# Patient Record
Sex: Female | Born: 1990 | Race: White | Hispanic: No | Marital: Single | Smoking: Current every day smoker
Health system: Southern US, Community
[De-identification: ages and names within clinical notes are randomized; demographics above are authoritative.]

## PROBLEM LIST (undated history)

## (undated) DIAGNOSIS — R000 Tachycardia, unspecified: Secondary | ICD-10-CM

## (undated) DIAGNOSIS — Z91410 Personal history of adult physical and sexual abuse: Secondary | ICD-10-CM

## (undated) DIAGNOSIS — F319 Bipolar disorder, unspecified: Secondary | ICD-10-CM

## (undated) DIAGNOSIS — F1911 Other psychoactive substance abuse, in remission: Secondary | ICD-10-CM

## (undated) DIAGNOSIS — Z8659 Personal history of other mental and behavioral disorders: Secondary | ICD-10-CM

## (undated) DIAGNOSIS — F502 Bulimia nervosa, unspecified: Secondary | ICD-10-CM

## (undated) DIAGNOSIS — R011 Cardiac murmur, unspecified: Secondary | ICD-10-CM

## (undated) DIAGNOSIS — D649 Anemia, unspecified: Secondary | ICD-10-CM

## (undated) DIAGNOSIS — R45851 Suicidal ideations: Secondary | ICD-10-CM

## (undated) DIAGNOSIS — F32A Depression, unspecified: Secondary | ICD-10-CM

## (undated) DIAGNOSIS — B977 Papillomavirus as the cause of diseases classified elsewhere: Secondary | ICD-10-CM

## (undated) DIAGNOSIS — Z973 Presence of spectacles and contact lenses: Secondary | ICD-10-CM

## (undated) DIAGNOSIS — F329 Major depressive disorder, single episode, unspecified: Secondary | ICD-10-CM

## (undated) DIAGNOSIS — F1010 Alcohol abuse, uncomplicated: Secondary | ICD-10-CM

## (undated) DIAGNOSIS — K219 Gastro-esophageal reflux disease without esophagitis: Secondary | ICD-10-CM

## (undated) DIAGNOSIS — F4310 Post-traumatic stress disorder, unspecified: Secondary | ICD-10-CM

## (undated) HISTORY — DX: Papillomavirus as the cause of diseases classified elsewhere: B97.7

## (undated) HISTORY — DX: Cardiac murmur, unspecified: R01.1

## (undated) HISTORY — DX: Personal history of other mental and behavioral disorders: Z86.59

## (undated) HISTORY — DX: Post-traumatic stress disorder, unspecified: F43.10

## (undated) HISTORY — DX: Other psychoactive substance abuse, in remission: F19.11

## (undated) HISTORY — PX: COLPOSCOPY: SHX161

## (undated) HISTORY — DX: Personal history of adult physical and sexual abuse: Z91.410

## (undated) HISTORY — DX: Alcohol abuse, uncomplicated: F10.10

## (undated) HISTORY — DX: Bulimia nervosa: F50.2

## (undated) HISTORY — DX: Bulimia nervosa, unspecified: F50.20

## (undated) HISTORY — PX: WISDOM TOOTH EXTRACTION: SHX21

---
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
## 2008-02-17 ENCOUNTER — Other Ambulatory Visit: Admission: RE | Admit: 2008-02-17 | Discharge: 2008-02-17 | Payer: Self-pay | Admitting: Family Medicine

## 2012-04-05 ENCOUNTER — Emergency Department (HOSPITAL_COMMUNITY)
Admission: EM | Admit: 2012-04-05 | Discharge: 2012-04-05 | Disposition: A | Discharge status: Home / Self Care | Payer: BC Managed Care – PPO | Source: Home / Self Care | Attending: Emergency Medicine | Admitting: Emergency Medicine

## 2012-04-05 ENCOUNTER — Encounter (HOSPITAL_COMMUNITY): Payer: Self-pay | Admitting: Emergency Medicine

## 2012-04-05 DIAGNOSIS — IMO0002 Reserved for concepts with insufficient information to code with codable children: Secondary | ICD-10-CM

## 2012-04-05 DIAGNOSIS — S86899A Other injury of other muscle(s) and tendon(s) at lower leg level, unspecified leg, initial encounter: Secondary | ICD-10-CM

## 2012-04-05 HISTORY — DX: Major depressive disorder, single episode, unspecified: F32.9

## 2012-04-05 HISTORY — DX: Depression, unspecified: F32.A

## 2012-04-05 MED ORDER — MELOXICAM 7.5 MG PO TABS: 7.5000 mg | ORAL_TABLET | Freq: Every day | ORAL | Status: DC

## 2012-04-05 NOTE — ED Provider Notes (Signed)
History     CSN: [id number]  Arrival date & time 04/05/12  1230   First MD Initiated Contact with Patient 04/05/12 1253      Chief Complaint  Patient presents with  . Leg Pain    (Consider location/radiation/quality/duration/timing/severity/associated sxs/prior treatment) HPI Comments: Patient presents to urgent care this afternoon complaining of right leg pain (patient points towards the anterior aspect of her right Tibial  Area) patient denies any injury, falls or recent gestures that could have explained a sudden pain in this area. She denies any swelling, calf pain, changes in color. 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  Patient is a 21 y.o. female presenting with leg pain. The history is provided by the patient.  Leg Pain  The incident occurred more than 2 days ago. The incident occurred at work. There was no injury mechanism. The pain is present in the right leg. The pain is at a severity of 5/10. The pain is moderate. The pain has been constant since onset. Associated symptoms include tingling. Pertinent negatives include no numbness, no loss of motion and no loss of sensation. Exacerbated by: Walking and activity. She has tried nothing for the symptoms. The treatment provided no relief.    Past Medical History  Diagnosis Date  . Depression     Past Surgical History  Procedure Date  . Wisdom tooth extraction     Family History  Problem Relation Age of Onset  . Cancer Mother   . Diabetes Father   . Hypertension Father   . Asthma Sister     History  Substance Use Topics  . Smoking status: Current Everyday Smoker    Types: Cigarettes  . Smokeless tobacco: Not on file  . Alcohol Use: 0.0 oz/week     daily    OB History    Grav Para Term Preterm Abortions TAB SAB Ect Mult Living                  Review of Systems  Constitutional: Negative for fever, diaphoresis, activity  change and appetite change.  Skin: Negative for color change, pallor, rash and wound.  Neurological: Positive for tingling. Negative for weakness and numbness.    Allergies  Review of patient's allergies indicates no known allergies.  Home Medications   Current Outpatient Rx  Name Route Sig Dispense Refill  . ESCITALOPRAM OXALATE 5 MG PO TABS Oral Take 5 mg by mouth daily.    Marland Kitchen ZOLPIDEM TARTRATE 5 MG PO TABS Oral Take 5 mg by mouth at bedtime as needed.    . MELOXICAM 7.5 MG PO TABS Oral Take 1 tablet (7.5 mg total) by mouth daily. 14 tablet 0    BP 120/80  Pulse 90  Temp 98.3 F (36.8 C) (Oral)  Resp 18  SpO2 98%  LMP 03/14/2012  Physical Exam  Nursing note and vitals reviewed. Constitutional: Vital signs are normal. She appears well-developed and well-nourished.    Musculoskeletal: She exhibits tenderness. She exhibits no edema.  Skin: No rash noted. No erythema.    ED Course  Procedures (including critical care time)  Labs Reviewed - No data to display No results found.   1. Shin splints       MDM  Patient right anterior T. feel focal discomfort. Consistent with a shin splint. No abnormalities were noted on her exam such as circumferential diameter increase, erythema or localized soft tissue swelling. Have  encouraged patient to take a meloxicam course for 2 weeks and to use an Ace wrap when she's working to provide minimal pressure. Was advised to followup with sports medicine orthopedic service if pain to increase or worsen despite this recommended measures.        Beth Molly, MD 04/05/12 2043

## 2012-04-05 NOTE — ED Notes (Signed)
Pt c/o right leg pain below the knee that started 8/19; denies injury; no swelling or redness noted to leg; pulses present in right foot.

## 2012-06-14 HISTORY — PX: OTHER SURGICAL HISTORY: SHX169

## 2012-09-14 HISTORY — PX: OTHER SURGICAL HISTORY: SHX169

## 2012-09-19 ENCOUNTER — Other Ambulatory Visit: Payer: Self-pay | Admitting: Obstetrics and Gynecology

## 2012-09-19 ENCOUNTER — Ambulatory Visit (HOSPITAL_COMMUNITY)
Admission: RE | Admit: 2012-09-19 | Discharge: 2012-09-19 | Disposition: A | Discharge status: Home / Self Care | Payer: BC Managed Care – PPO | Source: Ambulatory Visit | Attending: Obstetrics and Gynecology | Admitting: Obstetrics and Gynecology

## 2012-09-19 DIAGNOSIS — R58 Hemorrhage, not elsewhere classified: Secondary | ICD-10-CM

## 2012-09-19 DIAGNOSIS — IMO0002 Reserved for concepts with insufficient information to code with codable children: Secondary | ICD-10-CM | POA: Insufficient documentation

## 2012-11-21 ENCOUNTER — Ambulatory Visit: Payer: Self-pay | Admitting: Obstetrics and Gynecology

## 2012-11-22 ENCOUNTER — Ambulatory Visit: Payer: Self-pay | Admitting: Obstetrics and Gynecology

## 2012-11-25 ENCOUNTER — Ambulatory Visit (INDEPENDENT_AMBULATORY_CARE_PROVIDER_SITE_OTHER): Payer: BC Managed Care – PPO | Admitting: Obstetrics and Gynecology

## 2012-11-25 ENCOUNTER — Encounter: Payer: Self-pay | Admitting: Obstetrics and Gynecology

## 2012-11-25 VITALS — BP 100/64 | Ht 62.0 in | Wt 113.0 lb

## 2012-11-25 DIAGNOSIS — Z01419 Encounter for gynecological examination (general) (routine) without abnormal findings: Secondary | ICD-10-CM
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
## 2012-11-25 NOTE — Patient Instructions (Signed)

## 2012-11-26 NOTE — Addendum Note (Signed)
Addended by: Clide Dales R on: 11/26/2012 02:22 PM   Modules accepted: Orders

## 2012-11-28 LAB — HEMOGLOBIN, FINGERSTICK: Hemoglobin, fingerstick: 13.9 g/dL (ref 12.0–16.0)

## 2012-12-23 ENCOUNTER — Encounter: Payer: Self-pay | Admitting: Obstetrics and Gynecology

## 2012-12-23 ENCOUNTER — Telehealth: Payer: Self-pay | Admitting: Obstetrics and Gynecology

## 2012-12-23 ENCOUNTER — Ambulatory Visit (INDEPENDENT_AMBULATORY_CARE_PROVIDER_SITE_OTHER): Payer: BC Managed Care – PPO | Admitting: Obstetrics and Gynecology

## 2012-12-23 VITALS — BP 100/68 | Wt 113.0 lb

## 2012-12-23 DIAGNOSIS — N6310 Unspecified lump in the right breast, unspecified quadrant: Secondary | ICD-10-CM

## 2012-12-23 DIAGNOSIS — N926 Irregular menstruation, unspecified: Secondary | ICD-10-CM

## 2012-12-23 DIAGNOSIS — N63 Unspecified lump in unspecified breast: Secondary | ICD-10-CM

## 2012-12-23 LAB — CBC
Hemoglobin: 13.2 g/dL (ref 12.0–15.0)
MCH: 29.3 pg (ref 26.0–34.0)
RBC: 4.51 MIL/uL (ref 3.87–5.11)
WBC: 6.7 10*3/uL (ref 4.0–10.5)

## 2012-12-23 MED ORDER — ESTRADIOL 1 MG PO TABS: 2.0000 mg | ORAL_TABLET | Freq: Every day | ORAL | Status: DC

## 2012-12-23 NOTE — Progress Notes (Signed)
Patient ID: Beth [name], female   DOB: 12-07-1990, 22 y.o.   MRN: [id number]  Subjective  22 year old G7P0010 Caucasian female with Nexplanon inserted in November who presents with bleeding since last office visit 11/25/12.  Heavy bleeding and clotting started 5 days ago.  Some cramping.  No pain medication used.  Had a normal pelvic ultrasound on 09/19/12, which was ordered in follow up to a dilation and curettage performed in [place] for retained products of conception following a pregnancy termination.  Last sexual activity 2 weeks ago.  Female partner.  Not using condoms.  Steady partner for three years.  Latex allergy.  Has used OCPS in the past.  Has not used the NuvaRing.    Noted a right breast lump last week.  No history of fibrocystic breasts.    Objective  Breast exam - No dominant masses, retractions, nipple discharge, or axillary adenopathy bilaterally. Pelvic exam - erythema of vulva (outlines contact are for sanitary pad).  Cervix and vagina without lesions.  Small amount of red blood in the vagina.  No CMT.  Small and nontender uterus.  No adnexal masses or tenderness.  UPT - negative.  Assessment  Menometrorrhagia. Nexplanon patient. Normal breast exam.  Plan  Estrace 2 mg po daily for 10 days.   CBC now. Patient will call if bleeding does not improve by the end of the course of estrogen.   If bleeding persists, patient will likely ask for removal of the Nexplanon. We have already discussed the possibility of Ortho Evra patch if she decides to have the Nexplanon removed.

## 2012-12-23 NOTE — Telephone Encounter (Signed)
Nexplanon--patient states she has been bleeding for over a month and wants to come in for an appointment. Please advise.

## 2012-12-23 NOTE — Patient Instructions (Signed)

## 2012-12-23 NOTE — Telephone Encounter (Signed)
Spoke with pt who has had Nexplanon since November. Pt states she has had some bleeding for about a month. At the beginning it was spotting, then heavier bleeding for a few days, now it is getting lighter like a normal period. Pt requesting OV. Sched OV today at 3:30 per pt request.

## 2012-12-25 ENCOUNTER — Telehealth: Payer: Self-pay

## 2012-12-25 NOTE — Telephone Encounter (Signed)
LMOVM to call to discuss test results.

## 2012-12-25 NOTE — Telephone Encounter (Signed)
Patient is returning phone call to [place].

## 2012-12-25 NOTE — Telephone Encounter (Signed)
Message copied by Alphonsa Overall on Wed Dec 25, 2012 10:21 AM ------      Message from: Conley Simmonds      Created: Tue Dec 24, 2012  6:40 PM       Please report normal hemoglobin to patient.  No anemia. ------

## 2012-12-25 NOTE — Telephone Encounter (Signed)
Pt. Notified hemoglobin normal.

## 2013-01-08 NOTE — Telephone Encounter (Signed)
LEFT MESSAGE ON CB# OF NEED TO RETURN CALL CONCERNING OCT.

## 2013-01-08 NOTE — Telephone Encounter (Signed)
Pt wants to switch B/C

## 2013-01-09 NOTE — Telephone Encounter (Signed)
PATIENT STATES ESTROGEN PILLS HELPED FOR ABOUT 4 DAYS AND THEN THE BLEEDING VAGINALLY STARTED AGAIN AND IS WORSE WITH CRAMPING . APPOINTMENT MADE WITH D. LEONARD ON Tuesday June 3RD. SUE

## 2013-01-14 ENCOUNTER — Ambulatory Visit (INDEPENDENT_AMBULATORY_CARE_PROVIDER_SITE_OTHER): Payer: BC Managed Care – PPO | Admitting: Certified Nurse Midwife

## 2013-01-14 VITALS — BP 90/60 | HR 72 | Resp 16

## 2013-01-14 DIAGNOSIS — N938 Other specified abnormal uterine and vaginal bleeding: Secondary | ICD-10-CM

## 2013-01-14 DIAGNOSIS — Z3009 Encounter for other general counseling and advice on contraception: Secondary | ICD-10-CM

## 2013-01-14 DIAGNOSIS — N949 Unspecified condition associated with female genital organs and menstrual cycle: Secondary | ICD-10-CM

## 2013-01-14 NOTE — Progress Notes (Signed)
22 yo. g1 p0010 single white female here for discussion of removing Nexplanon and discussing other option for contraception.Continues bleeding with Nexplanon which was inserted 11-13, not happy with bleeding profile.  Had retained POC after pregnancy termination with D&C done  In [place]. Patient was anemic at that time, but has resolved.  Was treated 2 weeks ago for excessive bleeding again with Estrace 2 mg daily for 10 days. Bleeding did change for four days with decrease near the end of usage. Patient now at normal  period time with lighter flow right now. Interested in having Nexplanon removed and possibly using Ortho Evra patch, not interested in Nuvaring and had non compliance with pills which resulted in pregnancy.  O: Healthy female WD WN Affect: normal, orientation x 3 Declines pelvic exam ( normal exam 12-23-12 here)  A: Contraception Nexplanon with continued DUB with some anemia, corrected with iron supplementation. 2-Trial of Estrace 2mg  with some change in DUB 3-Desires other options for contraception  P: Discussed trial of Doxycycline which has been used for treatment of DUB with Nexplanon and effect on uterine lining. Patient declines at this point.  Wants to see if period will change at the end of this cycle. 3-Discussed Ortho Evra risks and benefits with hand out and need to be compliant to avoid unwanted pregnancy. Given handout.  Patient plans to continue with current method and assess bleeding, if continues, will schedule removal of nexplanon and start Ortho Evra.   Rv prn  36 minutes spent with patient with >50% of time spent in face to face counseling.  Reviewed, TL

## 2013-04-21 ENCOUNTER — Telehealth: Payer: Self-pay | Admitting: Certified Nurse Midwife

## 2013-04-21 NOTE — Telephone Encounter (Signed)
Spoke with pt who is ready to have Nexplanon removed and start Ortho Evra patch. Advised DL does not remove Nexplanon, and pt agreeable to see BS. Advised Carolynn from insurance would be calling to let her know OOP cost. Scheduled appt 04-24-13 at 8 am. Pt wondering if she can work that day after having it done. Advised pt it is fairly simple to remove, with a local injection to numb the site and a stitch perhaps to close it with a band aid over it. Advised pt she should be able to work that day. Pt agreeable.

## 2013-04-21 NOTE — Telephone Encounter (Signed)
LVM advising $35 copay for nexplanon removal.

## 2013-04-21 NOTE — Telephone Encounter (Signed)
Patient has been on ger period for 2 weeks and wants an appointment to remove Nexplanon. Patient wants to Beth [name].

## 2013-04-24 ENCOUNTER — Encounter: Payer: Self-pay | Admitting: Obstetrics and Gynecology

## 2013-04-24 ENCOUNTER — Ambulatory Visit (INDEPENDENT_AMBULATORY_CARE_PROVIDER_SITE_OTHER): Payer: BC Managed Care – PPO | Admitting: Obstetrics and Gynecology

## 2013-04-24 VITALS — BP 100/66 | HR 88 | Ht 62.0 in | Wt 113.0 lb

## 2013-04-24 DIAGNOSIS — N921 Excessive and frequent menstruation with irregular cycle: Secondary | ICD-10-CM

## 2013-04-24 LAB — POCT URINE PREGNANCY: Preg Test, Ur: NEGATIVE

## 2013-04-24 MED ORDER — ETONOGESTREL-ETHINYL ESTRADIOL 0.12-0.015 MG/24HR VA RING: VAGINAL_RING | VAGINAL | Status: DC

## 2013-04-24 NOTE — Addendum Note (Signed)
Addended by: Alphonsa Overall on: 04/24/2013 09:32 AM   Modules accepted: Orders
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
## 2013-04-24 NOTE — Patient Instructions (Signed)

## 2013-07-24 ENCOUNTER — Ambulatory Visit (INDEPENDENT_AMBULATORY_CARE_PROVIDER_SITE_OTHER): Payer: BC Managed Care – PPO | Admitting: Obstetrics and Gynecology

## 2013-07-24 ENCOUNTER — Encounter: Payer: Self-pay | Admitting: Obstetrics and Gynecology

## 2013-07-24 VITALS — BP 100/60 | HR 100 | Ht 62.0 in | Wt 118.0 lb

## 2013-07-24 DIAGNOSIS — Z3040 Encounter for surveillance of contraceptives, unspecified: Secondary | ICD-10-CM

## 2013-07-24 MED ORDER — ETONOGESTREL-ETHINYL ESTRADIOL 0.12-0.015 MG/24HR VA RING: VAGINAL_RING | VAGINAL | Status: DC

## 2013-07-24 NOTE — Progress Notes (Signed)
Patient ID: CINDEL DAUGHERTY, female   DOB: [birth year], 22 y.o.   MRN: [id number]  Subjective  Patient is here for a recheck. Nexplanon removed 3 months ago due to metrorrhagia. Patient started NuvaRing and is very satisfied with her bleeding profile now.  LMP 07/23/13. Patient states she has her menses for four days per month.  Menses last for four days.   Patient likes the predictability of menses. Had heavy bleeding last night and stained the sheets.  Heavy for only one or two days. Pad change two or three times a day. Some cramping. Does not need medication.  Sexually active. Ring does not come out.   BP 100/60  P 100  No exam.  Assessment  Metrorrhagia, resolved. Doing well on NuvaRing.  Plan  Continue to use NuvaRing as directed.  Refills for 6 months in Epic. Can try NSAID, Aleve or Advil during menses to reduce menstrual flow. Follow up in April for annual exam.  10 minutes face to face time of which over 50% was spent in counseling.

## 2013-11-26 ENCOUNTER — Encounter: Payer: Self-pay | Admitting: Obstetrics and Gynecology

## 2013-11-26 ENCOUNTER — Ambulatory Visit (INDEPENDENT_AMBULATORY_CARE_PROVIDER_SITE_OTHER): Payer: BC Managed Care – PPO | Admitting: Obstetrics and Gynecology

## 2013-11-26 VITALS — BP 118/72 | HR 100 | Resp 18 | Ht 62.0 in | Wt 119.0 lb

## 2013-11-26 DIAGNOSIS — Z01419 Encounter for gynecological examination (general) (routine) without abnormal findings: Secondary | ICD-10-CM

## 2013-11-26 DIAGNOSIS — Z309 Encounter for contraceptive management, unspecified: Secondary | ICD-10-CM

## 2013-11-26 DIAGNOSIS — Z0000 Encounter for general adult medical examination without abnormal findings: Secondary | ICD-10-CM

## 2013-11-26 DIAGNOSIS — Z113 Encounter for screening for infections with a predominantly sexual mode of transmission: Secondary | ICD-10-CM

## 2013-11-26 DIAGNOSIS — R319 Hematuria, unspecified: Secondary | ICD-10-CM

## 2013-11-26 LAB — POCT URINALYSIS DIPSTICK
BILIRUBIN UA: NEGATIVE
Glucose, UA: NEGATIVE
KETONES UA: NEGATIVE
Leukocytes, UA: NEGATIVE
Nitrite, UA: NEGATIVE
PH UA: 5
Protein, UA: NEGATIVE
Urobilinogen, UA: NEGATIVE

## 2013-11-26 LAB — POCT URINE PREGNANCY: Preg Test, Ur: NEGATIVE

## 2013-11-26 LAB — HEMOGLOBIN, FINGERSTICK: Hemoglobin, fingerstick: 13.8 g/dL (ref 12.0–16.0)

## 2013-11-26 MED ORDER — ETONOGESTREL-ETHINYL ESTRADIOL 0.12-0.015 MG/24HR VA RING: VAGINAL_RING | VAGINAL | Status: DC
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
## 2013-11-26 NOTE — Patient Instructions (Signed)

## 2013-11-27 ENCOUNTER — Ambulatory Visit: Payer: BC Managed Care – PPO | Admitting: Obstetrics and Gynecology

## 2013-11-27 LAB — URINE CULTURE
Colony Count: NO GROWTH
ORGANISM ID, BACTERIA: NO GROWTH

## 2013-11-27 LAB — GC/CHLAMYDIA PROBE AMP, URINE
CHLAMYDIA, SWAB/URINE, PCR: NEGATIVE
GC PROBE AMP, URINE: NEGATIVE

## 2013-11-27 LAB — URINALYSIS, MICROSCOPIC ONLY
Bacteria, UA: NONE SEEN
CASTS: NONE SEEN
SQUAMOUS EPITHELIAL / LPF: NONE SEEN

## 2013-11-27 LAB — STD PANEL
HEP B S AG: NEGATIVE
HIV: NONREACTIVE

## 2013-11-27 LAB — HEPATITIS C ANTIBODY: HCV Ab: NEGATIVE

## 2013-11-28 LAB — IPS PAP TEST WITH REFLEX TO HPV

## 2014-05-11 IMAGING — US US TRANSVAGINAL NON-OB
1 series · 13 of 25 positions shown · non-contrast
Comparison: None

CLINICAL DATA: Status post abortion on 06/20/2012 with persistent
bleeding and emergency D&C on 09/14/2012 for presumed retained
products of conception.



[Series 1: us pelvis complete · 13 of 45 slices shown]
[im 1/45]
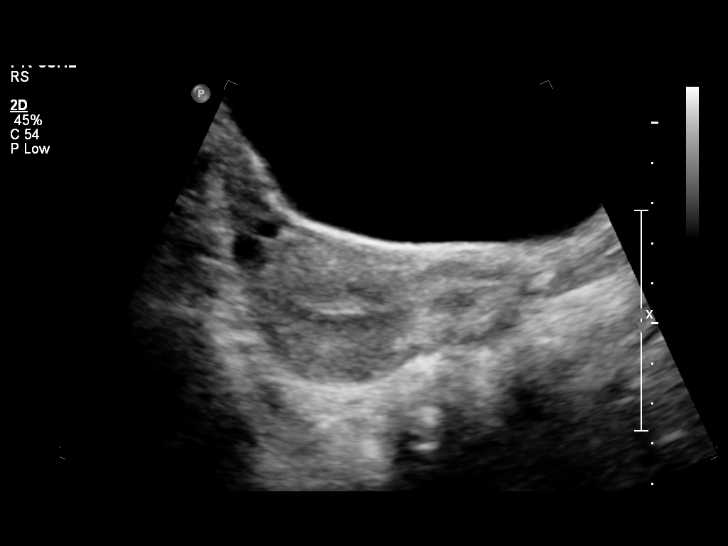
[im 4/45]
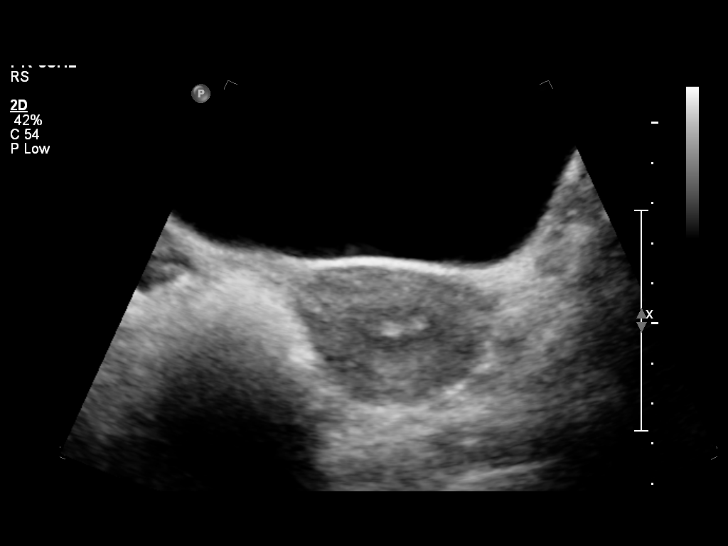
[im 8/45]
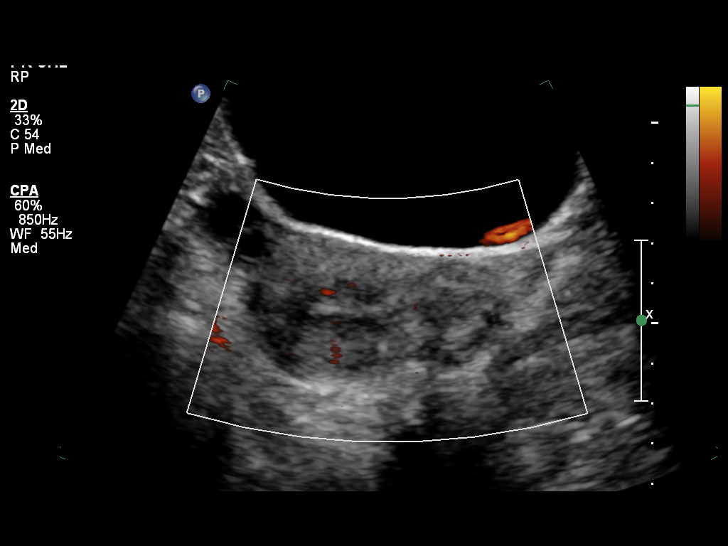
[im 12/45]
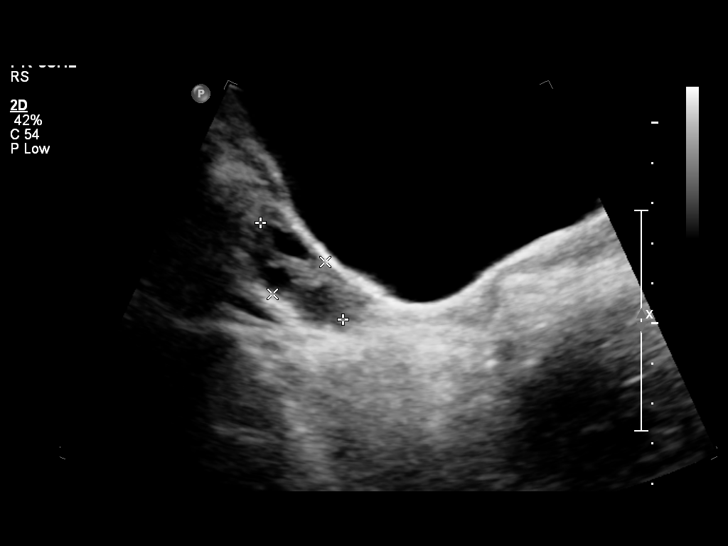
[im 15/45]
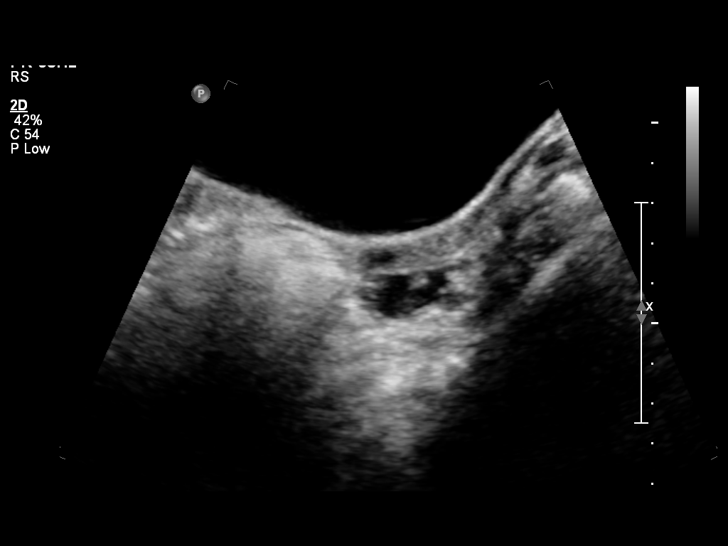
[im 19/45]
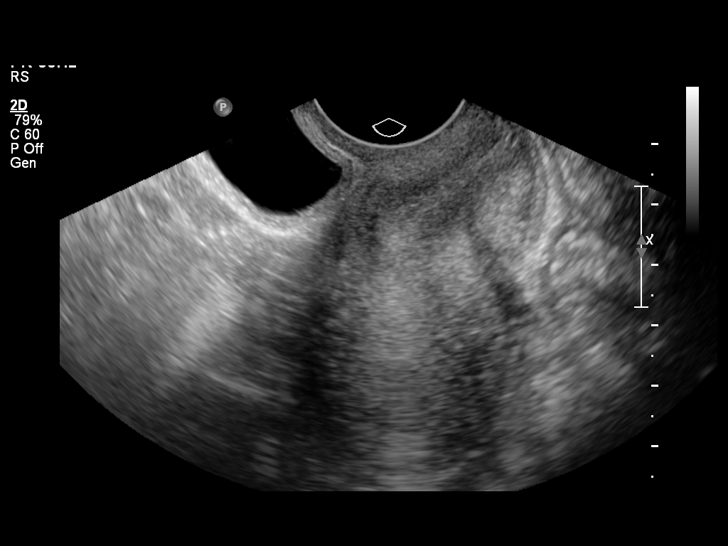
[im 23/45]
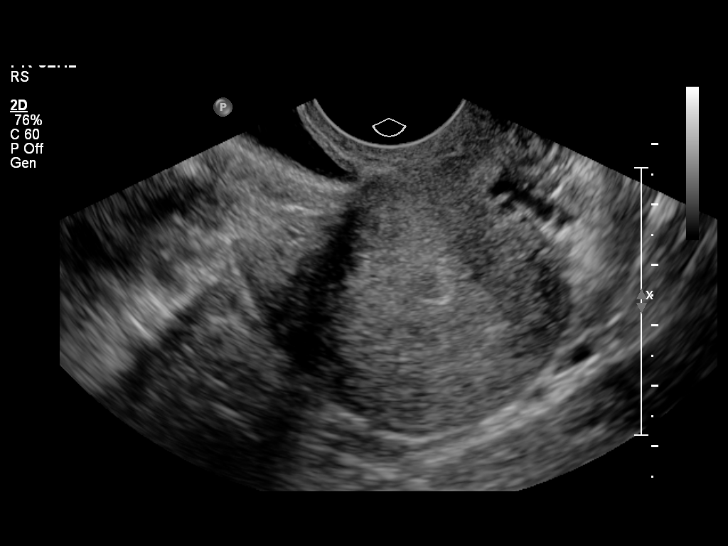
[im 26/45]
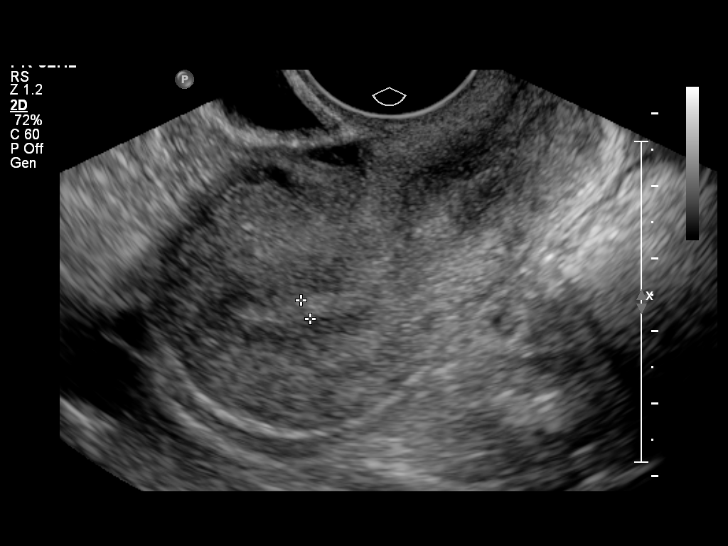
[im 30/45]
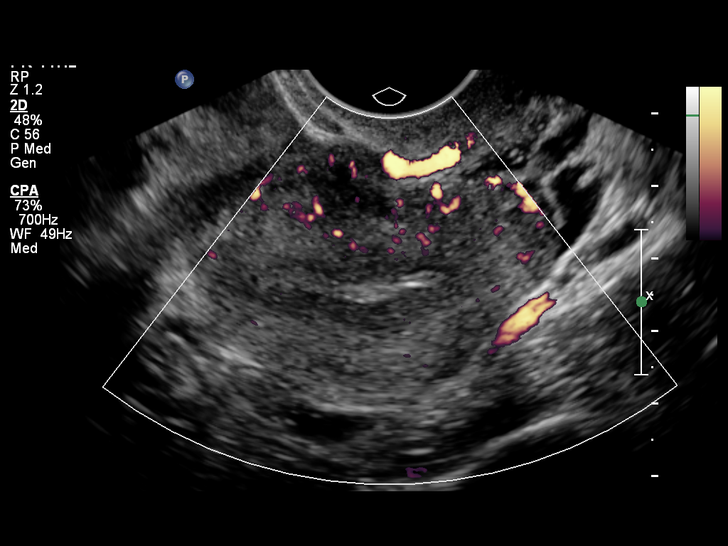
[im 34/45]
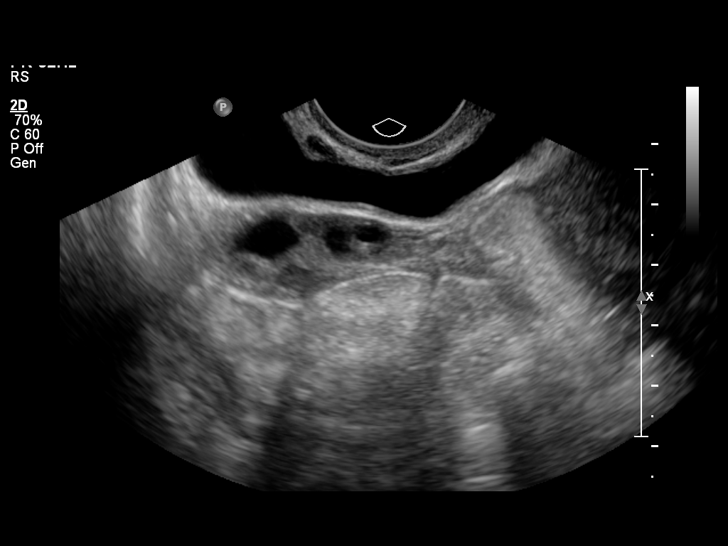
[im 37/45]
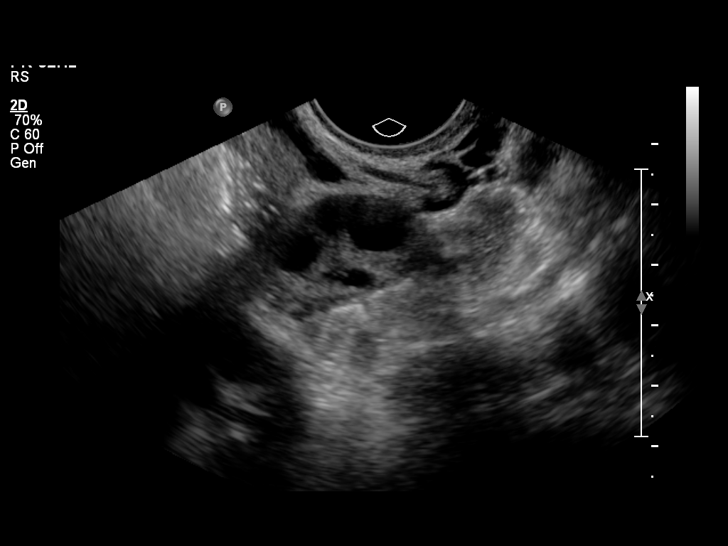
[im 41/45]
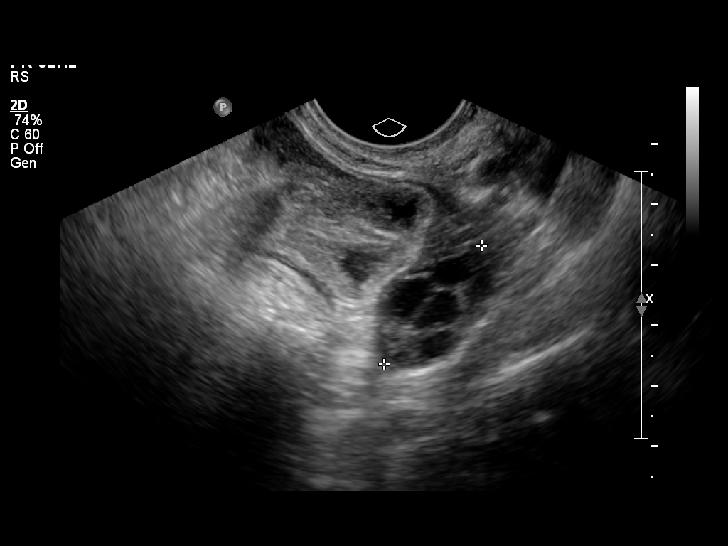
[im 45/45]
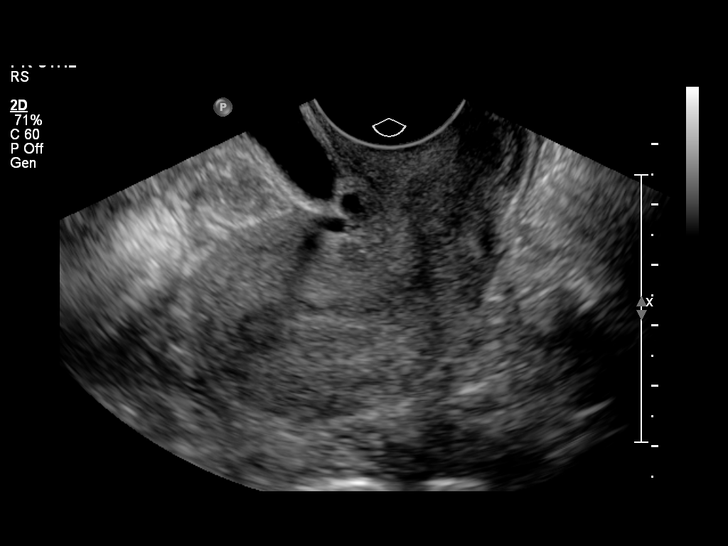

[13 of 25 positions shown; findings below may reference images not displayed]

FINDINGS: Uterus: Is anteverted and anteflexed and demonstrates a sagittal
length of 6.4 cm depth of 3.5 cm and width of 4.6 cm.  A
homogeneous myometrium is seen

Endometrium: Appears thin and echogenic with a width of 2.8 mm.  No
areas of focal thickening or heterogeneity are seen.  The
transitional zone appears maintained

Right ovary:  Measures 2.8 x 1.8 x 2.6 cm and has a normal
appearance

Left ovary: Measures 2.7 x 1.6 x 2.5 cm and has a normal appearance

Other findings: No pelvic fluid or separate adnexal masses are
seen.
IMPRESSION: Normal pelvic ultrasound with no focal endometrial abnormality
noted..

## 2014-06-10 ENCOUNTER — Telehealth: Payer: Self-pay | Admitting: Obstetrics and Gynecology

## 2014-06-10 NOTE — Telephone Encounter (Signed)
Left msg to call regarding cancelled appt.

## 2014-06-15 ENCOUNTER — Encounter: Payer: Self-pay | Admitting: Obstetrics and Gynecology

## 2014-12-02 ENCOUNTER — Ambulatory Visit: Payer: BC Managed Care – PPO | Admitting: Obstetrics and Gynecology

## 2014-12-03 ENCOUNTER — Ambulatory Visit: Payer: BC Managed Care – PPO | Admitting: Obstetrics and Gynecology

## 2014-12-03 ENCOUNTER — Telehealth: Payer: Self-pay | Admitting: Obstetrics and Gynecology

## 2014-12-03 NOTE — Telephone Encounter (Signed)
Patient rescheduled her aex appointment today with Dr.Silva, staff msg sent to Dr.Silva.

## 2014-12-07 ENCOUNTER — Other Ambulatory Visit: Payer: Self-pay | Admitting: Obstetrics and Gynecology

## 2014-12-07 NOTE — Telephone Encounter (Signed)
Medication refill request: Nuvaring Last AEX:  11/26/13 Dr. Edward JollySilva Next AEX: 12/16/14 Dr. Edward JollySilva Last MMG (if hormonal medication request): none Refill authorized: 11/26/13 #3each/ 3 Refills. Today  #1each/ 0R?

## 2014-12-16 ENCOUNTER — Ambulatory Visit: Payer: BC Managed Care – PPO | Admitting: Obstetrics and Gynecology

## 2014-12-16 ENCOUNTER — Other Ambulatory Visit: Payer: Self-pay | Admitting: Obstetrics and Gynecology

## 2014-12-16 NOTE — Telephone Encounter (Signed)
Patient cancelled her AEX at her appointment today. She rescheduled to 02/24/15 with Dr. Edward JollySilva. Separate staff message sent to Dr. Edward JollySilva. Okay to close encounter?

## 2014-12-16 NOTE — Telephone Encounter (Signed)
12/07/14 #1 ring/0 rfs was sent to pharmacy-Denied.

## 2015-01-12 ENCOUNTER — Other Ambulatory Visit: Payer: Self-pay | Admitting: Obstetrics and Gynecology

## 2015-01-12 NOTE — Telephone Encounter (Signed)
Patient needs to keep her appointment or she will not receive any further refills.  She has cancelled several appointments.

## 2015-01-12 NOTE — Telephone Encounter (Signed)
Medication refill request: Nuvaring  Last AEX:  11/26/13 Dr. Edward JollySilva Next AEX: 02/24/15 Dr. Edward JollySilva Last MMG (if hormonal medication request): None Refill authorized: 12/07/14 #1 each w/0R. Today #1 /1R?

## 2015-01-12 NOTE — Telephone Encounter (Signed)
Routed to Dr. Miller

## 2015-01-12 NOTE — Telephone Encounter (Signed)
Patient calling to check status of refill. Patient requesting ASAP.

## 2015-01-13 NOTE — Telephone Encounter (Signed)
Left Voicemail for pt with message bellow - [facility]

## 2015-02-24 ENCOUNTER — Ambulatory Visit (INDEPENDENT_AMBULATORY_CARE_PROVIDER_SITE_OTHER): Payer: BC Managed Care – PPO | Admitting: Obstetrics and Gynecology

## 2015-02-24 ENCOUNTER — Telehealth: Payer: Self-pay | Admitting: Obstetrics and Gynecology

## 2015-02-24 NOTE — Telephone Encounter (Signed)
[facility]   Cc- Starla Curl

## 2015-02-24 NOTE — Telephone Encounter (Signed)
Patient cancelled appointment. Staff message sent to Dr Edward JollySilva.

## 2015-02-25 ENCOUNTER — Ambulatory Visit: Payer: BC Managed Care – PPO | Admitting: Obstetrics and Gynecology

## 2015-02-25 ENCOUNTER — Other Ambulatory Visit: Payer: Self-pay | Admitting: Obstetrics and Gynecology

## 2015-02-25 MED ORDER — ETONOGESTREL-ETHINYL ESTRADIOL 0.12-0.015 MG/24HR VA RING: 1.0000 | VAGINAL_RING | VAGINAL | Status: DC

## 2015-02-25 NOTE — Telephone Encounter (Signed)
Patient calling requesting a refill on her birth control. Separate staff message to Dr. Edward JollySilva.

## 2015-02-25 NOTE — Telephone Encounter (Signed)
Medication refill request: Nuvaring Last AEX:  11/26/13 with BS Next AEX: No AEX scheduled  Last MMG (if hormonal medication request): n/a Refill authorized: Please advise.

## 2015-02-25 NOTE — Telephone Encounter (Signed)
LM on patient's vm that rx has been [facility].  Encounter closed.

## 2015-02-25 NOTE — Telephone Encounter (Signed)
I already put in a refill for one month of NuvaRing.

## 2015-03-04 ENCOUNTER — Telehealth: Payer: Self-pay | Admitting: Nurse Practitioner

## 2015-03-04 ENCOUNTER — Encounter: Payer: Self-pay | Admitting: Obstetrics and Gynecology

## 2015-03-04 ENCOUNTER — Other Ambulatory Visit: Payer: Self-pay | Admitting: Obstetrics and Gynecology

## 2015-03-04 ENCOUNTER — Ambulatory Visit (INDEPENDENT_AMBULATORY_CARE_PROVIDER_SITE_OTHER): Payer: BLUE CROSS/BLUE SHIELD | Admitting: Obstetrics and Gynecology

## 2015-03-04 VITALS — BP 110/78 | HR 104 | Resp 14 | Ht 61.75 in | Wt 130.0 lb

## 2015-03-04 DIAGNOSIS — Z01419 Encounter for gynecological examination (general) (routine) without abnormal findings: Secondary | ICD-10-CM

## 2015-03-04 DIAGNOSIS — N762 Acute vulvitis: Secondary | ICD-10-CM | POA: Diagnosis not present

## 2015-03-04 DIAGNOSIS — Z3049 Encounter for surveillance of other contraceptives: Secondary | ICD-10-CM | POA: Diagnosis not present

## 2015-03-04 DIAGNOSIS — Z0000 Encounter for general adult medical examination without abnormal findings: Secondary | ICD-10-CM

## 2015-03-04 DIAGNOSIS — Z113 Encounter for screening for infections with a predominantly sexual mode of transmission: Secondary | ICD-10-CM

## 2015-03-04 LAB — POCT URINALYSIS DIPSTICK
Bilirubin, UA: NEGATIVE
GLUCOSE UA: NEGATIVE
KETONES UA: NEGATIVE
LEUKOCYTES UA: NEGATIVE
Nitrite, UA: NEGATIVE
PH UA: 7
PROTEIN UA: NEGATIVE
SPEC GRAV UA: 1.025
UROBILINOGEN UA: NEGATIVE

## 2015-03-04 LAB — LIPID PANEL
Cholesterol: 179 mg/dL (ref 0–200)
HDL: 73 mg/dL (ref >=46)
LDL Cholesterol: 65 mg/dL (ref 0–99)
TRIGLYCERIDES: 204 mg/dL — AB (ref <150)
Total CHOL/HDL Ratio: 2.5 Ratio
VLDL: 41 mg/dL — ABNORMAL HIGH (ref 0–40)

## 2015-03-04 LAB — HEPATITIS C ANTIBODY: HCV Ab: NEGATIVE

## 2015-03-04 MED ORDER — BETAMETHASONE VALERATE 0.1 % EX OINT: TOPICAL_OINTMENT | CUTANEOUS | Status: DC

## 2015-03-04 MED ORDER — ETONOGESTREL-ETHINYL ESTRADIOL 0.12-0.015 MG/24HR VA RING: 1.0000 | VAGINAL_RING | VAGINAL | Status: DC

## 2015-03-04 NOTE — Telephone Encounter (Signed)
02/25/15 #1/0 rfs was sent to [facility] [address]- rx denied.

## 2015-03-04 NOTE — Telephone Encounter (Signed)
Made in error please disregard.
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
## 2015-03-04 NOTE — Patient Instructions (Signed)

## 2015-03-05 ENCOUNTER — Telehealth: Payer: Self-pay

## 2015-03-05 LAB — STD PANEL
HIV 1&2 Ab, 4th Generation: NONREACTIVE
Hepatitis B Surface Ag: NEGATIVE

## 2015-03-05 LAB — GC/CHLAMYDIA PROBE AMP
CT Probe RNA: NEGATIVE
GC Probe RNA: NEGATIVE

## 2015-03-05 LAB — WET PREP BY MOLECULAR PROBE
CANDIDA SPECIES: NEGATIVE
GARDNERELLA VAGINALIS: NEGATIVE
TRICHOMONAS VAG: NEGATIVE

## 2015-03-05 MED ORDER — NYSTATIN-TRIAMCINOLONE 100000-0.1 UNIT/GM-% EX CREA: 1.0000 "application " | TOPICAL_CREAM | Freq: Two times a day (BID) | CUTANEOUS | Status: DC

## 2015-03-05 NOTE — Telephone Encounter (Signed)
Spoke with patient. Advised of results as seen below from Dr.Silva. Patient is agreeable and verbalizes understanding. Rx for Mycolog II apply bid for 1 [facility] [address] [facility] request. Patient is agreeable.  Routing to provider for final review. Patient agreeable to disposition. Will close encounter.   Patient aware provider will review message and nurse will return call if any additional advice or change of disposition.

## 2015-03-05 NOTE — Telephone Encounter (Signed)
Affirm is negative.  GC/CT are pending.   OK for Mycolog II which has antifungal and steroid cream in it.  Apply bid for one week.  Dispense one tube, RF none.  Please send to pharmacy of choice.

## 2015-03-05 NOTE — Telephone Encounter (Signed)
Routing to Dr.Silva for review and advise of results from 03/04/2015.

## 2015-03-05 NOTE — Telephone Encounter (Signed)
Left message to call Mohamed Portlock at [phone]. 

## 2015-03-05 NOTE — Telephone Encounter (Signed)
-----   Message from Romualdo Bolk, MD sent at 03/04/2015 12:39 PM EDT ----- Can you please check the results of her wet prep probe tomorrow, she is uncomfortable. You can call me if needed.

## 2015-08-31 ENCOUNTER — Encounter: Payer: Self-pay | Admitting: Obstetrics and Gynecology

## 2015-09-09 ENCOUNTER — Encounter: Payer: Self-pay | Admitting: Obstetrics and Gynecology

## 2015-09-09 ENCOUNTER — Ambulatory Visit (INDEPENDENT_AMBULATORY_CARE_PROVIDER_SITE_OTHER): Payer: BLUE CROSS/BLUE SHIELD | Admitting: Obstetrics and Gynecology

## 2015-09-09 VITALS — BP 98/60 | HR 80 | Resp 16 | Wt 134.0 lb

## 2015-09-09 DIAGNOSIS — Z3009 Encounter for other general counseling and advice on contraception: Secondary | ICD-10-CM | POA: Diagnosis not present

## 2015-09-09 DIAGNOSIS — N898 Other specified noninflammatory disorders of vagina: Secondary | ICD-10-CM | POA: Diagnosis not present

## 2015-09-09 DIAGNOSIS — Z113 Encounter for screening for infections with a predominantly sexual mode of transmission: Secondary | ICD-10-CM

## 2015-09-09 DIAGNOSIS — F1010 Alcohol abuse, uncomplicated: Secondary | ICD-10-CM

## 2015-09-09 DIAGNOSIS — R350 Frequency of micturition: Secondary | ICD-10-CM | POA: Diagnosis not present

## 2015-09-09 DIAGNOSIS — L293 Anogenital pruritus, unspecified: Secondary | ICD-10-CM

## 2015-09-09 LAB — POCT URINALYSIS DIPSTICK
BILIRUBIN UA: NEGATIVE
Glucose, UA: NEGATIVE
KETONES UA: NEGATIVE
Leukocytes, UA: NEGATIVE
Nitrite, UA: NEGATIVE
PH UA: 6.5
Protein, UA: NEGATIVE
RBC UA: NEGATIVE
Urobilinogen, UA: NEGATIVE

## 2015-09-09 MED ORDER — BETAMETHASONE VALERATE 0.1 % EX OINT: 1.0000 "application " | TOPICAL_OINTMENT | Freq: Two times a day (BID) | CUTANEOUS | Status: DC

## 2015-09-09 MED ORDER — MISOPROSTOL 200 MCG PO TABS
ORAL_TABLET | ORAL | Status: DC
Start: 2015-09-09 — End: 2015-10-28

## 2015-09-09 NOTE — Patient Instructions (Signed)
Intrauterine Device Insertion Most often, an intrauterine device (IUD) is inserted into the uterus to prevent pregnancy. There are 2 types of IUDs available:  Copper IUD--This type of IUD creates an environment that is not favorable to sperm survival. The mechanism of action of the copper IUD is not known for certain. It can stay in place for 10 years.  Hormone IUD--This type of IUD contains the hormone progestin (synthetic progesterone). The progestin thickens the cervical mucus and prevents sperm from entering the uterus, and it also thins the uterine lining. There is no evidence that the hormone IUD prevents implantation. One hormone IUD can stay in place for up to 5 years, and a different hormone IUD can stay in place for up to 3 years. An IUD is the most cost-effective birth control if left in place for the full duration. It may be removed at any time. LET YOUR HEALTH CARE PROVIDER KNOW ABOUT:  Any allergies you have.  All medicines you are taking, including vitamins, herbs, eye drops, creams, and over-the-counter medicines.  Previous problems you or members of your family have had with the use of anesthetics.  Any blood disorders you have.  Previous surgeries you have had.  Possibility of pregnancy.  Medical conditions you have. RISKS AND COMPLICATIONS  Generally, intrauterine device insertion is a safe procedure. However, as with any procedure, complications can occur. Possible complications include:  Accidental puncture (perforation) of the uterus.  Accidental placement of the IUD either in the muscle layer of the uterus (myometrium) or outside the uterus. If this happens, the IUD can be found essentially floating around the bowels and must be taken out surgically.  The IUD may fall out of the uterus (expulsion). This is more common in women who have recently had a child.   Pregnancy in the fallopian tube (ectopic).  Pelvic inflammatory disease (PID), which is infection of  the uterus and fallopian tubes. The risk of PID is slightly increased in the first 20 days after the IUD is placed, but the overall risk is still very low. BEFORE THE PROCEDURE  Schedule the IUD insertion for when you will have your menstrual period or right after, to make sure you are not pregnant. Placement of the IUD is better tolerated shortly after a menstrual cycle.  You may need to take tests or be examined to make sure you are not pregnant.  You may be required to take a pregnancy test.  You may be required to get checked for sexually transmitted infections (STIs) prior to placement. Placing an IUD in someone who has an infection can make the infection worse.  You may be given a pain reliever to take 1 or 2 hours before the procedure.  An exam will be performed to determine the size and position of your uterus.  Ask your health care provider about changing or stopping your regular medicines. PROCEDURE   A tool (speculum) is placed in the vagina. This allows your health care provider to see the lower part of the uterus (cervix).  The cervix is prepped with a medicine that lowers the risk of infection.  You may be given a medicine to numb each side of the cervix (intracervical or paracervical block). This is used to block and control any discomfort with insertion.  A tool (uterine sound) is inserted into the uterus to determine the length of the uterine cavity and the direction the uterus may be tilted.  A slim instrument (IUD inserter) is inserted through the cervical   canal and into your uterus.  The IUD is placed in the uterine cavity and the insertion device is removed.  The nylon string that is attached to the IUD and used for eventual IUD removal is trimmed. It is trimmed so that it lays high in the vagina, just outside the cervix. AFTER THE PROCEDURE  You may have bleeding after the procedure. This is normal. It varies from light spotting for a few days to menstrual-like  bleeding.  You may have mild cramping.   This information is not intended to replace advice given to you by your health care provider. Make sure you discuss any questions you have with your health care provider.   Document Released: 03/29/2011 Document Revised: 05/21/2013 Document Reviewed: 01/19/2013 Elsevier Interactive Patient Education 2016 [employer]  
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
## 2015-09-09 NOTE — Addendum Note (Signed)
Addended by: Shelda Jakes E on: 09/09/2015 02:20 PM   Modules accepted: Orders

## 2015-09-10 ENCOUNTER — Telehealth: Payer: Self-pay

## 2015-09-10 LAB — WET PREP BY MOLECULAR PROBE
CANDIDA SPECIES: NEGATIVE
GARDNERELLA VAGINALIS: NEGATIVE
Trichomonas vaginosis: NEGATIVE

## 2015-09-10 LAB — URINALYSIS, MICROSCOPIC ONLY
Bacteria, UA: NONE SEEN [HPF]
CASTS: NONE SEEN [LPF]
Crystals: NONE SEEN [HPF]
WBC, UA: NONE SEEN WBC/HPF (ref <=5)
YEAST: NONE SEEN [HPF]

## 2015-09-10 LAB — STD PANEL
HIV: NONREACTIVE
Hepatitis B Surface Ag: NEGATIVE

## 2015-09-10 LAB — GC/CHLAMYDIA PROBE AMP
CT PROBE, AMP APTIMA: NOT DETECTED
GC PROBE AMP APTIMA: NOT DETECTED

## 2015-09-10 LAB — HEPATITIS C ANTIBODY: HCV Ab: NEGATIVE

## 2015-09-10 NOTE — Telephone Encounter (Signed)
Left message to call Kaitlyn at [phone]. 

## 2015-09-10 NOTE — Telephone Encounter (Signed)
-----   Message from Romualdo Bolk, MD sent at 09/10/2015  8:56 AM EST ----- Please inform of negative results. The steroid ointment should help her vulvar symptoms. The urine culture and GC/chlamydia probe are still pending.

## 2015-09-11 LAB — URINE CULTURE
COLONY COUNT: NO GROWTH
Organism ID, Bacteria: NO GROWTH

## 2015-09-13 NOTE — Telephone Encounter (Signed)
Spoke with patient. Advised of message as seen below from Dr.Jertson. Patient is agreeable and verbalizes understanding.  Notes Recorded by Lorri Frederick, CMA on 09/13/2015 at 10:00 AM Unable to leave a message- VM is full-eh Notes Recorded by Romualdo Bolk, MD on 09/13/2015 at 8:21 AM Please advise the patient of normal results  Routing to provider for final review. Patient agreeable to disposition. Will close encounter.

## 2015-09-15 ENCOUNTER — Telehealth: Payer: Self-pay | Admitting: Obstetrics and Gynecology

## 2015-09-15 DIAGNOSIS — Z30430 Encounter for insertion of intrauterine contraceptive device: Secondary | ICD-10-CM

## 2015-09-15 HISTORY — PX: INTRAUTERINE DEVICE INSERTION: SHX323

## 2015-09-15 NOTE — Telephone Encounter (Signed)
Patient called requesting to schedule an IUD this Friday, 09/17/15. She said, "That is the day I am supposed to start my period and that is the day I'd like to schedule for."

## 2015-09-15 NOTE — Telephone Encounter (Signed)
Spoke with patient. She reports she took her Nuvaring out today and expects her cycle to start Friday 09/17/2015 and would like to schedule her IUD insertion. Appointment scheduled for 09/20/2015 at 1:30 pm with Dr.Jertson. She is agreeable to date and time. Pre procedure instructions given.  Motrin instructions given. Motrin=Advil=Ibuprofen, 800 mg one hour before appointment. Eat a meal and hydrate well before appointment. Cytotec instructions given. Cytotec was previously sent in by Dr.Jertson. Order placed for IUD insertion precert.  Cc: Harland Dingwall  Routing to provider for final review. Patient agreeable to disposition. Will close encounter.

## 2015-09-20 ENCOUNTER — Ambulatory Visit (INDEPENDENT_AMBULATORY_CARE_PROVIDER_SITE_OTHER): Payer: BLUE CROSS/BLUE SHIELD | Admitting: Obstetrics and Gynecology

## 2015-09-20 ENCOUNTER — Encounter: Payer: Self-pay | Admitting: Obstetrics and Gynecology

## 2015-09-20 VITALS — BP 110/70 | HR 104 | Resp 14 | Wt 132.0 lb

## 2015-09-20 DIAGNOSIS — Z01812 Encounter for preprocedural laboratory examination: Secondary | ICD-10-CM

## 2015-09-20 LAB — POCT URINE PREGNANCY: Preg Test, Ur: NEGATIVE

## 2015-09-20 NOTE — Patient Instructions (Signed)
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
## 2015-09-27 ENCOUNTER — Telehealth: Payer: Self-pay | Admitting: Obstetrics and Gynecology

## 2015-09-27 NOTE — Telephone Encounter (Signed)
Pharmacy is calling to check on status of prior authorization for Valisone Cream.  Faxed request on 09/09/15.

## 2015-09-27 NOTE — Telephone Encounter (Signed)
Spoke with [facility] advised we received approval for Valisone Cream on 09/14/2015. See approval note below from Cover my meds. [facility] reprocessed the prescription and it went through.  BJYNWG:[id number];MVHQION Name:ST Non-Preferred High Potency Topical Corticosteroids 75 - Anthem National;Status:Approved;Coverage Start Date:09/14/2015;Coverage End Date:09/13/2016;  Routing to provider for final review. Patient agreeable to disposition. Will close encounter.

## 2015-10-21 ENCOUNTER — Ambulatory Visit: Payer: BLUE CROSS/BLUE SHIELD | Admitting: Obstetrics and Gynecology

## 2015-10-28 ENCOUNTER — Ambulatory Visit (INDEPENDENT_AMBULATORY_CARE_PROVIDER_SITE_OTHER): Payer: BLUE CROSS/BLUE SHIELD | Admitting: Obstetrics and Gynecology

## 2015-10-28 ENCOUNTER — Encounter: Payer: Self-pay | Admitting: Obstetrics and Gynecology

## 2015-10-28 VITALS — BP 112/70 | HR 92 | Resp 16 | Wt 132.0 lb

## 2015-10-28 DIAGNOSIS — N898 Other specified noninflammatory disorders of vagina: Secondary | ICD-10-CM

## 2015-10-28 DIAGNOSIS — F1010 Alcohol abuse, uncomplicated: Secondary | ICD-10-CM

## 2015-10-28 DIAGNOSIS — Z30431 Encounter for routine checking of intrauterine contraceptive device: Secondary | ICD-10-CM | POA: Diagnosis not present

## 2015-10-28 DIAGNOSIS — F1011 Alcohol abuse, in remission: Secondary | ICD-10-CM

## 2015-10-28 DIAGNOSIS — L293 Anogenital pruritus, unspecified: Secondary | ICD-10-CM | POA: Diagnosis not present

## 2015-10-28 LAB — CBC
HEMATOCRIT: 39 % (ref 36.0–46.0)
HEMOGLOBIN: 13 g/dL (ref 12.0–15.0)
MCH: 30 pg (ref 26.0–34.0)
MCHC: 33.3 g/dL (ref 30.0–36.0)
MCV: 90.1 fL (ref 78.0–100.0)
MPV: 10.3 fL (ref 8.6–12.4)
PLATELETS: 247 10*3/uL (ref 150–400)
RBC: 4.33 MIL/uL (ref 3.87–5.11)
RDW: 14.6 % (ref 11.5–15.5)
WBC: 6.9 10*3/uL (ref 4.0–10.5)

## 2015-10-28 LAB — LIPID PANEL
CHOL/HDL RATIO: 3.3 ratio (ref <=5.0)
CHOLESTEROL: 166 mg/dL (ref 125–200)
HDL: 51 mg/dL (ref >=46)
LDL Cholesterol: 84 mg/dL (ref <130)
TRIGLYCERIDES: 157 mg/dL — AB (ref <150)
VLDL: 31 mg/dL — AB (ref <30)

## 2015-10-28 LAB — COMPREHENSIVE METABOLIC PANEL
ALBUMIN: 4.4 g/dL (ref 3.6–5.1)
ALT: 51 U/L — ABNORMAL HIGH (ref 6–29)
AST: 35 U/L — ABNORMAL HIGH (ref 10–30)
Alkaline Phosphatase: 82 U/L (ref 33–115)
BUN: 6 mg/dL — ABNORMAL LOW (ref 7–25)
CALCIUM: 9.5 mg/dL (ref 8.6–10.2)
CO2: 27 mmol/L (ref 20–31)
Chloride: 103 mmol/L (ref 98–110)
Creat: 0.6 mg/dL (ref 0.50–1.10)
Glucose, Bld: 99 mg/dL (ref 65–99)
POTASSIUM: 4.7 mmol/L (ref 3.5–5.3)
Sodium: 139 mmol/L (ref 135–146)
TOTAL PROTEIN: 6.9 g/dL (ref 6.1–8.1)
Total Bilirubin: 0.8 mg/dL (ref 0.2–1.2)

## 2015-10-28 MED ORDER — BETAMETHASONE VALERATE 0.1 % EX OINT: TOPICAL_OINTMENT | CUTANEOUS | Status: DC

## 2015-10-28 NOTE — Progress Notes (Signed)
Patient is scheduled for a new patient appointment to [facility]:  [facility] [address]  [place], [place]   New patient appointment papers will come to your house. Arrive 15 minutes early.   Please call [phone] if you need to cancel or reschedule within 24 hours of the appointment.

## 2015-10-28 NOTE — Patient Instructions (Addendum)
Beth [name]: Primary Care NP Jeanine LuzGregory [name] [name]  [place], [place]   New patient appointment papers will come to your house. Arrive 15 minutes early.   Please call [phone] if you need to cancel or reschedule within 24 hours of the appointment.
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
## 2015-10-29 LAB — WET PREP BY MOLECULAR PROBE
Candida species: NEGATIVE
Gardnerella vaginalis: NEGATIVE
TRICHOMONAS VAG: NEGATIVE

## 2015-10-29 LAB — VITAMIN D 25 HYDROXY (VIT D DEFICIENCY, FRACTURES): Vit D, 25-Hydroxy: 8 ng/mL — ABNORMAL LOW (ref 30–100)

## 2015-11-04 ENCOUNTER — Telehealth: Payer: Self-pay | Admitting: Emergency Medicine

## 2015-11-04 NOTE — Telephone Encounter (Signed)
Message left to return call to Draven Natter at [phone].    

## 2015-11-04 NOTE — Telephone Encounter (Signed)
-----   Message from Romualdo BolkJill Evelyn Jertson, MD sent at 10/29/2015  8:49 AM EDT ----- Please let the patient know that her vit D level is very low and call in 50,000 IU a week of vit D, #12, no refills. She needs to come back in 3 months for another vit D level (please arrange). Her LFT's are also slightly elevated. She should f/u with her new primary MD. I would encourage her to try and avoid ETOH altogether. She has a h/o ETOH abuse and has cut back dramatically. See if she is interested in any of the addiction programs that are available.  Her Triglycerides were also slightly elevated (I don't recall if she was fasting, it could be elevated if she wasn't fasting). She should f/u with her primary as well. Please make sure her primary has a copy of all of this blood work.

## 2015-11-05 MED ORDER — VITAMIN D (ERGOCALCIFEROL) 1.25 MG (50000 UNIT) PO CAPS: 50000.0000 [IU] | ORAL_CAPSULE | ORAL | Status: DC

## 2015-11-05 NOTE — Telephone Encounter (Signed)
Spoke with patient and she is given results from Dr. Oscar LaJertson.  Verbalized understanding of results and necessary follow up. [facility] and results are available in Epic.   She will take Vitamin D as directed and call back for lab appointment when she has completed 12 weeks of treatment.     Routing to provider for final review. Patient agreeable to disposition. Will close encounter.

## 2015-12-02 ENCOUNTER — Encounter: Payer: Self-pay | Admitting: Family

## 2015-12-02 ENCOUNTER — Other Ambulatory Visit (INDEPENDENT_AMBULATORY_CARE_PROVIDER_SITE_OTHER): Payer: BLUE CROSS/BLUE SHIELD

## 2015-12-02 ENCOUNTER — Ambulatory Visit (INDEPENDENT_AMBULATORY_CARE_PROVIDER_SITE_OTHER): Payer: BLUE CROSS/BLUE SHIELD | Admitting: Family

## 2015-12-02 ENCOUNTER — Telehealth: Payer: Self-pay | Admitting: Family

## 2015-12-02 VITALS — BP 110/78 | HR 103 | Temp 98.2°F | Resp 16 | Ht 61.75 in | Wt 137.0 lb

## 2015-12-02 DIAGNOSIS — Z23 Encounter for immunization: Secondary | ICD-10-CM | POA: Diagnosis not present

## 2015-12-02 DIAGNOSIS — Z0000 Encounter for general adult medical examination without abnormal findings: Secondary | ICD-10-CM

## 2015-12-02 DIAGNOSIS — R945 Abnormal results of liver function studies: Principal | ICD-10-CM

## 2015-12-02 DIAGNOSIS — R7989 Other specified abnormal findings of blood chemistry: Secondary | ICD-10-CM

## 2015-12-02 LAB — HEPATIC FUNCTION PANEL
ALBUMIN: 4.3 g/dL (ref 3.5–5.2)
ALT: 45 U/L — AB (ref 0–35)
AST: 25 U/L (ref 0–37)
Alkaline Phosphatase: 90 U/L (ref 39–117)
Bilirubin, Direct: 0.2 mg/dL (ref 0.0–0.3)
Total Bilirubin: 0.8 mg/dL (ref 0.2–1.2)
Total Protein: 6.8 g/dL (ref 6.0–8.3)

## 2015-12-02 NOTE — Progress Notes (Signed)
Pre visit review using our clinic review tool, if applicable. No additional management support is needed unless otherwise documented below in the visit note. 
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
## 2015-12-02 NOTE — Assessment & Plan Note (Addendum)
Noted to have elevated liver function tests during most recent office visit to gynecology. Obtain hepatic panel. If LFTs remain high consider ultrasound to rule out fatty liver. Elevated LFTs are most likely related to previous alcohol abuse. Advised to limit alcohol and Tylenol usage as much as possible.

## 2015-12-02 NOTE — Patient Instructions (Addendum)
Thank you for choosing [facility].  Summary/Instructions:   Please stop by the lab on the basement level of the building for your blood work. Your results will be released to MyChart (or called to you) after review, usually within 72 hours after test completion. If any changes need to be made, you will be notified at that same time.  Health Maintenance, Female Adopting a healthy lifestyle and getting preventive care can go a long way to promote health and wellness. Talk with your health care provider about what schedule of regular examinations is right for you. This is a good chance for you to check in with your provider about disease prevention and staying healthy. In between checkups, there are plenty of things you can do on your own. Experts have done a lot of research about which lifestyle changes and preventive measures are most likely to keep you healthy. Ask your health care provider for more information. WEIGHT AND DIET  Eat a healthy diet  Be sure to include plenty of vegetables, fruits, low-fat dairy products, and lean protein.  Do not eat a lot of foods high in solid fats, added sugars, or salt.  Get regular exercise. This is one of the most important things you can do for your health.  Most adults should exercise for at least 150 minutes each week. The exercise should increase your heart rate and make you sweat (moderate-intensity exercise).  Most adults should also do strengthening exercises at least twice a week. This is in addition to the moderate-intensity exercise.  Maintain a healthy weight  Body mass index (BMI) is a measurement that can be used to identify possible weight problems. It estimates body fat based on height and weight. Your health care provider can help determine your BMI and help you achieve or maintain a healthy weight.  For females 20 years of age and older:   A BMI below 18.5 is considered underweight.  A BMI of 18.5 to 24.9 is normal.  A  BMI of 25 to 29.9 is considered overweight.  A BMI of 30 and above is considered obese.  Watch levels of cholesterol and blood lipids  You should start having your blood tested for lipids and cholesterol at 27 years of age, then have this test every 5 years.  You may need to have your cholesterol levels checked more often if:  Your lipid or cholesterol levels are high.  You are older than 27 years of age.  You are at high risk for heart disease.  CANCER SCREENING   Lung Cancer  Lung cancer screening is recommended for adults 55-80 years old who are at high risk for lung cancer because of a history of smoking.  A yearly low-dose CT scan of the lungs is recommended for people who:  Currently smoke.  Have quit within the past 15 years.  Have at least a 30-pack-year history of smoking. A pack year is smoking an average of one pack of cigarettes a day for 1 year.  Yearly screening should continue until it has been 15 years since you quit.  Yearly screening should stop if you develop a health problem that would prevent you from having lung cancer treatment.  Breast Cancer  Practice breast self-awareness. This means understanding how your breasts normally appear and feel.  It also means doing regular breast self-exams. Let your health care provider know about any changes, no matter how small.  If you are in your 20s or 30s, you should have a   clinical breast exam (CBE) by a health care provider every 1-3 years as part of a regular health exam.  If you are 40 or older, have a CBE every year. Also consider having a breast X-ray (mammogram) every year.  If you have a family history of breast cancer, talk to your health care provider about genetic screening.  If you are at high risk for breast cancer, talk to your health care provider about having an MRI and a mammogram every year.  Breast cancer gene (BRCA) assessment is recommended for women who have family members with  BRCA-related cancers. BRCA-related cancers include:  Breast.  Ovarian.  Tubal.  Peritoneal cancers.  Results of the assessment will determine the need for genetic counseling and BRCA1 and BRCA2 testing. Cervical Cancer Your health care provider may recommend that you be screened regularly for cancer of the pelvic organs (ovaries, uterus, and vagina). This screening involves a pelvic examination, including checking for microscopic changes to the surface of your cervix (Pap test). You may be encouraged to have this screening done every 3 years, beginning at age 21.  For women ages 30-65, health care providers may recommend pelvic exams and Pap testing every 3 years, or they may recommend the Pap and pelvic exam, combined with testing for human papilloma virus (HPV), every 5 years. Some types of HPV increase your risk of cervical cancer. Testing for HPV may also be done on women of any age with unclear Pap test results.  Other health care providers may not recommend any screening for nonpregnant women who are considered low risk for pelvic cancer and who do not have symptoms. Ask your health care provider if a screening pelvic exam is right for you.  If you have had past treatment for cervical cancer or a condition that could lead to cancer, you need Pap tests and screening for cancer for at least 20 years after your treatment. If Pap tests have been discontinued, your risk factors (such as having a new sexual partner) need to be reassessed to determine if screening should resume. Some women have medical problems that increase the chance of getting cervical cancer. In these cases, your health care provider may recommend more frequent screening and Pap tests. Colorectal Cancer  This type of cancer can be detected and often prevented.  Routine colorectal cancer screening usually begins at 27 years of age and continues through 27 years of age.  Your health care provider may recommend screening at  an earlier age if you have risk factors for colon cancer.  Your health care provider may also recommend using home test kits to check for hidden blood in the stool.  A small camera at the end of a tube can be used to examine your colon directly (sigmoidoscopy or colonoscopy). This is done to check for the earliest forms of colorectal cancer.  Routine screening usually begins at age 50.  Direct examination of the colon should be repeated every 5-10 years through 27 years of age. However, you may need to be screened more often if early forms of precancerous polyps or small growths are found. Skin Cancer  Check your skin from head to toe regularly.  Tell your health care provider about any new moles or changes in moles, especially if there is a change in a mole's shape or color.  Also tell your health care provider if you have a mole that is larger than the size of a pencil eraser.  Always use sunscreen. Apply sunscreen liberally   and repeatedly throughout the day.  Protect yourself by wearing long sleeves, pants, a wide-brimmed hat, and sunglasses whenever you are outside. HEART DISEASE, DIABETES, AND HIGH BLOOD PRESSURE   High blood pressure causes heart disease and increases the risk of stroke. High blood pressure is more likely to develop in:  People who have blood pressure in the high end of the normal range (130-139/85-89 mm Hg).  People who are overweight or obese.  People who are African American.  If you are 18-39 years of age, have your blood pressure checked every 3-5 years. If you are 40 years of age or older, have your blood pressure checked every year. You should have your blood pressure measured twice--once when you are at a hospital or clinic, and once when you are not at a hospital or clinic. Record the average of the two measurements. To check your blood pressure when you are not at a hospital or clinic, you can use:  An automated blood pressure machine at a  pharmacy.  A home blood pressure monitor.  If you are between 55 years and 79 years old, ask your health care provider if you should take aspirin to prevent strokes.  Have regular diabetes screenings. This involves taking a blood sample to check your fasting blood sugar level.  If you are at a normal weight and have a low risk for diabetes, have this test once every three years after 27 years of age.  If you are overweight and have a high risk for diabetes, consider being tested at a younger age or more often. PREVENTING INFECTION  Hepatitis B  If you have a higher risk for hepatitis B, you should be screened for this virus. You are considered at high risk for hepatitis B if:  You were born in a country where hepatitis B is common. Ask your health care provider which countries are considered high risk.  Your parents were born in a high-risk country, and you have not been immunized against hepatitis B (hepatitis B vaccine).  You have HIV or AIDS.  You use needles to inject street drugs.  You live with someone who has hepatitis B.  You have had sex with someone who has hepatitis B.  You get hemodialysis treatment.  You take certain medicines for conditions, including cancer, organ transplantation, and autoimmune conditions. Hepatitis C  Blood testing is recommended for:  Everyone born from 1945 through 1965.  Anyone with known risk factors for hepatitis C. Sexually transmitted infections (STIs)  You should be screened for sexually transmitted infections (STIs) including gonorrhea and chlamydia if:  You are sexually active and are younger than 27 years of age.  You are older than 27 years of age and your health care provider tells you that you are at risk for this type of infection.  Your sexual activity has changed since you were last screened and you are at an increased risk for chlamydia or gonorrhea. Ask your health care provider if you are at risk.  If you do not  have HIV, but are at risk, it may be recommended that you take a prescription medicine daily to prevent HIV infection. This is called pre-exposure prophylaxis (PrEP). You are considered at risk if:  You are sexually active and do not regularly use condoms or know the HIV status of your partner(s).  You take drugs by injection.  You are sexually active with a partner who has HIV. Talk with your health care provider about whether you are   at high risk of being infected with HIV. If you choose to begin PrEP, you should first be tested for HIV. You should then be tested every 3 months for as long as you are taking PrEP.  PREGNANCY   If you are premenopausal and you may become pregnant, ask your health care provider about preconception counseling.  If you may become pregnant, take 400 to 800 micrograms (mcg) of folic acid every day.  If you want to prevent pregnancy, talk to your health care provider about birth control (contraception). OSTEOPOROSIS AND MENOPAUSE   Osteoporosis is a disease in which the bones lose minerals and strength with aging. This can result in serious bone fractures. Your risk for osteoporosis can be identified using a bone density scan.  If you are 65 years of age or older, or if you are at risk for osteoporosis and fractures, ask your health care provider if you should be screened.  Ask your health care provider whether you should take a calcium or vitamin D supplement to lower your risk for osteoporosis.  Menopause may have certain physical symptoms and risks.  Hormone replacement therapy may reduce some of these symptoms and risks. Talk to your health care provider about whether hormone replacement therapy is right for you.  HOME CARE INSTRUCTIONS   Schedule regular health, dental, and eye exams.  Stay current with your immunizations.   Do not use any tobacco products including cigarettes, chewing tobacco, or electronic cigarettes.  If you are pregnant, do not  drink alcohol.  If you are breastfeeding, limit how much and how often you drink alcohol.  Limit alcohol intake to no more than 1 drink per day for nonpregnant women. One drink equals 12 ounces of beer, 5 ounces of wine, or 1 ounces of hard liquor.  Do not use street drugs.  Do not share needles.  Ask your health care provider for help if you need support or information about quitting drugs.  Tell your health care provider if you often feel depressed.  Tell your health care provider if you have ever been abused or do not feel safe at home.   This information is not intended to replace advice given to you by your health care provider. Make sure you discuss any questions you have with your health care provider.   Document Released: 02/13/2011 Document Revised: 08/21/2014 Document Reviewed: 07/02/2013 Elsevier Interactive Patient Education 2016 [employer]    

## 2015-12-02 NOTE — Telephone Encounter (Signed)
Please inform patient that her liver function is slightly improved since the previous blood work that was performed. I do not see any reason that an ultrasound would be medically necessary at this time. We will continue to monitor for future elevation of her liver function tests. I did advise continued abstinence from alcohol and minimal use of Tylenol. Please let me know if she has any questions.

## 2015-12-02 NOTE — Assessment & Plan Note (Signed)
1) Anticipatory Guidance: Discussed importance of wearing a seatbelt while driving and not texting while driving; changing batteries in smoke detector at least once annually; wearing suntan lotion when outside; eating a balanced and moderate diet; getting physical activity at least 30 minutes per day.  2) Immunizations / Screenings / Labs: Tetanus updated today. All other immunizations are up-to-date per recommendations. Due for a vision and dental screening. Referral for ophthalmology place for a vision screen per patient request. Dental screening to be completed independently. Noted to have elevated liver enzymes during most recent gynecological exam. Obtain hepatic panel to recheck. All other screenings are up-to-date per recommendations.  Overall well exam with risk factors for cardiovascular disease including previous alcohol abuse and tobacco use. Currently alcohol use is less than 1-2 drinks per week. She is working on tobacco cessation independently. Continue other healthy lifestyle behaviors and choices. Follow-up prevention exam in 1 year. Follow-up office visit pending blood work and for tobacco cessation as needed.

## 2015-12-06 NOTE — Telephone Encounter (Signed)
LVM letting pt know results. 

## 2016-01-12 ENCOUNTER — Encounter: Payer: Self-pay | Admitting: Obstetrics and Gynecology

## 2016-04-29 ENCOUNTER — Emergency Department (HOSPITAL_COMMUNITY)
Admission: EM | Admit: 2016-04-29 | Discharge: 2016-04-29 | Disposition: A | Discharge status: Home / Self Care | Payer: BLUE CROSS/BLUE SHIELD | Attending: Emergency Medicine | Admitting: Emergency Medicine

## 2016-04-29 ENCOUNTER — Encounter (HOSPITAL_COMMUNITY): Payer: Self-pay | Admitting: Emergency Medicine

## 2016-04-29 ENCOUNTER — Emergency Department (HOSPITAL_COMMUNITY): Payer: BLUE CROSS/BLUE SHIELD

## 2016-04-29 DIAGNOSIS — Y999 Unspecified external cause status: Secondary | ICD-10-CM | POA: Insufficient documentation

## 2016-04-29 DIAGNOSIS — Z79899 Other long term (current) drug therapy: Secondary | ICD-10-CM | POA: Diagnosis not present

## 2016-04-29 DIAGNOSIS — Y92410 Unspecified street and highway as the place of occurrence of the external cause: Secondary | ICD-10-CM | POA: Diagnosis not present

## 2016-04-29 DIAGNOSIS — Y9389 Activity, other specified: Secondary | ICD-10-CM | POA: Insufficient documentation

## 2016-04-29 DIAGNOSIS — F17210 Nicotine dependence, cigarettes, uncomplicated: Secondary | ICD-10-CM | POA: Diagnosis not present

## 2016-04-29 DIAGNOSIS — R079 Chest pain, unspecified: Secondary | ICD-10-CM | POA: Diagnosis present

## 2016-04-29 LAB — BASIC METABOLIC PANEL
ANION GAP: 7 (ref 5–15)
BUN: 12 mg/dL (ref 6–20)
CALCIUM: 9.7 mg/dL (ref 8.9–10.3)
CO2: 23 mmol/L (ref 22–32)
Chloride: 107 mmol/L (ref 101–111)
Creatinine, Ser: 0.62 mg/dL (ref 0.44–1.00)
Glucose, Bld: 97 mg/dL (ref 65–99)
POTASSIUM: 3.9 mmol/L (ref 3.5–5.1)
Sodium: 137 mmol/L (ref 135–145)

## 2016-04-29 LAB — CBC
HEMATOCRIT: 40.9 % (ref 36.0–46.0)
HEMOGLOBIN: 13.6 g/dL (ref 12.0–15.0)
MCH: 30.8 pg (ref 26.0–34.0)
MCHC: 33.3 g/dL (ref 30.0–36.0)
MCV: 92.5 fL (ref 78.0–100.0)
Platelets: 296 10*3/uL (ref 150–400)
RBC: 4.42 MIL/uL (ref 3.87–5.11)
RDW: 13.4 % (ref 11.5–15.5)
WBC: 9.2 10*3/uL (ref 4.0–10.5)

## 2016-04-29 LAB — I-STAT TROPONIN, ED: TROPONIN I, POC: 0 ng/mL (ref 0.00–0.08)

## 2016-04-29 MED ORDER — OXYCODONE HCL 5 MG PO TABS
5.0000 mg | ORAL_TABLET | Freq: Once | ORAL | Status: AC
  Administered 2016-04-29: 5 mg via ORAL
  Filled 2016-04-29: qty 1

## 2016-04-29 MED ORDER — ACETAMINOPHEN 500 MG PO TABS
1000.0000 mg | ORAL_TABLET | Freq: Once | ORAL | Status: AC
Start: 2016-04-29 — End: 2016-04-29
  Administered 2016-04-29: 1000 mg via ORAL
  Filled 2016-04-29: qty 2

## 2016-04-29 MED ORDER — IBUPROFEN 800 MG PO TABS
800.0000 mg | ORAL_TABLET | Freq: Once | ORAL | Status: AC
  Administered 2016-04-29: 800 mg via ORAL
  Filled 2016-04-29: qty 1

## 2016-04-29 NOTE — Discharge Instructions (Signed)
Take 4 over the counter ibuprofen tablets 3 times a day or 2 over-the-counter naproxen tablets twice a day for pain. Also take tylenol 1000mg(2 extra strength) four times a day.    
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
## 2016-05-18 ENCOUNTER — Encounter: Payer: Self-pay | Admitting: Obstetrics and Gynecology

## 2016-06-05 ENCOUNTER — Inpatient Hospital Stay (HOSPITAL_COMMUNITY)
Admission: RE | Admit: 2016-06-05 | Discharge: 2016-06-08 | DRG: 885 | Disposition: A | Discharge status: Home / Self Care | Payer: BLUE CROSS/BLUE SHIELD | Attending: Psychiatry | Admitting: Psychiatry

## 2016-06-05 ENCOUNTER — Encounter (HOSPITAL_COMMUNITY): Payer: Self-pay | Admitting: *Deleted

## 2016-06-05 DIAGNOSIS — F333 Major depressive disorder, recurrent, severe with psychotic symptoms: Principal | ICD-10-CM | POA: Diagnosis present

## 2016-06-05 DIAGNOSIS — Z8249 Family history of ischemic heart disease and other diseases of the circulatory system: Secondary | ICD-10-CM | POA: Diagnosis not present

## 2016-06-05 DIAGNOSIS — Z825 Family history of asthma and other chronic lower respiratory diseases: Secondary | ICD-10-CM | POA: Diagnosis not present

## 2016-06-05 DIAGNOSIS — R45851 Suicidal ideations: Secondary | ICD-10-CM | POA: Diagnosis present

## 2016-06-05 DIAGNOSIS — Z808 Family history of malignant neoplasm of other organs or systems: Secondary | ICD-10-CM | POA: Diagnosis not present

## 2016-06-05 DIAGNOSIS — G4700 Insomnia, unspecified: Secondary | ICD-10-CM | POA: Diagnosis present

## 2016-06-05 DIAGNOSIS — R4584 Anhedonia: Secondary | ICD-10-CM | POA: Diagnosis present

## 2016-06-05 DIAGNOSIS — Z23 Encounter for immunization: Secondary | ICD-10-CM | POA: Diagnosis not present

## 2016-06-05 DIAGNOSIS — Z833 Family history of diabetes mellitus: Secondary | ICD-10-CM | POA: Diagnosis not present

## 2016-06-05 LAB — COMPREHENSIVE METABOLIC PANEL
ALT: 19 U/L (ref 14–54)
ANION GAP: 7 (ref 5–15)
AST: 26 U/L (ref 15–41)
Albumin: 4.4 g/dL (ref 3.5–5.0)
Alkaline Phosphatase: 71 U/L (ref 38–126)
BUN: 9 mg/dL (ref 6–20)
CHLORIDE: 104 mmol/L (ref 101–111)
CO2: 25 mmol/L (ref 22–32)
Calcium: 9.4 mg/dL (ref 8.9–10.3)
Creatinine, Ser: 0.55 mg/dL (ref 0.44–1.00)
GFR calc non Af Amer: >60 mL/min (ref >60)
Glucose, Bld: 118 mg/dL — ABNORMAL HIGH (ref 65–99)
Potassium: 3.9 mmol/L (ref 3.5–5.1)
SODIUM: 136 mmol/L (ref 135–145)
Total Bilirubin: 0.8 mg/dL (ref 0.3–1.2)
Total Protein: 7 g/dL (ref 6.5–8.1)

## 2016-06-05 LAB — CBC
HCT: 37.9 % (ref 36.0–46.0)
Hemoglobin: 12.7 g/dL (ref 12.0–15.0)
MCH: 31 pg (ref 26.0–34.0)
MCHC: 33.5 g/dL (ref 30.0–36.0)
MCV: 92.4 fL (ref 78.0–100.0)
PLATELETS: 296 10*3/uL (ref 150–400)
RBC: 4.1 MIL/uL (ref 3.87–5.11)
RDW: 13.3 % (ref 11.5–15.5)
WBC: 8.7 10*3/uL (ref 4.0–10.5)

## 2016-06-05 LAB — ETHANOL

## 2016-06-05 LAB — RAPID URINE DRUG SCREEN, HOSP PERFORMED
Amphetamines: NOT DETECTED
BENZODIAZEPINES: NOT DETECTED
Barbiturates: NOT DETECTED
COCAINE: NOT DETECTED
Opiates: NOT DETECTED
Tetrahydrocannabinol: POSITIVE — AB

## 2016-06-05 LAB — TSH: TSH: 0.472 u[IU]/mL (ref 0.350–4.500)

## 2016-06-05 LAB — URINALYSIS, ROUTINE W REFLEX MICROSCOPIC
Bilirubin Urine: NEGATIVE
Glucose, UA: NEGATIVE mg/dL
KETONES UR: NEGATIVE mg/dL
LEUKOCYTES UA: NEGATIVE
Nitrite: NEGATIVE
PROTEIN: NEGATIVE mg/dL
Specific Gravity, Urine: 1.02 (ref 1.005–1.030)
pH: 5.5 (ref 5.0–8.0)

## 2016-06-05 LAB — URINE MICROSCOPIC-ADD ON: SQUAMOUS EPITHELIAL / LPF: NONE SEEN

## 2016-06-05 LAB — PREGNANCY, URINE: PREG TEST UR: NEGATIVE

## 2016-06-05 MED ORDER — MAGNESIUM HYDROXIDE 400 MG/5ML PO SUSP
30.0000 mL | Freq: Every day | ORAL | Status: DC | PRN
  Filled 2016-06-05: qty 30

## 2016-06-05 MED ORDER — PNEUMOCOCCAL VAC POLYVALENT 25 MCG/0.5ML IJ INJ
0.5000 mL | INJECTION | INTRAMUSCULAR | Status: AC
  Administered 2016-06-06: 0.5 mL via INTRAMUSCULAR

## 2016-06-05 MED ORDER — ALUM & MAG HYDROXIDE-SIMETH 200-200-20 MG/5ML PO SUSP
30.0000 mL | ORAL | Status: DC | PRN
  Filled 2016-06-05: qty 30

## 2016-06-05 MED ORDER — MIRTAZAPINE 15 MG PO TABS
15.0000 mg | ORAL_TABLET | Freq: Every evening | ORAL | Status: DC | PRN
  Administered 2016-06-05: 15 mg via ORAL
  Filled 2016-06-05 (×4): qty 1

## 2016-06-05 MED ORDER — HYDROXYZINE HCL 25 MG PO TABS
25.0000 mg | ORAL_TABLET | Freq: Four times a day (QID) | ORAL | Status: DC | PRN
  Administered 2016-06-05 – 2016-06-08 (×5): 25 mg via ORAL
  Filled 2016-06-05 (×6): qty 1

## 2016-06-05 MED ORDER — TRAZODONE HCL 50 MG PO TABS
50.0000 mg | ORAL_TABLET | Freq: Every evening | ORAL | Status: DC | PRN
  Administered 2016-06-05 – 2016-06-06 (×2): 50 mg via ORAL
  Filled 2016-06-05 (×3): qty 1

## 2016-06-05 MED ORDER — ACETAMINOPHEN 325 MG PO TABS
650.0000 mg | ORAL_TABLET | Freq: Four times a day (QID) | ORAL | Status: DC | PRN
  Filled 2016-06-05: qty 2

## 2016-06-05 MED ORDER — NICOTINE 21 MG/24HR TD PT24
21.0000 mg | MEDICATED_PATCH | Freq: Every day | TRANSDERMAL | Status: DC
  Administered 2016-06-06 – 2016-06-08 (×3): 21 mg via TRANSDERMAL
  Filled 2016-06-05 (×7): qty 1

## 2016-06-05 MED ORDER — INFLUENZA VAC SPLIT QUAD 0.5 ML IM SUSY
0.5000 mL | PREFILLED_SYRINGE | INTRAMUSCULAR | Status: AC
  Administered 2016-06-06: 0.5 mL via INTRAMUSCULAR
  Filled 2016-06-05: qty 0.5
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
## 2016-06-05 NOTE — Progress Notes (Signed)
Adult Psychoeducational Group Note  Date:  06/05/2016 Time:  8:59 PM  Group Topic/Focus:  Wrap-Up Group:   The focus of this group is to help patients review their daily goal of treatment and discuss progress on daily workbooks.   Participation Level:  Active  Participation Quality:  Appropriate  Affect:  Appropriate  Cognitive:  Alert  Insight: Appropriate  Engagement in Group:  Engaged  Modes of Intervention:  Discussion  Additional Comments:  Patient states that she wants to work on not killing herself while she's here.  Beth [name] [name] 06/05/2016, 8:59 PM
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
## 2016-06-05 NOTE — H&P (Signed)
Behavioral Health Medical Screening Exam  Beth RuaRachel E Schriever is an 25 y.o. female who presented as a walk in with mother due to worsening depression with suicidal thoughts. Patient has plan to shoot herself and is unable to contract for her safety. Will be accepted for inpatient admission.   Total Time spent with patient: 20 minutes  Psychiatric Specialty Exam: Physical Exam  Constitutional: She is oriented to person, place, and time. She appears well-developed and well-nourished.  HENT:  Head: Normocephalic and atraumatic.  Right Ear: External ear normal.  Left Ear: External ear normal.  Neck: Normal range of motion.  Cardiovascular: Normal rate, regular rhythm, normal heart sounds and intact distal pulses.   Respiratory: Effort normal and breath sounds normal.  GI: Soft. Bowel sounds are normal.  Musculoskeletal: Normal range of motion.  Neurological: She is alert and oriented to person, place, and time.  Skin: Skin is warm and dry.    Review of Systems  Psychiatric/Behavioral: Positive for depression, substance abuse and suicidal ideas. Negative for hallucinations and memory loss. The patient is nervous/anxious. The patient does not have insomnia.     Blood pressure 108/68, pulse 82, temperature 97.8 F (36.6 C), resp. rate 16.There is no height or weight on file to calculate BMI.  General Appearance: Casual  Eye Contact:  Good  Speech:  Clear and Coherent  Volume:  Normal  Mood:  Depressed  Affect:  Congruent  Thought Process:  Coherent and Goal Directed  Orientation:  Full (Time, Place, and Person)  Thought Content:  Symptoms, worries, concerns  Suicidal Thoughts:  Yes.  with intent/plan  Homicidal Thoughts:  No  Memory:  Immediate;   Good Recent;   Good Remote;   Good  Judgement:  Fair  Insight:  Present  Psychomotor Activity:  Normal  Concentration: Concentration: Good and Attention Span: Good  Recall:  Good  Fund of Knowledge:Good  Language: Good  Akathisia:  No   Handed:  Right  AIMS (if indicated):     Assets:  Communication Skills Desire for Improvement Financial Resources/Insurance Housing Intimacy Leisure Time Physical Health Resilience Social Support  Sleep:       Musculoskeletal: Strength & Muscle Tone: within normal limits Gait & Station: normal Patient leans: N/A  Blood pressure 108/68, pulse 82, temperature 97.8 F (36.6 C), resp. rate 16.  Recommendations:  Based on my evaluation the patient does not appear to have an emergency medical condition.  Fransisca KaufmannAVIS, Legrand Lasser, NP 06/05/2016, 4:34 PM

## 2016-06-05 NOTE — Tx Team (Signed)
Initial Treatment Plan 06/05/2016 5:37 PM Beth [name] [id number]    PATIENT STRESSORS: Legal issue Marital or family conflict Medication change or noncompliance Substance abuse   PATIENT STRENGTHS: Average or above average intelligence Capable of independent living Communication skills Physical Health Supportive family/friends   PATIENT IDENTIFIED PROBLEMS: Depression  Alcohol abuse  Prior stimulant abuse  "I've been suicidal all my life."               DISCHARGE CRITERIA:  Improved stabilization in mood, thinking, and/or behavior Need for constant or close observation no longer present Withdrawal symptoms are absent or subacute and managed without 24-hour nursing intervention  PRELIMINARY DISCHARGE PLAN: Attend 12-step recovery group Outpatient therapy Return to previous living arrangement Return to previous work or school arrangements  PATIENT/FAMILY INVOLVEMENT: This treatment plan has been presented to and reviewed with the patient, Beth [name].  The patient and family have been given the opportunity to ask questions and make suggestions.  Cranford MonBeaudry, Ankita Newcomer Evans, RN 06/05/2016, 5:37 PM

## 2016-06-06 MED ORDER — DULOXETINE HCL 20 MG PO CPEP
20.0000 mg | ORAL_CAPSULE | Freq: Every day | ORAL | Status: DC
  Administered 2016-06-06 – 2016-06-08 (×3): 20 mg via ORAL
  Filled 2016-06-06: qty 1
  Filled 2016-06-06: qty 3
  Filled 2016-06-06 (×5): qty 1
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
## 2016-06-06 NOTE — Progress Notes (Signed)
DAR NOTE: Patient presents with anxious affect and depressed mood.  Denies pain, auditory and visual hallucinations.  Endorsing suicidal thoughts but contracts for safety.  Rates depression at 6, hopelessness at 6, and anxiety at 6.  Maintained on routine safety checks.  Medications given as prescribed.  Support and encouragement offered as needed.  Attended group and participated.  States goal for today is "try to get everything I can out of this treatment."  Patient observed socializing with peers in the dayroom.  Vistaril 25 mg given for complain of anxiety with good effect.

## 2016-06-06 NOTE — [facility] Suicide Risk Assessment (Signed)
[facility]   Nursing information obtained from:    Demographic factors:    Current Mental Status:    Loss Factors:    Historical Factors:    Risk Reduction Factors:     Total Time spent with patient: 1 hour Principal Problem: <principal problem not specified> Diagnosis:   Patient Active Problem List   Diagnosis Date Noted  . MDD (major depressive disorder), recurrent, severe, with psychosis ([facility]) [F33.3] 06/05/2016  . Routine general medical examination at a health care facility [Z00.00] 12/02/2015  . Elevated LFTs [R79.89] 12/02/2015   Subjective Data: see admission assessment  Continued Clinical Symptoms:  Alcohol Use Disorder Identification Test Final Score (AUDIT): 25 The "Alcohol Use Disorders Identification Test", Guidelines for Use in Primary Care, Second Edition.  World Science writerHealth Organization [facility](WHO). Score between 0-7:  no or low risk or alcohol related problems. Score between 8-15:  moderate risk of alcohol related problems. Score between 16-19:  high risk of alcohol related problems. Score 20 or above:  warrants further diagnostic evaluation for alcohol dependence and treatment.   CLINICAL FACTORS:   Depression:   Hopelessness Impulsivity Insomnia   Musculoskeletal:see admission assessment  Psychiatric Specialty Exam:see admission assessment Physical Exam  ROS  Blood pressure 97/75, pulse 100, temperature 98.7 F (37.1 C), temperature source Oral, resp. rate 18, height 5' (1.524 m), weight 63.5 kg (140 lb), SpO2 100 %.Body mass index is 27.34 kg/m.      COGNITIVE FEATURES THAT CONTRIBUTE TO RISK:  None    SUICIDE RISK:   Moderate:  Frequent suicidal ideation with limited intensity, and duration, some specificity in terms of plans, no associated intent, good self-control, limited dysphoria/symptomatology, some risk factors present, and identifiable protective factors, including available and accessible social support.   PLAN OF  CARE: see admission assessment  I certify that inpatient services furnished can reasonably be expected to improve the patient's condition.  Wynelle BourgeoisUreh N Lekauwa, MD 06/06/2016, 9:49 AM

## 2016-06-06 NOTE — [facility] Group Notes (Signed)
[facility] LCSW Group Therapy  06/06/2016   1:15 PM   Type of Therapy:  Group Therapy  Participation Level:  Active  Participation Quality:  Attentive, Sharing and Supportive  Affect:  Appropriate  Cognitive:  Alert and Oriented  Insight:  Developing/Improving and Engaged  Engagement in Therapy:  Developing/Improving and Engaged  Modes of Intervention:  Clarification, Confrontation, Discussion, Education, Exploration, Limit-setting, Orientation, Problem-solving, Rapport Building, Reality Testing, Socialization and Support  Summary of Progress/Problems: The topic for group therapy was feelings about diagnosis.  Pt actively participated in group discussion on their past and current diagnosis and how they feel towards this.  Pt also identified how society and family members judge them, based on their diagnosis as well as stereotypes and stigmas.    Beth [name], MSW, LCSW Clinical Social Worker  [facility] [phone]   

## 2016-06-06 NOTE — Progress Notes (Signed)
Adult Psychoeducational Group Note  Date:  06/06/2016 Time:  6:13 PM  Group Topic/Focus:  Recovery Goals:   The focus of this group is to identify appropriate goals for recovery and establish a plan to achieve them.   Participation Level:  Active  Participation Quality:  Appropriate and Attentive  Affect:  Appropriate  Cognitive:  Alert and Appropriate  Insight: Appropriate and Good  Engagement in Group:  Engaged  Modes of Intervention:  Discussion and Education   Mickie Baillizabeth O Iwenekha 06/06/2016, 6:13 PM

## 2016-06-06 NOTE — Progress Notes (Signed)
Adult Psychoeducational Group Note  Date:  06/06/2016 Time:  9:23 PM  Group Topic/Focus:  Wrap-Up Group:   The focus of this group is to help patients review their daily goal of treatment and discuss progress on daily workbooks.   Participation Level:  Active  Participation Quality:  Appropriate  Affect:  Appropriate  Cognitive:  Alert  Insight: Appropriate  Engagement in Group:  Engaged  Modes of Intervention:  Discussion  Additional Comments:  Patient states, "I had an okay day". Patient's goal for today was to get through the day.  Sereen Schaff L Carrolyn Hilmes 06/06/2016, 9:23 PM

## 2016-06-06 NOTE — Progress Notes (Signed)
Recreation Therapy Notes   Animal-Assisted Activity (AAA) Program Checklist/Progress Notes Patient Eligibility Criteria Checklist & Daily Group note for Rec TxIntervention  Date: 10.24.2017 Time: 2:45pm Location: 4 00 Hall Dayroom   AAA/T Program Assumption of Risk Form signed by Patient/ or Parent Legal Guardian Yes  Patient is free of allergies or sever asthma Yes  Patient reports no fear of animals Yes  Patient reports no history of cruelty to animals Yes  Patient understands his/her participation is voluntary Yes  Patient washes hands before animal contact Yes  Patient washes hands after animal contact Yes  Behavioral Response: Engaged, Attentive  Education:Hand Washing, Appropriate Animal Interaction   Education Outcome: Acknowledges education.   Clinical Observations/Feedback: Patient attended session and interacted appropriately with therapy dog and peers. Patient asked appropriate questions about therapy dog and his training. Patient shared stories about their pets at home with group.   Beth [name] L Beth [name], LRT/CTRS        Lisbeth Puller L 06/06/2016 3:02 PM 
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
## 2016-06-06 NOTE — Progress Notes (Signed)
Beth [name]. Thana was up and visible in milieu this evening, did attend and participate in evening group activity. [name] did endorse on-going depression and spoke about coming in for help with her depression. [name] also spoke about on-going sleep issues and spoke about how she has been prescribed trazodone but does not feel that it is helpful. She did receive bedtime medications without incident this evening. A. Support and encouragement provided. R. Safety maintained, will continue to monitor.

## 2016-06-07 DIAGNOSIS — Z825 Family history of asthma and other chronic lower respiratory diseases: Secondary | ICD-10-CM

## 2016-06-07 DIAGNOSIS — R45851 Suicidal ideations: Secondary | ICD-10-CM

## 2016-06-07 DIAGNOSIS — Z833 Family history of diabetes mellitus: Secondary | ICD-10-CM

## 2016-06-07 DIAGNOSIS — Z8249 Family history of ischemic heart disease and other diseases of the circulatory system: Secondary | ICD-10-CM

## 2016-06-07 DIAGNOSIS — F17210 Nicotine dependence, cigarettes, uncomplicated: Secondary | ICD-10-CM

## 2016-06-07 DIAGNOSIS — Z808 Family history of malignant neoplasm of other organs or systems: Secondary | ICD-10-CM

## 2016-06-07 DIAGNOSIS — Z79899 Other long term (current) drug therapy: Secondary | ICD-10-CM

## 2016-06-07 MED ORDER — TRAZODONE HCL 100 MG PO TABS: 100.0000 mg | ORAL_TABLET | Freq: Every evening | ORAL | Status: DC | PRN

## 2016-06-07 MED ORDER — TRAZODONE HCL 100 MG PO TABS
100.0000 mg | ORAL_TABLET | Freq: Every day | ORAL | Status: DC
  Administered 2016-06-07: 100 mg via ORAL
  Filled 2016-06-07: qty 3
  Filled 2016-06-07 (×2): qty 1

## 2016-06-07 MED ORDER — LITHIUM CARBONATE 150 MG PO CAPS
450.0000 mg | ORAL_CAPSULE | Freq: Every day | ORAL | Status: DC
  Administered 2016-06-07: 450 mg via ORAL
  Filled 2016-06-07: qty 9
  Filled 2016-06-07 (×3): qty 3

## 2016-06-07 MED ORDER — QUETIAPINE FUMARATE 100 MG PO TABS
100.0000 mg | ORAL_TABLET | Freq: Every day | ORAL | Status: DC
  Administered 2016-06-07: 100 mg via ORAL
  Filled 2016-06-07: qty 1
  Filled 2016-06-07: qty 3
  Filled 2016-06-07: qty 1

## 2016-06-07 NOTE — Progress Notes (Addendum)
D: Pt. is up and visible in the milieu, watching TV and appears to isolate herself from others. Denies having any HI/AVH/Pain at this time. Pt. endorses SI but verbal contracts for safety. Pt. presents with a depressed/anxious affect and mood. Pt. appears to be somatically focused on her anxiety & irritable on "not being on the right medications". Pt. did state one good thing about her day, "I will have my job".   A: Encouragement and support given. PRN's Vistaril and Trazodone requested and given. Will re-eval as necessary.   R: Safety maintained with Q 15 checks. Continues to follow treatment plan and will monitor closely. No questions/concerns at this time.

## 2016-06-07 NOTE — Progress Notes (Signed)
Recreation Therapy Notes  Date: 06/07/16 Time: 0930 Location: [address] Dayroom  Group Topic: Stress Management  Goal Area(s) Addresses:  Patient will verbalize importance of using healthy stress management.  Patient will identify positive emotions associated with healthy stress management.   Behavioral Response: Engaged  Intervention: Stress Management  Activity :  Diplomatic Services operational officereaceful Place.  LRT introduced the stress management technique of guided imagery.  LRT read script to engage patients in the technique.  Patients were to follow along as LRT read script.     Education:  Stress Management, Discharge Planning.   Education Outcome: Acknowledges edcuation/In group clarification offered/Needs additional education  Clinical Observations/Feedback:  Pt attended group.   Caroll RancherMarjette Nekeisha Aure, LRT/CTRS         Caroll RancherLindsay, Harriet Bollen A 06/07/2016 12:21 PM

## 2016-06-07 NOTE — [facility] Group Notes (Signed)
[facility] LCSW Group Therapy 06/07/2016  1:15 PM Type of Therapy: Group Therapy Participation Level: Active  Participation Quality: Attentive, Sharing and Supportive  Affect: Appropriate  Cognitive: Alert and Oriented  Insight: Developing/Improving and Engaged  Engagement in Therapy: Developing/Improving and Engaged  Modes of Intervention: Clarification, Confrontation, Discussion, Education, Exploration, Limit-setting, Orientation, Problem-solving, Rapport Building, Dance movement psychotherapisteality Testing, Socialization and Support  Summary of Progress/Problems: The topic for group today was emotional regulation. This group focused on both positive and negative emotion identification and allowed group members to process ways to identify feelings, regulate negative emotions, and find healthy ways to manage internal/external emotions. Group members were asked to reflect on a time when their reaction to an emotion led to a negative outcome and explored how alternative responses using emotion regulation would have benefited them. Group members were also asked to discuss a time when emotion regulation was utilized when a negative emotion was experienced. Patient engaged in therapeutic letter writing activity. She left group to speak with MD.   Beth BruinKristin Donnette Macmullen, MSW, LCSW Clinical Social Worker [facility] [phone]
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
## 2016-06-07 NOTE — Plan of Care (Addendum)
Problem: Safety: Goal: Periods of time without injury will increase Outcome: Progressing Pt. remains a low fall risk, denies HI, endorses SI but verbal contracts for safety,Q 15 checks in place for safety.

## 2016-06-07 NOTE — Progress Notes (Signed)
Patient was noted to be appropriately engaged on the milieu.  Patient attended groups and was compliant with all medications.  Patient denies SI, HI and AVH but reports increased anxiety.   Assess patient for safety, offer medications as prescribed, engage patient in 1:1 staff talks.   Continue to monitor as planned.  Patient able to contract for safety.

## 2016-06-07 NOTE — Progress Notes (Signed)
D: Pt. is up and visible in the milieu, seen playing cards and interacting with peers. Denies having any SI/HI/AVH/Pain at this time. Pt. presents with an anxious & depressed affect and mood. Pt. states "phone call with my mom earlier made me upset". Pt. proceeded to share feelings with Clinical research associatewriter. Pt. has calm down since and is doing much better. Pt. states " family session would be a good idea after D/C".   A: 1:1 interaction. Encouragement and support given. Meds. ordered and given. PRN requested and given. Will re-eval as necessary.   R: Safety maintained with Q 15 checks. Continues to follow treatment plan and will monitor closely. No questions/concerns at this time.

## 2016-06-07 NOTE — Tx Team (Signed)
Interdisciplinary Treatment and Diagnostic Plan Update  06/07/2016 Time of Session: 12:34 PM  Beth [name] MRN: [id number]   Primary Diagnosis:  Active Problems:   MDD (major depressive disorder), recurrent, severe, with psychosis ([place])   Current Medications:  Current Facility-Administered Medications  Medication Dose Route Frequency Provider Last Rate Last Dose  . acetaminophen (TYLENOL) tablet 650 mg  650 mg Oral Q6H PRN Niel Hummer, NP      . alum & mag hydroxide-simeth (MAALOX/MYLANTA) 200-200-20 MG/5ML suspension 30 mL  30 mL Oral Q4H PRN Niel Hummer, NP      . DULoxetine (CYMBALTA) DR capsule 20 mg  20 mg Oral Daily Sueanne Margarita, MD   20 mg at 06/07/16 0824  . hydrOXYzine (ATARAX/VISTARIL) tablet 25 mg  25 mg Oral Q6H PRN Niel Hummer, NP   25 mg at 06/06/16 2128  . magnesium hydroxide (MILK OF MAGNESIA) suspension 30 mL  30 mL Oral Daily PRN Niel Hummer, NP      . nicotine (NICODERM CQ - dosed in mg/24 hours) patch 21 mg  21 mg Transdermal Daily Jenne Campus, MD   21 mg at 06/07/16 0824  . traZODone (DESYREL) tablet 50 mg  50 mg Oral QHS PRN Niel Hummer, NP   50 mg at 06/06/16 2307    PTA Medications: Prescriptions Prior to Admission  Medication Sig Dispense Refill Last Dose  . escitalopram (LEXAPRO) 20 MG tablet Take 20 mg by mouth daily.  2 Past Week at Unknown time  . levonorgestrel (MIRENA) 20 MCG/24HR IUD 1 each by Intrauterine route once.   unknown  . traZODone (DESYREL) 100 MG tablet Take 200 mg by mouth at bedtime.  3 Past Week at Unknown time    Treatment Modalities: Medication Management, Group therapy, Case management,  1 to 1 session with clinician, Psychoeducation, Recreational therapy.   Physician Treatment Plan for Primary Diagnosis: MDD and Substance Abuse Long Term Goal(s): Improvement in symptoms so as ready for discharge  Short Term Goals: Ability to identify triggers associated with substance abuse/mental health issues will  improve  Medication Management: Evaluate patient's response, side effects, and tolerance of medication regimen.  Therapeutic Interventions: 1 to 1 sessions, Unit Group sessions and Medication administration.  Evaluation of Outcomes: Progressing  Physician Treatment Plan for Secondary Diagnosis: Active Problems:   MDD (major depressive disorder), recurrent, severe, with psychosis ([place])   Long Term Goal(s): Improvement in symptoms so as ready for discharge  Short Term Goals: Ability to identify changes in lifestyle to reduce recurrence of condition will improve  Medication Management: Evaluate patient's response, side effects, and tolerance of medication regimen.  Therapeutic Interventions: 1 to 1 sessions, Unit Group sessions and Medication administration.  Evaluation of Outcomes: Progressing   RN Treatment Plan for Primary Diagnosis:  MDD and Substance Abuse,  Long Term Goal(s): Knowledge of disease and therapeutic regimen to maintain health will improve  Short Term Goals: Ability to verbalize feelings will improve and Ability to identify and develop effective coping behaviors will improve  Medication Management: RN will administer medications as ordered by provider, will assess and evaluate patient's response and provide education to patient for prescribed medication. RN will report any adverse and/or side effects to prescribing provider.  Therapeutic Interventions: 1 on 1 counseling sessions, Psychoeducation, Medication administration, Evaluate responses to treatment, Monitor vital signs and CBGs as ordered, Perform/monitor CIWA, COWS, AIMS and Fall Risk screenings as ordered, Perform wound care treatments as ordered.  Evaluation of Outcomes:  Progressing   LCSW Treatment Plan for Primary Diagnosis: MDD and Substance Abuse Long Term Goal(s): Safe transition to appropriate next level of care at discharge, Engage patient in therapeutic group addressing interpersonal  concerns.  Short Term Goals: Engage patient in aftercare planning with referrals and resources  Therapeutic Interventions: Assess for all discharge needs, 1 to 1 time with Social worker, Explore available resources and support systems, Assess for adequacy in community support network, Educate family and significant other(s) on suicide prevention, Complete Psychosocial Assessment, Interpersonal group therapy.  Evaluation of Outcomes: Met  See below   Progress in Treatment: Attending groups: Yes Participating in groups: Yes Taking medication as prescribed: Yes Toleration medication: Yes, no side effects reported at this time Family/Significant other contact made: No Patient understands diagnosis: Yes AEB asking for help with anger issues, alcohol use Discussing patient identified problems/goals with staff: Yes Medical problems stabilized or resolved: Yes Denies suicidal/homicidal ideation: Yes Issues/concerns per patient self-inventory: None Other: N/A  New problem(s) identified: None identified at this time.   New Short Term/Long Term Goal(s): None identified at this time.   Discharge Plan or Barriers:  Pt will return home, follow up CD IOP at [facility] or [facility]  Reason for Continuation of Hospitalization: Mood instability Medication stabilization   Estimated Length of Stay: 3-4 days  Attendees: Patient: 06/07/2016  12:34 PM  Physician: Morrell Riddle, MD 06/07/2016  12:34 PM  Nursing: Gaylan Gerold, RN 06/07/2016  12:34 PM  RN Care Manager: Lars Pinks, RN 06/07/2016  12:34 PM  Social Worker: Delle Reining 06/07/2016  12:34 PM  Recreational Therapist: Laretta Bolster  06/07/2016  12:34 PM  Other: Norberto Sorenson 06/07/2016  12:34 PM  Other:  06/07/2016  12:34 PM    Scribe for Treatment Team:  Roque Lias 06/07/2016 12:34 PM

## 2016-06-07 NOTE — Plan of Care (Signed)
Problem: Activity: Goal: Sleeping patterns will improve Outcome: Progressing Pt. slept 5.75 hrs last night.   

## 2016-06-07 NOTE — [facility] Suicide Risk Assessment (Signed)
[facility] INPATIENT:  Family/Significant Other Suicide Prevention Education  Suicide Prevention Education:  Education Completed; Markus DaftBeth Wilson, mother, [phone]  has been identified by the patient as the family member/significant other with whom the patient will be residing, and identified as the person(s) who will aid the patient in the event of a mental health crisis (suicidal ideations/suicide attempt).  With written consent from the patient, the family member/significant other has been provided the following suicide prevention education, prior to the and/or following the discharge of the patient.  The suicide prevention education provided includes the following:  Suicide risk factors  Suicide prevention and interventions  National Suicide Hotline telephone number  [facility] assessment telephone number  [facility] Emergency Assistance 911  [facility] and/or Residential Mobile Crisis Unit telephone number  Request made of family/significant other to:  Remove weapons (e.g., guns, rifles, knives), all items previously/currently identified as safety concern.    Remove drugs/medications (over-the-counter, prescriptions, illicit drugs), all items previously/currently identified as a safety concern.  The family member/significant other verbalizes understanding of the suicide prevention education information provided.  The family member/significant other agrees to remove the items of safety concern listed above.  Ida RogueRodney B Chistian Kasler 06/07/2016, 4:37 PM

## 2016-06-08 MED ORDER — HYDROXYZINE HCL 25 MG PO TABS: 25.0000 mg | ORAL_TABLET | Freq: Four times a day (QID) | ORAL | 0 refills | Status: DC | PRN

## 2016-06-08 MED ORDER — NICOTINE 21 MG/24HR TD PT24: 21.0000 mg | MEDICATED_PATCH | Freq: Every day | TRANSDERMAL | 0 refills | Status: DC

## 2016-06-08 MED ORDER — TRAZODONE HCL 100 MG PO TABS: 100.0000 mg | ORAL_TABLET | Freq: Every day | ORAL | 0 refills | Status: DC

## 2016-06-08 MED ORDER — LITHIUM CARBONATE 150 MG PO CAPS: 450.0000 mg | ORAL_CAPSULE | Freq: Every day | ORAL | 0 refills | Status: DC

## 2016-06-08 MED ORDER — QUETIAPINE FUMARATE 100 MG PO TABS: 100.0000 mg | ORAL_TABLET | Freq: Every day | ORAL | 0 refills | Status: DC

## 2016-06-08 MED ORDER — DULOXETINE HCL 20 MG PO CPEP: 20.0000 mg | ORAL_CAPSULE | Freq: Every day | ORAL | 0 refills | Status: DC
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
## 2016-06-08 NOTE — Tx Team (Signed)
Interdisciplinary Treatment and Diagnostic Plan Update  06/08/2016 Time of Session: 10:57 AM  Beth [name] MRN: [id number]   Primary Diagnosis:  Active Problems:   MDD (major depressive disorder), recurrent, severe, with psychosis ([place])   Current Medications:  Current Facility-Administered Medications  Medication Dose Route Frequency Provider Last Rate Last Dose  . acetaminophen (TYLENOL) tablet 650 mg  650 mg Oral Q6H PRN Niel Hummer, NP      . alum & mag hydroxide-simeth (MAALOX/MYLANTA) 200-200-20 MG/5ML suspension 30 mL  30 mL Oral Q4H PRN Niel Hummer, NP      . DULoxetine (CYMBALTA) DR capsule 20 mg  20 mg Oral Daily Sueanne Margarita, MD   20 mg at 06/08/16 0745  . hydrOXYzine (ATARAX/VISTARIL) tablet 25 mg  25 mg Oral Q6H PRN Niel Hummer, NP   25 mg at 06/08/16 0041  . lithium carbonate capsule 450 mg  450 mg Oral QHS Sueanne Margarita, MD   450 mg at 06/07/16 2239  . magnesium hydroxide (MILK OF MAGNESIA) suspension 30 mL  30 mL Oral Daily PRN Niel Hummer, NP      . nicotine (NICODERM CQ - dosed in mg/24 hours) patch 21 mg  21 mg Transdermal Daily Jenne Campus, MD   21 mg at 06/08/16 0745  . QUEtiapine (SEROQUEL) tablet 100 mg  100 mg Oral QHS Sueanne Margarita, MD   100 mg at 06/07/16 2239  . traZODone (DESYREL) tablet 100 mg  100 mg Oral QHS Sueanne Margarita, MD   100 mg at 06/07/16 2239    PTA Medications: Prescriptions Prior to Admission  Medication Sig Dispense Refill Last Dose  . escitalopram (LEXAPRO) 20 MG tablet Take 20 mg by mouth daily.  2 Past Week at Unknown time  . levonorgestrel (MIRENA) 20 MCG/24HR IUD 1 each by Intrauterine route once.   unknown  . traZODone (DESYREL) 100 MG tablet Take 200 mg by mouth at bedtime.  3 Past Week at Unknown time    Treatment Modalities: Medication Management, Group therapy, Case management,  1 to 1 session with clinician, Psychoeducation, Recreational therapy.   Physician Treatment Plan for Primary Diagnosis: MDD and  Substance Abuse Long Term Goal(s): Improvement in symptoms so as ready for discharge  Short Term Goals: Ability to identify triggers associated with substance abuse/mental health issues will improve  Medication Management: Evaluate patient's response, side effects, and tolerance of medication regimen.  Therapeutic Interventions: 1 to 1 sessions, Unit Group sessions and Medication administration.  Evaluation of Outcomes: Adequate for Discharge  Physician Treatment Plan for Secondary Diagnosis: Active Problems:   MDD (major depressive disorder), recurrent, severe, with psychosis ([place])   Long Term Goal(s): Improvement in symptoms so as ready for discharge  Short Term Goals: Ability to identify changes in lifestyle to reduce recurrence of condition will improve  Medication Management: Evaluate patient's response, side effects, and tolerance of medication regimen.  Therapeutic Interventions: 1 to 1 sessions, Unit Group sessions and Medication administration.  Evaluation of Outcomes: Adequate for Discharge   RN Treatment Plan for Primary Diagnosis:  MDD and Substance Abuse,  Long Term Goal(s): Knowledge of disease and therapeutic regimen to maintain health will improve  Short Term Goals: Ability to verbalize feelings will improve and Ability to identify and develop effective coping behaviors will improve  Medication Management: RN will administer medications as ordered by provider, will assess and evaluate patient's response and provide education to patient for prescribed medication. RN will report any  adverse and/or side effects to prescribing provider.  Therapeutic Interventions: 1 on 1 counseling sessions, Psychoeducation, Medication administration, Evaluate responses to treatment, Monitor vital signs and CBGs as ordered, Perform/monitor CIWA, COWS, AIMS and Fall Risk screenings as ordered, Perform wound care treatments as ordered.  Evaluation of Outcomes: Adequate for Discharge      LCSW Treatment Plan for Primary Diagnosis: MDD and Substance Abuse Long Term Goal(s): Safe transition to appropriate next level of care at discharge, Engage patient in therapeutic group addressing interpersonal concerns.  Short Term Goals: Engage patient in aftercare planning with referrals and resources  Therapeutic Interventions: Assess for all discharge needs, 1 to 1 time with Social worker, Explore available resources and support systems, Assess for adequacy in community support network, Educate family and significant other(s) on suicide prevention, Complete Psychosocial Assessment, Interpersonal group therapy.  Evaluation of Outcomes: Met  See below   Progress in Treatment: Attending groups: Yes Participating in groups: Yes Taking medication as prescribed: Yes Toleration medication: Yes, no side effects reported at this time Family/Significant other contact made: Mother Patient understands diagnosis: Yes AEB asking for help with anger issues, alcohol use Discussing patient identified problems/goals with staff: Yes Medical problems stabilized or resolved: Yes Denies suicidal/homicidal ideation: Yes Issues/concerns per patient self-inventory: None Other: N/A  New problem(s) identified: None identified at this time.   New Short Term/Long Term Goal(s): None identified at this time.   Discharge Plan or Barriers:  Pt will return home, follow up CD IOP at [facility] or [facility]  Reason for Continuation of Hospitalization: Mood instability Medication stabilization   Estimated Length of Stay: DC within 48 hours  Attendees: Patient: 06/08/2016  10:57 AM  Physician: Morrell Riddle, MD 06/08/2016  10:57 AM  Nursing: Comer Locket, RN 06/08/2016  10:57 AM  RN Care Manager: Lars Pinks, RN 06/08/2016  10:57 AM  Social Worker: Edwyna Shell LCSW 06/08/2016  10:57 AM  Recreational Therapist: 06/08/2016  10:57 AM  Other:  06/08/2016  10:57 AM  Other:  06/08/2016  10:57 AM     Scribe for Treatment Team:  Edwyna Shell 06/08/2016 10:57 AM

## 2016-06-08 NOTE — Progress Notes (Deleted)
  [facility] :  Will you be returning to the same living situation after discharge:  Yes,  return home At discharge, do you have transportation home?: Yes,  mother Do you have the ability to pay for your medications: Yes,  no concerns expressed  Release of information consent forms completed and in the chart;  Patient's signature needed at discharge.  Patient to Follow up at: Follow-up Information    [facility] Follow up on 06/13/2016.   Specialty:  Behavioral Health Why:  Tuesday at 1:00 with Charmian MuffAnn Evans to start the CD IOP program. Contact information: [address] [place] [id number] [phone]          Next level of care provider has access to [facility] Link:yes  Safety Planning and Suicide Prevention discussed: Yes,  w mother and patient  Have you used any form of tobacco in the last 30 days? (Cigarettes, Smokeless Tobacco, Cigars, and/or Pipes): Yes  Has patient been referred to the Quitline?: Patient refused referral  Patient has been referred for addiction treatment: Yes  Sallee Langenne C Ambera Fedele 06/08/2016, 3:29 PM

## 2016-06-08 NOTE — BHH Group Notes (Signed)
Type of Therapy: Mental Health Association Presentation  Participation Level: Active  Participation Quality: Attentive  Affect: Appropriate  Cognitive: Oriented  Insight: Developing/Improving  Engagement in Therapy: Engaged  Modes of Intervention: Discussion, Education and Socialization  Summary of Progress/Problems: Mental Health Association (MHA) Speaker came to talk about his personal journey with substance abuse and addiction. The pt processed ways by which to relate to the speaker. MHA speaker provided handouts and educational information pertaining to groups and services offered by the [facility]. Pt was engaged in speaker's presentation and was receptive to resources provided.

## 2016-06-08 NOTE — BHH Suicide Risk Assessment (Addendum)
[facility] Discharge Suicide Risk Assessment   Principal Problem: <principal problem not specified> Discharge Diagnoses:  Patient Active Problem List   Diagnosis Date Noted  . MDD (major depressive disorder), recurrent, severe, with psychosis ([place]) [F33.3] 06/05/2016  . Routine general medical examination at a health care facility [Z00.00] 12/02/2015  . Elevated LFTs [R79.89] 12/02/2015  I and the patient's RN met with patient's Parent and patient when they came to pick up patient and asked to speak with me. Provided substance/psych and medication education, update and transition plan to them along with answering their questions and adressing their concerns. Thay understand that patient is no longer suicidal and the mood is stable enough for to participate in outpatient substance and psychiatric care without further delay  Total Time spent with patient: 50 minutes  Musculoskeletal: Strength & Muscle Tone: within normal limits Gait & Station: normal Patient leans: N/A  Psychiatric Specialty Exam: ROS  Blood pressure 111/82, pulse (!) 150, temperature 97.8 F (36.6 C), temperature source Oral, resp. rate 18, height 5' (1.524 m), weight 63.5 kg (140 lb), SpO2 100 %.Body mass index is 27.34 kg/m.  General Appearance: Casual  Eye Contact::  Good  Speech:  Clear and Coherent and Normal Rate409  Volume:  Normal  Mood:  Euthymic  Affect:  Congruent  Thought Process:  Coherent and Goal Directed  Orientation:  Full (Time, Place, and Person)  Thought Content:  Logical  Suicidal Thoughts:  No  Homicidal Thoughts:  No  Memory:  Immediate;   Good Recent;   Good Remote;   Good  Judgement:  Good  Insight:  Fair  Psychomotor Activity:  Normal  Concentration:  Good  Recall:  Good  Fund of Knowledge:Good  Language: Good  Akathisia:  No  Handed:  Right  AIMS (if indicated):     Assets:  Communication Skills Desire for Improvement Housing Physical Health Social Support Vocational/Educational   Sleep:  Number of Hours: 6.5  Cognition: WNL  ADL's:  Intact   Mental Status Per Nursing Assessment::   On Admission:     Demographic Factors:  Caucasian  Loss Factors: NA  Historical Factors: Family history of mental illness or substance abuse  Risk Reduction Factors:   Employed, Living with another person, especially a relative, Positive social support and Positive therapeutic relationship  Continued Clinical Symptoms:  None  Cognitive Features That Contribute To Risk:  None    Suicide Risk:  Minimal: No identifiable suicidal ideation.  Patients presenting with no risk factors but with morbid ruminations; may be classified as minimal risk based on the severity of the depressive symptoms  Follow-up [facility] .   Specialty:  Behavioral Health Contact information: [place] [phone]          Plan Of Care/Follow-up recommendations:  Activity:  Patient will keep outpatieny appointments and continue current medications  Ruffin Frederick, MD 06/08/2016, 10:55 AM

## 2016-06-08 NOTE — Progress Notes (Signed)
Patient ID: Beth RuaRachel E Calvario, female   DOB: September 29, 1990, 25 y.o.   MRN: [id number]   Patient was discharged to home/self care in the company of her parents. Patient's parents were very concerned about her discharge plans and spoke with the physician prior to discharge.  Patient acknowledged understanding of all discharge and follow up instructions. Patient stated that she was excited to discharge and was looking forward to continuing her treatment on an outpatient basis.

## 2016-06-08 NOTE — Progress Notes (Signed)
  [facility] :  Will you be returning to the same living situation after discharge:  No. Going to stay with father At discharge, do you have transportation home?: Yes,  family Do you have the ability to pay for your medications: Yes,  insurance  Release of information consent forms completed and in the chart;  Patient's signature needed at discharge.  Patient to Follow up at: Follow-up Information    [facility] Follow up on 06/13/2016.   Specialty:  Behavioral Health Why:  Tuesday at 1:00 with Charmian MuffAnn [name] to start the CD IOP program. Contact information: [address] [name]          Next level of care provider has access to [facility] Link:no  Safety Planning and Suicide Prevention discussed: Yes,  yes  Have you used any form of tobacco in the last 30 days? (Cigarettes, Smokeless Tobacco, Cigars, and/or Pipes): Yes  Has patient been referred to the Quitline?: Patient refused referral  Patient has been referred for addiction treatment: Yes  Beth [name] 06/08/2016, 2:41 PM

## 2016-06-13 ENCOUNTER — Encounter (HOSPITAL_COMMUNITY): Payer: Self-pay | Admitting: Psychology

## 2016-06-13 ENCOUNTER — Ambulatory Visit (HOSPITAL_COMMUNITY): Payer: BLUE CROSS/BLUE SHIELD | Admitting: Psychology

## 2016-06-13 DIAGNOSIS — F1020 Alcohol dependence, uncomplicated: Secondary | ICD-10-CM

## 2016-06-13 DIAGNOSIS — F1220 Cannabis dependence, uncomplicated: Secondary | ICD-10-CM

## 2016-06-13 DIAGNOSIS — F333 Major depressive disorder, recurrent, severe with psychotic symptoms: Secondary | ICD-10-CM
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
## 2016-06-14 ENCOUNTER — Other Ambulatory Visit (HOSPITAL_COMMUNITY): Payer: BLUE CROSS/BLUE SHIELD | Attending: Psychiatry | Admitting: Psychology

## 2016-06-14 ENCOUNTER — Encounter (HOSPITAL_COMMUNITY): Payer: Self-pay | Admitting: Medical

## 2016-06-14 VITALS — BP 108/68 | HR 83 | Ht 60.0 in | Wt 140.0 lb

## 2016-06-14 DIAGNOSIS — Z8659 Personal history of other mental and behavioral disorders: Secondary | ICD-10-CM | POA: Diagnosis not present

## 2016-06-14 DIAGNOSIS — F17210 Nicotine dependence, cigarettes, uncomplicated: Secondary | ICD-10-CM

## 2016-06-14 DIAGNOSIS — Z811 Family history of alcohol abuse and dependence: Secondary | ICD-10-CM

## 2016-06-14 DIAGNOSIS — T7432XS Child psychological abuse, confirmed, sequela: Secondary | ICD-10-CM

## 2016-06-14 DIAGNOSIS — Z806 Family history of leukemia: Secondary | ICD-10-CM

## 2016-06-14 DIAGNOSIS — R45851 Suicidal ideations: Secondary | ICD-10-CM

## 2016-06-14 DIAGNOSIS — F1511 Other stimulant abuse, in remission: Secondary | ICD-10-CM

## 2016-06-14 DIAGNOSIS — Z5189 Encounter for other specified aftercare: Secondary | ICD-10-CM | POA: Diagnosis not present

## 2016-06-14 DIAGNOSIS — Z808 Family history of malignant neoplasm of other organs or systems: Secondary | ICD-10-CM

## 2016-06-14 DIAGNOSIS — F1021 Alcohol dependence, in remission: Secondary | ICD-10-CM | POA: Insufficient documentation

## 2016-06-14 DIAGNOSIS — Z79899 Other long term (current) drug therapy: Secondary | ICD-10-CM

## 2016-06-14 DIAGNOSIS — Z6372 Alcoholism and drug addiction in family: Secondary | ICD-10-CM | POA: Diagnosis not present

## 2016-06-14 DIAGNOSIS — Z825 Family history of asthma and other chronic lower respiratory diseases: Secondary | ICD-10-CM

## 2016-06-14 DIAGNOSIS — Z91040 Latex allergy status: Secondary | ICD-10-CM

## 2016-06-14 DIAGNOSIS — Z8249 Family history of ischemic heart disease and other diseases of the circulatory system: Secondary | ICD-10-CM

## 2016-06-14 DIAGNOSIS — F1220 Cannabis dependence, uncomplicated: Secondary | ICD-10-CM | POA: Diagnosis not present

## 2016-06-14 DIAGNOSIS — F4310 Post-traumatic stress disorder, unspecified: Secondary | ICD-10-CM | POA: Insufficient documentation

## 2016-06-14 DIAGNOSIS — Z87898 Personal history of other specified conditions: Secondary | ICD-10-CM | POA: Diagnosis not present
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
## 2016-06-15 ENCOUNTER — Other Ambulatory Visit (HOSPITAL_COMMUNITY): Payer: BLUE CROSS/BLUE SHIELD | Admitting: Psychology

## 2016-06-15 DIAGNOSIS — F1220 Cannabis dependence, uncomplicated: Secondary | ICD-10-CM

## 2016-06-15 DIAGNOSIS — F1021 Alcohol dependence, in remission: Secondary | ICD-10-CM

## 2016-06-15 DIAGNOSIS — F1511 Other stimulant abuse, in remission: Secondary | ICD-10-CM

## 2016-06-15 DIAGNOSIS — F4310 Post-traumatic stress disorder, unspecified: Secondary | ICD-10-CM

## 2016-06-16 ENCOUNTER — Encounter (HOSPITAL_COMMUNITY): Payer: Self-pay | Admitting: Psychology
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
## 2016-06-16 NOTE — Progress Notes (Signed)
    Daily Group Progress Note  Program: CD-IOP   06/16/2016 Beth [name] [id number]  Diagnosis:  Alcohol use disorder, severe, in early remission, in controlled environment, dependence ([place])  Cannabis use disorder, severe, dependence ([place])  Post traumatic stress disorder (PTSD)  Hx of amphetamine abuse   Sobriety Date: 06/14/16  Group Time: 1-2:30  Participation Level: Active  Behavioral Response: Appropriate and Sharing  Type of Therapy: Process Group  Interventions: Motivational Interviewing  Topic: Patients met with counselors for 1.5 hour group process session. Patients were active and engaged. No drug tests were collected. One member graduated successfully and was honored with a small graduation token and parting words. Counselors used open questions and reflection to elicit exploration of patients' recovery journeys and early sobriety. Patients responded significantly to one member's stories of "feeling excluded" and experiencing prejudice and harassment at work.     Group Time: 2:30-4  Participation Level: Active  Behavioral Response: Appropriate and Sharing  Type of Therapy: Psycho-education Group  Interventions: CBT, Motivational Interviewing, Solution Focused and Family Systems  Topic: Patients met with counselors for 1.5 hour group psychoeducation session. Patients were active and engaged during the "family sculpture" psychodrama activity. One member shared his family sculpture and allowed other members to comment and deepen his understanding of his family history. Patient's story was traumatic and raised many emotions in the room. Group members shared their feelings of pain and sympathy towards him. Patient responded well and seemed happy to share the situation with others "for the first time in his life".   Summary: Patient presented as active and engaged during group session. She shared that she attended 1 AA meeting last night with her roommate.  Patient was disappointed in her roommate who was on her cell phone during the whole meetings. Patient stated she felt that AA "was sacred to some people" and her friend was disrespecting it. The group offered her feedback on focusing on herself and not on other people. Patient agreed and stated it was hard to focus on herself only. Patient reported she took notes on her readings in the big book and pamphlets that she was given during the 12 step meeting. Patient is vocal and expressive in group and shares her opinion freely despite being relatively new. Patient seems focused on social justice issues and not on her own sobriety. Patient vocalized deep appreciation for the group and stated she looks forward to coming. Youlanda Roys, LPC-A   UDS collected: No Results:   AA/NA attended?: YesWednesday  Sponsor?: No   [name], LCAS 06/16/2016 8:31 AM

## 2016-06-19 ENCOUNTER — Other Ambulatory Visit (HOSPITAL_COMMUNITY): Payer: Self-pay | Admitting: Medical

## 2016-06-19 ENCOUNTER — Other Ambulatory Visit (HOSPITAL_COMMUNITY): Payer: BLUE CROSS/BLUE SHIELD | Admitting: Psychology

## 2016-06-19 DIAGNOSIS — F1220 Cannabis dependence, uncomplicated: Secondary | ICD-10-CM

## 2016-06-19 DIAGNOSIS — Z5189 Encounter for other specified aftercare: Secondary | ICD-10-CM | POA: Diagnosis not present

## 2016-06-19 DIAGNOSIS — F1021 Alcohol dependence, in remission: Secondary | ICD-10-CM

## 2016-06-19 DIAGNOSIS — F4310 Post-traumatic stress disorder, unspecified: Secondary | ICD-10-CM

## 2016-06-19 NOTE — Progress Notes (Signed)
Beth [name] is a 25 y.o. female patient. CD-IOP: Treatment Planning Session. The counselor met with the patient for the initial treatment planning session today. The patient's sobriety date is 11/1. The counselor began the session by going through their professional disclosure statement and asking the client to sign a consent form for a friend. The patient identified the goals of maintaining sobriety from mind-altering alcohol and drugs as well as gaining interpersonal support during recovery. This support is identified as attending 4 12-step meetings a week during treatment and gaining a sponsor. In addition to the goals around her recovery, the client identified that she wanted to work on improving her self-care through adhering to medication guidelines, having a balanced diet, and exercising regularly. She also identified that she wanted to begin looking for other jobs while on [employer]. During the remainder of the session, the client shared more about family dynamics that may impact her recovery. She asked that the counselor schedule a group session with her father to discuss regaining possession of her car. The client talked about additional support systems and what this may look like in recovery. This first session went well and the goals for treatment were identified and the treatment plan completed accordingly. The counselor and patient agreed to meet weekly every Tuesday afternoon at 3 pm. The next appointment is scheduled for 11/7 at 3 pm. We will follow closely in the days ahead.         [name], LCAS

## 2016-06-20 ENCOUNTER — Encounter (HOSPITAL_COMMUNITY): Payer: Self-pay | Admitting: Psychology

## 2016-06-21 ENCOUNTER — Other Ambulatory Visit (HOSPITAL_COMMUNITY): Payer: BLUE CROSS/BLUE SHIELD | Admitting: Psychology

## 2016-06-21 ENCOUNTER — Encounter (HOSPITAL_COMMUNITY): Payer: Self-pay | Admitting: Psychology

## 2016-06-21 DIAGNOSIS — F1020 Alcohol dependence, uncomplicated: Secondary | ICD-10-CM

## 2016-06-21 DIAGNOSIS — F4310 Post-traumatic stress disorder, unspecified: Secondary | ICD-10-CM

## 2016-06-21 DIAGNOSIS — Z5189 Encounter for other specified aftercare: Secondary | ICD-10-CM | POA: Diagnosis not present

## 2016-06-21 DIAGNOSIS — F1220 Cannabis dependence, uncomplicated: Secondary | ICD-10-CM
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
## 2016-06-22 ENCOUNTER — Other Ambulatory Visit (HOSPITAL_COMMUNITY): Payer: BLUE CROSS/BLUE SHIELD | Admitting: Psychology

## 2016-06-22 DIAGNOSIS — F1220 Cannabis dependence, uncomplicated: Secondary | ICD-10-CM

## 2016-06-22 DIAGNOSIS — F1021 Alcohol dependence, in remission: Secondary | ICD-10-CM

## 2016-06-22 DIAGNOSIS — F4310 Post-traumatic stress disorder, unspecified: Secondary | ICD-10-CM

## 2016-06-22 DIAGNOSIS — Z5189 Encounter for other specified aftercare: Secondary | ICD-10-CM | POA: Diagnosis not present

## 2016-06-22 LAB — URINE DRUG SCREEN 701203 - MAT
6-Acetylmorphine: NEGATIVE ng/mL
AMPHETAMINES UR: NEGATIVE ng/mL
BARBITURATES UR: NEGATIVE ng/mL
Benzodiazepines: NEGATIVE ng/mL
Buprenorphine: NEGATIVE ng/mL
CARISOPRODOL UR: NEGATIVE ng/mL
CREATININE UR: 72 mg/dL (ref >=20)
Cocaine Metabolite: NEGATIVE ng/mL
ETHYL GLUCURONIDE UR: NEGATIVE ng/mL
Fentanyl: NEGATIVE ng/mL
Gabapentin: NEGATIVE ug/mL
METHADONE UR: NEGATIVE ng/mL
NITRITES UR: NEGATIVE ug/mL
Opiates: NEGATIVE ng/mL
Oxycodone: NEGATIVE ng/mL
PROPOXYPHENE UR: NEGATIVE ng/mL
Phencyclidine (PCP): NEGATIVE ng/mL
Tapentadol: NEGATIVE ng/mL
Tramadol: NEGATIVE ng/mL
Urine pH: 5.5 (ref 4.5–8.9)

## 2016-06-22 LAB — CARBOXY-THC,NORMALIZED RATIO
CARBOXY-THC UR: 69 ng/mL
THC/CR RATIO UR: 96 ng/mg{creat}

## 2016-06-22 NOTE — Progress Notes (Signed)
Beth [name] is a 25 y.o. female patient. CD-IOP: Individual Session: The client met with the patient for the second individual session on 11/7. The client was active and engaged throughout session. The client confirmed that her sobriety date is still 11/1. She discussed the previous meetings she had attended, and the counselor provided information regarding LGBT friendly meetings. She discussed feeling isolated in recovery - many of the people she had associated with actively use mind-altering chemicals. While she is meeting people in the rooms, there is still a desire to connect with others. The counselor discussed how she could continue to meet new people who would be supportive of her recovery. The patient discussed her relationship with her roommate at some length. The counselor encouraged the patient to role-play using assertive communication and the patient avoided doing so. The counselor brought up a theme from the group session on Monday which led the patient to share a piece of her trauma history that the counselor was previously unaware of. The patient admitted to "having PTSD" following the event and wishes to discuss it with the counselor further. The counselor encouraged her to continue processing the event and connected it into a larger pattern of feeling isolated and unsupported by others. The patient readily agreed to the pattern and was able to provide additional insight. The patient discussed how her previous diagnoses had been used against her, particularly by family members to invalidate her.  She shared about her history of eating disorders and how her substance use had been an extension of the binging/purging pattern. At the end of the session the patient confirmed that her final day of work was 10/22. The counselor discussed the possibility of scheduling a family session with her father, and tentatively planned to do so in the next two weeks. The next individual appointment is scheduled  for 11/14 at 3 pm. The patient responded well to this intervention.         [name], LCAS
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
## 2016-06-25 ENCOUNTER — Encounter (HOSPITAL_COMMUNITY): Payer: Self-pay | Admitting: Psychology
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
## 2016-06-26 ENCOUNTER — Encounter (HOSPITAL_COMMUNITY): Payer: Self-pay | Admitting: Psychology

## 2016-06-26 ENCOUNTER — Other Ambulatory Visit (HOSPITAL_COMMUNITY): Payer: Self-pay | Admitting: Medical

## 2016-06-26 ENCOUNTER — Other Ambulatory Visit (HOSPITAL_COMMUNITY): Payer: BLUE CROSS/BLUE SHIELD

## 2016-06-26 NOTE — Progress Notes (Signed)
Beth [name] is a 25 y.o. female patient. CD-IOP: Excused Absence. The patient appeared for group today, but she was sick. She insisted she was not contagious, but she sounded horrible and clearly wasn't herself. The group and counselors encouraged her to go home and stay in bed. Hopefully, she will feel well enough to come to the next session on Wednesday. The patient will be excused from group today.         Charmian MuffAnn Caidynce Muzyka, LCAS

## 2016-06-28 ENCOUNTER — Encounter (HOSPITAL_COMMUNITY): Payer: Self-pay | Admitting: Psychology

## 2016-06-28 ENCOUNTER — Other Ambulatory Visit (HOSPITAL_COMMUNITY): Payer: BLUE CROSS/BLUE SHIELD | Admitting: Psychology

## 2016-06-28 DIAGNOSIS — F4310 Post-traumatic stress disorder, unspecified: Secondary | ICD-10-CM

## 2016-06-28 DIAGNOSIS — Z5189 Encounter for other specified aftercare: Secondary | ICD-10-CM | POA: Diagnosis not present

## 2016-06-28 DIAGNOSIS — F1220 Cannabis dependence, uncomplicated: Secondary | ICD-10-CM

## 2016-06-28 DIAGNOSIS — F1020 Alcohol dependence, uncomplicated: Secondary | ICD-10-CM

## 2016-06-29 ENCOUNTER — Other Ambulatory Visit (HOSPITAL_COMMUNITY): Payer: BLUE CROSS/BLUE SHIELD | Admitting: Psychology

## 2016-06-29 DIAGNOSIS — F1020 Alcohol dependence, uncomplicated: Secondary | ICD-10-CM

## 2016-06-29 DIAGNOSIS — Z5189 Encounter for other specified aftercare: Secondary | ICD-10-CM | POA: Diagnosis not present

## 2016-06-29 DIAGNOSIS — F4310 Post-traumatic stress disorder, unspecified: Secondary | ICD-10-CM

## 2016-06-29 DIAGNOSIS — F1220 Cannabis dependence, uncomplicated: Secondary | ICD-10-CM

## 2016-06-29 LAB — URINE DRUG SCREEN 701203 - MAT
6-Acetylmorphine: NEGATIVE ng/mL
Amphetamines: NEGATIVE ng/mL
Barbiturates: NEGATIVE ng/mL
Benzodiazepines: NEGATIVE ng/mL
Buprenorphine: NEGATIVE ng/mL
CREATININE UR: 129.9 mg/dL (ref >=20)
Carisoprodol: NEGATIVE ng/mL
Cocaine Metabolite: NEGATIVE ng/mL
ETHYL GLUCURONIDE UR: NEGATIVE ng/mL
Fentanyl: NEGATIVE ng/mL
GABAPENTIN UR: NEGATIVE ug/mL
METHADONE UR: NEGATIVE ng/mL
NITRITES UR: NEGATIVE ug/mL
OXYCODONE UR: NEGATIVE ng/mL
Opiates: NEGATIVE ng/mL
PHENCYCLIDINE (PCP) UR: NEGATIVE ng/mL
Propoxyphene: NEGATIVE ng/mL
Tapentadol: NEGATIVE ng/mL
Tramadol: NEGATIVE ng/mL
URINE PH: 6 (ref 4.5–8.9)

## 2016-06-29 LAB — CARBOXY-THC,NORMALIZED RATIO
CARBOXY-THC UR: 98 ng/mL
THC/CR Ratio: 75 ng/mg Creat

## 2016-06-30 ENCOUNTER — Encounter: Payer: Self-pay | Admitting: Family

## 2016-07-03 ENCOUNTER — Other Ambulatory Visit (HOSPITAL_COMMUNITY): Payer: BLUE CROSS/BLUE SHIELD | Admitting: Psychology

## 2016-07-03 ENCOUNTER — Other Ambulatory Visit (HOSPITAL_COMMUNITY): Payer: Self-pay | Admitting: Medical

## 2016-07-03 ENCOUNTER — Encounter (HOSPITAL_COMMUNITY): Payer: Self-pay | Admitting: Psychology

## 2016-07-03 DIAGNOSIS — F1220 Cannabis dependence, uncomplicated: Secondary | ICD-10-CM

## 2016-07-03 DIAGNOSIS — Z5189 Encounter for other specified aftercare: Secondary | ICD-10-CM | POA: Diagnosis not present

## 2016-07-03 DIAGNOSIS — F4310 Post-traumatic stress disorder, unspecified: Secondary | ICD-10-CM

## 2016-07-03 DIAGNOSIS — F1020 Alcohol dependence, uncomplicated: Secondary | ICD-10-CM

## 2016-07-03 DIAGNOSIS — F333 Major depressive disorder, recurrent, severe with psychotic symptoms: Secondary | ICD-10-CM

## 2016-07-03 MED ORDER — DULOXETINE HCL 20 MG PO CPEP: 20.0000 mg | ORAL_CAPSULE | Freq: Every day | ORAL | 2 refills | Status: DC

## 2016-07-03 MED ORDER — HYDROXYZINE HCL 25 MG PO TABS: 25.0000 mg | ORAL_TABLET | Freq: Four times a day (QID) | ORAL | 2 refills | Status: DC | PRN

## 2016-07-03 MED ORDER — QUETIAPINE FUMARATE 100 MG PO TABS: 100.0000 mg | ORAL_TABLET | Freq: Every day | ORAL | 2 refills | Status: DC

## 2016-07-03 MED ORDER — TRAZODONE HCL 100 MG PO TABS: 100.0000 mg | ORAL_TABLET | Freq: Every day | ORAL | 2 refills | Status: DC

## 2016-07-03 MED ORDER — LITHIUM CARBONATE 150 MG PO CAPS: 450.0000 mg | ORAL_CAPSULE | Freq: Every day | ORAL | 2 refills | Status: DC

## 2016-07-03 NOTE — Progress Notes (Addendum)
    Daily Group Progress Note  Program: CD-IOP   07/03/2016 Beth [name] [id number]  Diagnosis:  No diagnosis found.   Sobriety Date: 11/1  Group Time: 1-2:30 pm  Participation Level: Active  Behavioral Response: Appropriate  Type of Therapy: Process Group  Interventions: Supportive  Topic: Process: Counselors met with patients to discuss recovery-related activities they had engaged in to support their sobriety. Two new patients introduced themselves to the group and shared reasons for pursuing treatment. All members were active and engaged in the discussion concerning recovery from mind-altering drugs and alcohol.  Group Time: 2:30-4 pm  Participation Level: Active  Behavioral Response: Sharing  Type of Therapy: Psycho-education Group  Interventions: Strength-based  Topic: Psychoeducation: Counselors led a discussion on the serenity prayer and how it related to recovery. Patients were asked to list things they had control over verses things they did not and share them with the group. A group member met with the program director to discuss discharge procedures. Counselors concluded the group by discussing holiday scheduling and the importance of prioritizing recovery.   Summary: The patient presented as active and engaged in group. She shared that she was feeling "better" after having taken several days to rest/recover following her illness. The past few days she reported feeling stuck in her recovery - she had been unable to attend meetings due to being ill. She said it was "easy to spiral" into hopelessness when she does not feel well. The group encouraged her to seek out sources of support. She discussed the feasibility of moving out of her apartment, as it is often a triggering space for her to be in. The patient was quiet for most of the psychoeducational portion of the group but actively commented on what others had shared. The patient was unable to attend any  meetings because she was sick.    UDS collected: No Results:   AA/NA attended?: No  Sponsor?: No   [name], LCAS 07/03/2016 5:06 PM

## 2016-07-04 ENCOUNTER — Encounter (HOSPITAL_COMMUNITY): Payer: Self-pay | Admitting: Psychology

## 2016-07-04 DIAGNOSIS — F1020 Alcohol dependence, uncomplicated: Secondary | ICD-10-CM

## 2016-07-04 DIAGNOSIS — F333 Major depressive disorder, recurrent, severe with psychotic symptoms: Secondary | ICD-10-CM

## 2016-07-04 DIAGNOSIS — F4310 Post-traumatic stress disorder, unspecified: Secondary | ICD-10-CM

## 2016-07-04 DIAGNOSIS — F1220 Cannabis dependence, uncomplicated: Secondary | ICD-10-CM

## 2016-07-04 NOTE — Progress Notes (Signed)
Beth [name] is a 25 y.o. female patient. CD-IOP: Individual Counseling Session. Patient met with the counselor for their weekly session. We usually meet on Tuesday afternoons, but the patient was not feeling well yesterday. The counselor inquired about the group session that had just ended and what she had disclosed in that session. The patient shared that she had been getting along better with her roommate and has been focusing on not getting angry as quickly. She shared that she had been listening to meetings online and that this had been helpful when she was sick and not leaving the house. The patient expressed that she did not want her illness to interrupt her goal of completing the 90-in-90. The counselor encouraged her to attend meetings when she felt well enough to do so. The counselor inquired about SI, and the patient revealed that she had passive ideation over the weekend. The counselor assessed for safety and asked the patient to identify two people she would reach out to if these thoughts reoccurred. It was determined that the SI was not active, and that the patient did not have a plan or access to means. The counselor plans to have a formal safety plan written up for the next session. The patient briefly shared about her Thanksgiving plans. The counselor confirmed she would meet with the patient next Tuesday at 3 pm. They will solidify a plan that will keep her sober over the holiday. The patient is making progress towards her first treatment goal of total abstinence and has remained sober since 11/1. She has engaged in the Fellowship of NA and is meeting other young people in recovery. The patient confirmed she is taking all of her medications as prescribed. The patient responded well in the session and we will continue to follow closely in the days and weeks ahead.         [name], LCAS

## 2016-07-05 ENCOUNTER — Other Ambulatory Visit (HOSPITAL_COMMUNITY): Payer: BLUE CROSS/BLUE SHIELD | Admitting: Psychology

## 2016-07-05 ENCOUNTER — Other Ambulatory Visit (HOSPITAL_COMMUNITY): Payer: Self-pay

## 2016-07-05 ENCOUNTER — Encounter (HOSPITAL_COMMUNITY): Payer: Self-pay | Admitting: Psychology

## 2016-07-05 DIAGNOSIS — Z8659 Personal history of other mental and behavioral disorders: Secondary | ICD-10-CM

## 2016-07-05 DIAGNOSIS — F4310 Post-traumatic stress disorder, unspecified: Secondary | ICD-10-CM

## 2016-07-05 DIAGNOSIS — F1020 Alcohol dependence, uncomplicated: Secondary | ICD-10-CM

## 2016-07-05 DIAGNOSIS — Z5189 Encounter for other specified aftercare: Secondary | ICD-10-CM | POA: Diagnosis not present

## 2016-07-05 DIAGNOSIS — F1220 Cannabis dependence, uncomplicated: Secondary | ICD-10-CM

## 2016-07-05 DIAGNOSIS — F333 Major depressive disorder, recurrent, severe with psychotic symptoms: Secondary | ICD-10-CM

## 2016-07-05 MED ORDER — LITHIUM CARBONATE 150 MG PO CAPS: 450.0000 mg | ORAL_CAPSULE | Freq: Every day | ORAL | 2 refills | Status: DC

## 2016-07-05 MED ORDER — HYDROXYZINE HCL 25 MG PO TABS: 25.0000 mg | ORAL_TABLET | Freq: Four times a day (QID) | ORAL | 2 refills | Status: DC | PRN

## 2016-07-05 MED ORDER — TRAZODONE HCL 100 MG PO TABS: 100.0000 mg | ORAL_TABLET | Freq: Every day | ORAL | 2 refills | Status: DC

## 2016-07-05 MED ORDER — DULOXETINE HCL 20 MG PO CPEP: 20.0000 mg | ORAL_CAPSULE | Freq: Every day | ORAL | 2 refills | Status: DC

## 2016-07-05 MED ORDER — QUETIAPINE FUMARATE 100 MG PO TABS: 100.0000 mg | ORAL_TABLET | Freq: Every day | ORAL | 2 refills | Status: DC

## 2016-07-05 NOTE — Progress Notes (Signed)
Beth RuaRachel E Banner is a 25 y.o. female patient. CD-IOP: Individual Counseling Session. The counselor began the session by inquiring about her activities since the previous session. The patient shared that she had spent time with a friend and entered into the possibility of a romantic attachment with this individual. The counselor discussed the negative consequences of becoming emotionally involved in early recovery. The counselor then asked the client to complete a safety plan in response to past suicidal ideation. The patient completed the form herself and took a copy for her records. The counselor then discussed her holiday plans and the pros and cons of spending time with her father. The counselor plans to check in during the next IOP regarding her holiday plans. Lastly, the counselor shared that the office had attempted to follow up on her FMLA and short-term disability paperwork but had been unable to make contact. The counselor plans to schedule a family session with the patient and her father sometime in the upcoming week. In the meantime, the patient is making progress and remains alcohol and drug free with a sobriety date of 11/1. She remains compliant with the rules of this program and is attending 12-step meetings consistently. The patient responded well to this intervention. We will continue to follow her closely in the days ahead. The next individual session is scheduled for 11/28 at 3 pm.         Charmian MuffAnn Irene Collings, LCAS

## 2016-07-05 NOTE — Progress Notes (Signed)
    Daily Group Progress Note  Program: CD-IOP   07/05/2016 Beth [name] [id number]  Diagnosis:  No diagnosis found.   Sobriety Date: 11/1  Group Time: 1-2:30 pm  Participation Level: Active  Behavioral Response: Appropriate  Type of Therapy: Process Group  Interventions: Supportive  Topic: Process: Counselors met with patients to discuss recovery-related activities they had engaged in to support their sobriety. Two group members shared that they had returned to use. A new patient introduced himself to the group and shared reasons for pursuing treatment. All members were active and engaged in the discussion concerning recovery from mind-altering drugs and alcohol.  Group Time: 2:30-4 pm  Participation Level: Active  Behavioral Response: Sharing  Type of Therapy: Psycho-education Group  Interventions: Solution Focused  Topic: Psychoeducation: Counselors facilitated a discussion on relapse-prevention techniques. Group members filled out and discussed a checklist of situations they had encountered during their recovery. Information was provided regarding 12-step meetings during the holiday season. The counselors asked patients to share holiday plans as they related to recovery. A new group member met with the Investment banker, operational.   Summary: The patient presented as active and engaged in group. Since the previous group session she had attended a meditation meeting and reported having a "moving" experience that brought her to tears. She had been able to discuss boundaries with a person she met at an NA meeting, which is something she has struggled with in the past. She attended her first [place] meeting and reported having a positive experience, including getting a temporary sponsor. During this time, she had been "guilt tripped" into having dinner with her dad and step-mom. The group provided positive feedback and encouragement regarding her experience. She shared that in the  past she had used as a way of simultaneously punishing and rewarding herself and that anger was a trigger. The patient attended four [place] meetings.    UDS collected: No Results:  AA/NA attended?: YesThursday, Friday, Saturday and Sunday  Sponsor?: Yes   [name], LCAS 07/05/2016 6:24 PM

## 2016-07-09 ENCOUNTER — Encounter (HOSPITAL_COMMUNITY): Payer: Self-pay | Admitting: Psychology

## 2016-07-09 NOTE — Progress Notes (Signed)
    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 Members discussed what they are doing to support their recovery and any challenges or obstacles that may have appeared since we last met. A new group member was present today and she introduced herself during this half of group.   Group Time: 2:30-4 pm  Participation Level: Active  Behavioral Response: Sharing  Type of Therapy: Activity Group/Graduation  Interventions: Strength-based  Topic:Psycho-Ed; Emotional Jenga/Graduation: the second half of group was spent in a psycho-ed. The topic was "Emotional Jenga" and consisted of group members circling their chairs around a table with the Zihlman tower in the middle. Members chose the pieces they drew and were asked to use them in a sentence and how they would address the emotion in recovery. There was good feedback and fun in this exercise. A graduation ceremony was held 20 minutes prior to the end of the session. Members enjoyed brownies and spoke to the graduating member. Her husband had arrived and was also present for this ceremony. She received validating loving compliments and wishes, and the ceremony proved very poignant.   Summary: The patient reported she is feeling much better today after being sick and having to leave group on Monday. She admitted she has been at home in bed most of the time and spent time 'talking to my cat'. She admitted she is having 'horrible nightmares' and she is always using drugs in these nightmares. The patient reported she had gone to '[employer]' and paid $9 for her drink this morning. Members laughed at the high price of this drink and the patient's expressed poverty status.  The patient reported she had listened to an NA meeting last night online and is was very painful. She recognized that she might be an 'egomaniac with a self-esteem problem'. The patient was engaged in the activity and shared how the emotional words she drew from [place]. The patient shared very thoughtful and kind words to the graduating member who had been very supportive and encouraging of this young woman when she first came to the program. The patient was appropriate in group today and responded well to this intervention. We will continue to follow closely in the days ahead.   UDS collected: No Results:   AA/NA attended?: YesWednesday  Sponsor?: No   [name], LCAS 07/09/2016 12:22 PM

## 2016-07-09 NOTE — Progress Notes (Signed)
    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   Sobriety Date: 11/1  Group Time: 1-2:30 pm  Participation Level: Active  Behavioral Response: Sharing and Grandiose, attention seeking  Type of Therapy: Process Group  Interventions: Supportive  Topic: Process: The first half of group was spent in process. Group members described the things they had done since we last met to support their sobriety. They were also encouraged to share any thoughts of using, cravings or challenges to their recovery. The program director met with 2 group members and drug tests were collected from every member except for the member who was graduating today. During their check-ins, members shared briefly about their plans over the upcoming holiday weekend.   Group Time: 2:30-4 pm  Participation Level: Active  Behavioral Response: Blaming  Type of Therapy: Psycho-education Group  Interventions: Solution Focused  Topic: Psycho-Ed: Relapse Prevention, Part 2/Graduation: the second half of group was spent in a psycho-ed on relapse prevention. It was a continuation of the psycho-ed on Monday. Members reviewed the handout provided during the last session and a discussion ensued on challenging thoughts of using in early recovery. Members shared openly about some of their ambivalence, which is to be expected. During the last 20 minutes of group today a graduation ceremony was held to honor a member who was completing the program. His wife had arrived and joined the celebration as did a member who had graduated last week. Kind words and brownies were shared as the session ended.   Summary: The patient reported she had attended a new AA meeting last night, despite having planned on going to the LGBT she had planned on attending. She explained that her sponsor had insisted she go to this other meeting and she had acquiesced as instructed.  The meeting had not gone well, and she had been cut off by the chair of the meeting. The patient reported it had been embarrassing and she was humiliated. The patient also disclosed she had slept with someone on Monday night, despite being encouraged to focus on herself and avoid intimat4 relationship in early recovery. She still wore that shame in the session today. The patient received helpful feedback from her fellow group members, who sought to comfort her from this painful experience. When asked about the holiday, the patient admitted the plans seem to have changed and her father and step-mother have invited her to come to their home. Her plans were not clear. In the psycho-ed, the patient was quiet, but attentive. She was clearly withdrawn from engaging today, contrary to her usual presentation. The patient shared kind words with the graduating member and thanked him for his kindness and acceptance. The patient was not herself today and she seems surprisingly upset about the confrontation at the women's AA meeting. She assured the group that she would follow through on plans and stay sober over the weekend. We will continue to follow closely in the days ahead. A drug test was collected from this patient today.    UDS collected: Yes Results: pending  AA/NA attended?: YesTuesday  Sponsor?: Yes   [name], LCAS 07/09/2016 10:54 AM

## 2016-07-10 ENCOUNTER — Other Ambulatory Visit (HOSPITAL_COMMUNITY): Payer: BLUE CROSS/BLUE SHIELD | Admitting: Psychology

## 2016-07-10 DIAGNOSIS — F4310 Post-traumatic stress disorder, unspecified: Secondary | ICD-10-CM

## 2016-07-10 DIAGNOSIS — Z5189 Encounter for other specified aftercare: Secondary | ICD-10-CM | POA: Diagnosis not present

## 2016-07-10 DIAGNOSIS — F333 Major depressive disorder, recurrent, severe with psychotic symptoms: Secondary | ICD-10-CM

## 2016-07-10 DIAGNOSIS — F1220 Cannabis dependence, uncomplicated: Secondary | ICD-10-CM

## 2016-07-10 DIAGNOSIS — F1020 Alcohol dependence, uncomplicated: Secondary | ICD-10-CM

## 2016-07-11 ENCOUNTER — Encounter (HOSPITAL_COMMUNITY): Payer: Self-pay | Admitting: Psychology

## 2016-07-11 NOTE — Progress Notes (Signed)
    Daily Group Progress Note  Program: CD-IOP   07/11/2016 Beth [name] [id number]  Diagnosis:  Alcohol use disorder, severe, dependence ([facility])  Cannabis use disorder, severe, dependence ([name])  MDD (major depressive disorder), recurrent, severe, with psychosis ([name])  Post traumatic stress disorder (PTSD)   Sobriety Date: 06/14/16  Group Time: 1-2:30  Participation Level: Active  Behavioral Response: Appropriate, Sharing and Agitated  Type of Therapy: Process Group  Interventions: CBT and Motivational Interviewing  Topic: Counselors met with patients for 1.5 hour group processing session. Patients were active and engaged and discussed the recent holiday break, challenges to their recovery process, and family interactions. UDS were collected from all patients present. 2 group members shared about lapsing over the break. One patient was absent with no known attempt to communicate. Another group member was absent and called to say she was sick at home. Some patients met with program director to discuss MAT.     Group Time: 2:30-4  Participation Level: Active  Behavioral Response: Appropriate and Sharing  Type of Therapy: Psycho-education Group  Interventions: Solution Focused and Strength-based  Topic: Counselors met with patients for 1.5 hour group psychoeducation session. Counselor led topical discussion on "Adult Children of Alcoholics". Patients identified characteristics of healthy and unhealthy families. Counselor also led a 5 minute guided imagery meditation about "Coping with Change". Patient processed their response to activity and meditation.   Summary: Patient was active and engaged in group session. She presented as aggravated and moderately tearful when discussing the problematic holiday break. She reported she was not speaking to her mother after a heated argument and that she was frustrated with her family since the have differing political beliefs.  Patient stated she attended 1 AA meeting since last group. She reported that she was in contact with a temporary sponsor. Patient reported that she if feeling hopeful since she is beginning to connect with her higher power, "the universe and it's ability to connect [place] all". Patient was encouraged and supported throughout her time today since she seemed particularly on edge and disappointed with herself. Youlanda Roys, LPCA   UDS collected: Yes Results: negative  AA/NA attended?: YesFriday, Saturday and Sunday  Sponsor?: No   [name], LCAS 07/11/2016 2:56 PM

## 2016-07-12 ENCOUNTER — Other Ambulatory Visit (INDEPENDENT_AMBULATORY_CARE_PROVIDER_SITE_OTHER): Payer: BLUE CROSS/BLUE SHIELD | Admitting: Psychology

## 2016-07-12 ENCOUNTER — Encounter (HOSPITAL_COMMUNITY): Payer: Self-pay | Admitting: Medical

## 2016-07-12 DIAGNOSIS — F1020 Alcohol dependence, uncomplicated: Secondary | ICD-10-CM | POA: Diagnosis not present

## 2016-07-12 DIAGNOSIS — Z5189 Encounter for other specified aftercare: Secondary | ICD-10-CM | POA: Diagnosis not present

## 2016-07-12 DIAGNOSIS — F419 Anxiety disorder, unspecified: Secondary | ICD-10-CM

## 2016-07-12 DIAGNOSIS — F1511 Other stimulant abuse, in remission: Secondary | ICD-10-CM

## 2016-07-12 DIAGNOSIS — Z6372 Alcoholism and drug addiction in family: Secondary | ICD-10-CM

## 2016-07-12 DIAGNOSIS — Z87898 Personal history of other specified conditions: Secondary | ICD-10-CM

## 2016-07-12 DIAGNOSIS — F4310 Post-traumatic stress disorder, unspecified: Secondary | ICD-10-CM

## 2016-07-12 DIAGNOSIS — F1220 Cannabis dependence, uncomplicated: Secondary | ICD-10-CM

## 2016-07-12 DIAGNOSIS — H93233 Hyperacusis, bilateral: Secondary | ICD-10-CM

## 2016-07-12 DIAGNOSIS — F323 Major depressive disorder, single episode, severe with psychotic features: Secondary | ICD-10-CM | POA: Diagnosis not present

## 2016-07-12 MED ORDER — BUPROPION HCL ER (XL) 300 MG PO TB24: 300.0000 mg | ORAL_TABLET | ORAL | 2 refills | Status: DC

## 2016-07-12 MED ORDER — QUETIAPINE FUMARATE 100 MG PO TABS: ORAL_TABLET | ORAL | 2 refills | Status: DC

## 2016-07-12 NOTE — Progress Notes (Signed)
I

## 2016-07-12 NOTE — Progress Notes (Signed)
Beth [name] is a 25 y.o. female patient.  CD-IOP: Individual Counseling Session Note: 07/11/16  The counselor had their weekly individual counseling session with the patient on 07/11/16. The counselor checked in on how the patient has been doing since the group session the afternoon before. The patient reported feelings of hopelessness, a lack of energy/motivation, as well as intermittent tears. She reports that these feelings have been present for "about a week." The counselor asked about SI, and the patient reported having passive ideation thoughts but had no intent to act on them. The counselor conducted a suicide assessment and reminded the patient of the safety plan she had completed during the previous session. The counselor inquired about the effectiveness of the plan, and the patient reported using it when her thoughts began. She shared the activities she had planned for the evening (spending time with her roommate, attending a meeting, going to bed early) and the next morning before group (meeting with sponsor). The counselor reminded her to reach out for help when needed. During the remainder of the session the counselor used a values card sort to help the patient identify what qualities she would prefer in a future job. She talked about a position that she had applied for, and the counselor facilitated a discussion about the pros and cons of accepting the position. She had a question regarding another counselor's comment from the previous group session, and the counselor encouraged her to ask the other counselor about it. The other counselor happened to stop by and the patient was able to discuss her concerns. The counselor also discussed her diagnosis and relevant DSM criteria as it related to her medications. The patient feels that the medication may not be working as indicated, and the counselor encouraged her to meet with the program director to discuss medication management. The counselor  concluded the session by tentatively scheduling a family session for the following week so she could meet with her father. The nex tindividual session is scheduled for 07/18/16 at 3 pm.       [facility] CHEM

## 2016-07-12 NOTE — Progress Notes (Signed)
Patient ID: Beth RuaRachel E Clune, female   DOB: [birth year], 25 y.o.   MRN: [id number] Pt seen at bherv request to discuss increasing mood changes/diagnoses/medications and othervissues (Hyperacusis)

## 2016-07-13 ENCOUNTER — Other Ambulatory Visit (HOSPITAL_COMMUNITY): Payer: BLUE CROSS/BLUE SHIELD | Admitting: Psychology

## 2016-07-13 DIAGNOSIS — F1020 Alcohol dependence, uncomplicated: Secondary | ICD-10-CM

## 2016-07-13 DIAGNOSIS — F323 Major depressive disorder, single episode, severe with psychotic features: Secondary | ICD-10-CM

## 2016-07-13 DIAGNOSIS — F4310 Post-traumatic stress disorder, unspecified: Secondary | ICD-10-CM

## 2016-07-13 DIAGNOSIS — F419 Anxiety disorder, unspecified: Secondary | ICD-10-CM

## 2016-07-13 DIAGNOSIS — Z5189 Encounter for other specified aftercare: Secondary | ICD-10-CM | POA: Diagnosis not present

## 2016-07-14 NOTE — Progress Notes (Signed)
    Daily Group Progress Note  Program: CD-IOP   07/14/2016 Beth [name] [id number]  Diagnosis:  Severe single current episode of major depressive disorder, with psychotic features ([facility]) - Plan: Lithium level, CANCELED: Lithium level  Post traumatic stress disorder (PTSD)  Chronic anxiety  Alcohol use disorder, severe, dependence ([facility])  Hyperacusis, bilateral  Cannabis use disorder, severe, dependence ([facility])  Hx of amphetamine abuse  Dysfunctional family due to alcoholism   Sobriety Date: 06/14/16  Group Time: 1-2:30  Participation Level: Active  Behavioral Response: Appropriate and Sharing  Type of Therapy: Process Group  Interventions: CBT  Topic: Counselors met with patients to discuss the activities they had engaged in to support their recovery since the previous session. A new group member introduced himself to the group and shared his reasons for pursuing treatment. Most members were active and engaged in the discussion concerning recovery from mind-altering drugs and alcohol.      Group Time: 2:30-4  Participation Level: Active  Behavioral Response: Appropriate and Sharing  Type of Therapy: Psycho-education Group  Interventions: Strength-based and Supportive  Topic: Counselors continued the discussion from the previous session about characteristics of Adult Children of Alcoholics. The group members finished reading though the laundry list and were asked to identify which characteristics they personally identified with. The counselors then helped the patients create personal measurable goals for their treatment based on the characteristic they identified. Two group members met with the Investment banker, operational. A drug test was collected from one group member. PHQ-9 and GAD assessments were collected from all group members.    Summary: The patient presented as active and engaged in group. She shared that she had been "feeling shitty" for the past week due to  depressive symptoms (over sleeping, crying, lack of motivation, etc). She shared that she has been "pushing" herself to do things even when she is not motivated to do so. The group encouraged her to continue trying. She shared that she had a job offer that she was contemplating. During the psychoed portion she shared that she "doesn't want to be alone" and often "doubles down" against ultimatums. She recognizes that both of these behaviors can be problematic in certain contexts. The patient met with the program director to discuss medication management. PHQ-9: 19 GAD: 15. The patient attended 2 AA meetings. WEs Swan, LPCA    UDS collected: No Results: negative  AA/NA attended?: YesTuesday and Wednesday  Sponsor?: Yes   [name], LCAS 07/14/2016 1:15 PM

## 2016-07-14 NOTE — Progress Notes (Signed)
    Daily Group Progress Note  Program: CD-IOP   07/14/2016 DEANA KROCK [id number]  Diagnosis:  Post traumatic stress disorder (PTSD)  Severe single current episode of major depressive disorder, with psychotic features ([facility])  Chronic anxiety  Alcohol use disorder, severe, dependence ([facility])   Sobriety Date: 06/14/16  Group Time: 1-2:30  Participation Level: Active  Behavioral Response: Appropriate, Sharing and Agitated  Type of Therapy: Process Group  Interventions: CBT and Supportive  Topic: Counselor met with patients for 1.5 hour group process therapy session. Patients were active, engaged, and talked lively. Patients spoke on their recovery and sobriety from mind-altering drugs and alcohol. One UDS was collected from patient.      Group Time: 2:30-4  Participation Level: Active  Behavioral Response: Appropriate  Type of Therapy: Psycho-education Group  Interventions: Meditation: MBRP  Topic: Counselor met with patients for 1.5 hour group psychoeducation therapy session. Counselor led discussing on and experience of mindfulness based relapse prevention (MBRP) for using meditation during recovery from mind-altering drugs and alcohol. Patients were moderately interested and presented as tired and somewhat disengaged.   Summary: Patient presented for CD-IOP and was active and engaged during group. She presented with closed body posture, tearfulness throughout session, and seemed severely depressed. Patient had an active check-in with pressured speech and anxiety about "realizing she is not bipolar". Patient reported CD-IOP program director Darlyne Russian is adding Wellbutrin to her medications with hopes that her depression begins to lift. Patient showed increasing insight during her check in about "not being able to control people or outcomes". Patient reported having a 2.5 hour meeting with her sponsor and did step work which was "good but intense". Patient seemed  engaged during psychoeducation and meditation and verbalized her experience of MBRP to the group. Patient was able to verbalize specific plans for her upcoming weekend which support her sobriety. Youlanda Roys, LPCA    UDS collected: No Results: negative  AA/NA attended?: YesWednesday  Sponsor?: Yes   [name], LCAS 07/14/2016 12:46 PM

## 2016-07-17 ENCOUNTER — Other Ambulatory Visit (HOSPITAL_COMMUNITY): Payer: BLUE CROSS/BLUE SHIELD | Attending: Psychiatry | Admitting: Psychology

## 2016-07-17 DIAGNOSIS — F4310 Post-traumatic stress disorder, unspecified: Secondary | ICD-10-CM

## 2016-07-17 DIAGNOSIS — F333 Major depressive disorder, recurrent, severe with psychotic symptoms: Secondary | ICD-10-CM | POA: Insufficient documentation

## 2016-07-17 DIAGNOSIS — F323 Major depressive disorder, single episode, severe with psychotic features: Secondary | ICD-10-CM

## 2016-07-17 DIAGNOSIS — Z6372 Alcoholism and drug addiction in family: Secondary | ICD-10-CM | POA: Insufficient documentation

## 2016-07-17 DIAGNOSIS — Z8659 Personal history of other mental and behavioral disorders: Secondary | ICD-10-CM

## 2016-07-17 DIAGNOSIS — T7432XS Child psychological abuse, confirmed, sequela: Secondary | ICD-10-CM

## 2016-07-17 DIAGNOSIS — F419 Anxiety disorder, unspecified: Secondary | ICD-10-CM | POA: Insufficient documentation

## 2016-07-17 DIAGNOSIS — F1020 Alcohol dependence, uncomplicated: Secondary | ICD-10-CM | POA: Diagnosis not present

## 2016-07-17 DIAGNOSIS — Z87898 Personal history of other specified conditions: Secondary | ICD-10-CM | POA: Insufficient documentation

## 2016-07-17 DIAGNOSIS — F1220 Cannabis dependence, uncomplicated: Secondary | ICD-10-CM | POA: Diagnosis not present

## 2016-07-17 NOTE — Progress Notes (Signed)
Beth [name] is a 25 y.o. female patient.  CD-IOP: Individual Counseling Session Note: The counselor had their weekly individual counseling session with the patient on 07/11/16. The counselor checked in on how the patient has been doing since the group session the afternoon before. The patient reported feelings of hopelessness, a lack of energy/motivation, as well as intermittent tears. She reports that these feelings have been present for "about a week." The counselor asked about SI, and the patient reported having passive ideation thoughts but had no intent to act on them. The counselor conducted a suicide assessment and reminded the patient of the safety plan she had completed during the previous session. The counselor inquired about the effectiveness of the plan, and the patient reported using it when her thoughts began. She shared the activities she had planned for the evening (spending time with her roommate, attending a meeting, going to bed early) and the next morning before group (meeting with sponsor). The counselor reminded her to reach out for help when needed. During the remainder of the session, the counselor used a values card sort to help the patient identify what qualities she would prefer in a future job. She talked about a position that she had applied for, and the counselor facilitated a discussion about the pros and cons of accepting the position. She had a question regarding another counselor's comment from the previous group session, and the counselor encouraged her to ask the other counselor about it. The other counselor happened to stop by and the patient was able to discuss her concerns. The counselor also discussed her diagnosis and relevant DSM criteria as it related to her medications. The patient feels that the medication may not be working as indicated, and the counselor encouraged her to meet with the program director to discuss medication management. The counselor concluded the  session by tentatively scheduling a family session for the following week so she could meet with her father. The next individual session is scheduled for 07/18/16 at 3 pm.         Beth MuffAnn Lakrisha [name], LCAS

## 2016-07-18 ENCOUNTER — Encounter (HOSPITAL_COMMUNITY): Payer: Self-pay | Admitting: Psychology

## 2016-07-18 NOTE — Progress Notes (Signed)
Beth [name] is a 25 y.o. female patient.  CD-IOP Individual Session: 07/18/16  Counselor met with the patient for weekly individual session. The counselor began the session by asking about the client's upcoming appointment regarding sexual health. This led to a dialogue about sexual history and partners, and having safe sex. The patient brought up the possibility of informing former partners and discussed the pros and cons of doing so before having an official confirmation from medical providers. The counselor then asked about activities the patient had participated in since the group session on Monday. She shared that her roommate has actively been using marijuana and that this has led the patient to have a desire to use. The counselor then discussed potential boundaries the patient could set. The patient shared frustrations she had regarding the gendered language of AA and how that had impacted her experience. The counselor used immediacy to further the discussion. The patient shared more about current emotions, particularly around meeting with her mother on Thursday. The counselor asked about potential topics of discussion and the patient expressed an interest in working on the interpersonal interactions, particularly name calling and using diagnoses as an invalidation technique. The counselor shared some about the framework of the session and what it will look like. The counselor assessed for SI and the client shared about continued passive ideation. The counselor conducted a safety assessment and reminded the patient about the safety plan from a previous session. The patient then concluded the session by calling her job and asking for the short-term disability paperwork. The patient then forwarded this to the counselor via email. She shared that she may have to miss group tomorrow for a doctor's appointment. The next session is scheduled for Thursday, December 7th at 10 am.       [name], LCAS

## 2016-07-19 ENCOUNTER — Encounter (HOSPITAL_COMMUNITY): Payer: Self-pay | Admitting: Psychology

## 2016-07-19 ENCOUNTER — Other Ambulatory Visit (HOSPITAL_COMMUNITY): Payer: BLUE CROSS/BLUE SHIELD

## 2016-07-19 NOTE — Progress Notes (Signed)
Daily Group Progress Note  Program: CD-IOP   07/19/2016 Beth [name] [id number]  Diagnosis:  No diagnosis found.   Sobriety Date: 11/1  Group Time: 1-2:30 pm  Participation Level: Active  Behavioral Response: Sharing, Blaming and Agitated  Type of Therapy: Process Group  Interventions: Supportive  Topic: Process: The first half f group was spent in process. Members shared about the past weekend and any struggles or challenges they had faced in early recovery. They also identified the various ways they had supported and strengthened their recovery. During group today, three group members met with the program director, including the newest member. Four drug tests were collected today.   Group Time: 2:30-4 pm  Participation Level: Active  Behavioral Response: Sharing  Type of Therapy: Psycho-education Group  Interventions: Solution Focused  Topic: Psycho-Ed: The Wheel of Life. The second half of group was spent in a psycho-ed. A handout was provided that included 'Assessing Your Life Balance". Members colored in the six different sections and identified how satisfied they were in each category. These categories included Physical, financial, intellectual, emotional, social and spiritual. Members were active and engaged in sharing their findings, including identifying where they felt strongest and in what area they might want to improve upon.  Summary: The patient reported a healthy weekend that became more obviously dysfunctional and disastrous as she described the events of the past few days. She described a very uncomfortable public interaction with her family that proved very upsetting to them and hurtful to her. Looking back, she lamented having made the decisions she did and regretting them. Her speech was pressed and once she began talking, the patient shared many details of her weekend without taking a breath. As this patient observed her fellow group members, there  was little eye contact and no feedback or validation as she described her weekend. The patient described how her sister and mother had told her she was psychotic and crazy and invalidated everything she was saying. It was painful to hear her share these experiences, she seemed very hurt, and angry as she disclosed them. Despite the difficult weekend, the patient reported she had picked up her 30-day chip and it brought validation and applause from her fellow group members. In the psycho-ed, the patient shared that she would like to address her 'social' category and become more effective at communicating and relating to others. The patient was engaged, but seems oblivious to how dysfunctional her daily life is, compared to her fellow group members. It seems as if she has blow-ups and problems with other people frequently. At it appears as though most of those incidents are of her doing. She remains alcohol and drug-free, as evidenced by her drug tests, but it remains to be seen if she can remain abstinent amid this chaos.    UDS collected: Yes Results:pending  AA/NA attended?: YesThursday and Saturday  Sponsor?: Yes    , LCAS 07/19/2016 8:00 AM 

## 2016-07-20 ENCOUNTER — Encounter (HOSPITAL_COMMUNITY): Payer: Self-pay | Admitting: Psychology

## 2016-07-20 ENCOUNTER — Other Ambulatory Visit (HOSPITAL_COMMUNITY): Payer: BLUE CROSS/BLUE SHIELD | Admitting: Psychology

## 2016-07-20 DIAGNOSIS — F4310 Post-traumatic stress disorder, unspecified: Secondary | ICD-10-CM

## 2016-07-20 DIAGNOSIS — F333 Major depressive disorder, recurrent, severe with psychotic symptoms: Secondary | ICD-10-CM

## 2016-07-20 DIAGNOSIS — F1020 Alcohol dependence, uncomplicated: Secondary | ICD-10-CM | POA: Diagnosis not present

## 2016-07-21 ENCOUNTER — Encounter (HOSPITAL_COMMUNITY): Payer: Self-pay | Admitting: Psychology

## 2016-07-21 NOTE — Progress Notes (Signed)
Beth [name] is a 25 y.o. female patient. CD-IOP: Family Session. Two counselors met with the patient and her mother for a family session. The patient had requested the meeting to address their interpersonal communication. The counselor began the session by sharing what the patient had accomplished during her time in CD-IOP. The patient's mother shared more about her personal experience and identified themes of guilt and resentment. She shared some underlying expectations she had for her daughter and how they came up within their relationship. The patient was able to talk through some of these issues and identified aspects of their communication that frustrated her. They agreed to discuss some potential boundaries in their communication. The counselor encouraged them to consider approaching the other with curiosity rather than reacting instinctively. Both mother and daughter identified that they wanted to feel connected and how family dynamics often interfered. The counselors encouraged her mother to attend Al-Anon to gain a better understanding of recovery. The counselor plans to meet with the patient individually on 07/25/16 at 3 pm. In the meantime, besides numerous challenges that have appeared before this patient she has remained alcohol and drug-free. Her sobriety date is 11/1. (written by Ander Purpura May, counseling intern).        [name], LCAS
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
## 2016-07-24 ENCOUNTER — Other Ambulatory Visit (HOSPITAL_COMMUNITY): Payer: Self-pay | Admitting: Medical

## 2016-07-24 ENCOUNTER — Other Ambulatory Visit (HOSPITAL_COMMUNITY): Payer: BLUE CROSS/BLUE SHIELD | Admitting: Psychology

## 2016-07-24 DIAGNOSIS — F1220 Cannabis dependence, uncomplicated: Secondary | ICD-10-CM

## 2016-07-24 DIAGNOSIS — F333 Major depressive disorder, recurrent, severe with psychotic symptoms: Secondary | ICD-10-CM

## 2016-07-24 DIAGNOSIS — F1020 Alcohol dependence, uncomplicated: Secondary | ICD-10-CM | POA: Diagnosis not present

## 2016-07-24 DIAGNOSIS — F4310 Post-traumatic stress disorder, unspecified: Secondary | ICD-10-CM

## 2016-07-25 ENCOUNTER — Encounter (HOSPITAL_COMMUNITY): Payer: Self-pay | Admitting: Psychology

## 2016-07-25 NOTE — Progress Notes (Signed)
CD-IOP: Individual session with counselor. The counselor began the session by inquiring about how the patient was doing since the previous group session. The counselor and patient revisited the goals from the treatment planning session and updated them. The patient has remained sober from mind altering chemicals since 06/14/16 and continues to regularly attend 12-step meetings. She has a temporary sponsor through AA that she meets with regularly. The additional goals she identified included self-care and looking for a job. She is taking medication as prescribed, but has not been able to exercise as regularly as she had hoped. The counselor discussed what the goals of having a balanced diet, establishing a regular sleep schedule, and regular exercise might look like moving forward. The patient plans to meet with the program director to discuss medication management and complete the short-term disability forms. The patient completed her portion of the short-term disability paperwork and plans to complete the remaining section with the program director. The patient talked about current relationships and setting boundaries. She realizes that in order to focus on her recovery she will have to change the people, places, and things she does. The counselor discussed how she can meet new people who will support her recovery. The counselor checked in following the family session last Thursday and asked for feedback. The patient shared that the session had been helpful and that she was attempting to maintain the boundaries established in that session. The counselor discussed what future sessions may look like as the patient will begin working with another counselor in CD-IOP. The next individual session is scheduled for 08/01/16 with Charmian MuffAnn Aniesa Boback.

## 2016-07-26 ENCOUNTER — Encounter (HOSPITAL_COMMUNITY): Payer: Self-pay | Admitting: Medical

## 2016-07-26 ENCOUNTER — Encounter (HOSPITAL_COMMUNITY): Payer: Self-pay | Admitting: Psychology

## 2016-07-26 ENCOUNTER — Other Ambulatory Visit (INDEPENDENT_AMBULATORY_CARE_PROVIDER_SITE_OTHER): Payer: BLUE CROSS/BLUE SHIELD | Admitting: Psychology

## 2016-07-26 DIAGNOSIS — F4310 Post-traumatic stress disorder, unspecified: Secondary | ICD-10-CM

## 2016-07-26 DIAGNOSIS — Z87898 Personal history of other specified conditions: Secondary | ICD-10-CM

## 2016-07-26 DIAGNOSIS — H93233 Hyperacusis, bilateral: Secondary | ICD-10-CM

## 2016-07-26 DIAGNOSIS — Z6372 Alcoholism and drug addiction in family: Secondary | ICD-10-CM

## 2016-07-26 DIAGNOSIS — F333 Major depressive disorder, recurrent, severe with psychotic symptoms: Secondary | ICD-10-CM | POA: Diagnosis not present

## 2016-07-26 DIAGNOSIS — F419 Anxiety disorder, unspecified: Secondary | ICD-10-CM

## 2016-07-26 DIAGNOSIS — F1020 Alcohol dependence, uncomplicated: Secondary | ICD-10-CM | POA: Diagnosis not present

## 2016-07-26 DIAGNOSIS — Z8659 Personal history of other mental and behavioral disorders: Secondary | ICD-10-CM

## 2016-07-26 DIAGNOSIS — F1511 Other stimulant abuse, in remission: Secondary | ICD-10-CM

## 2016-07-26 DIAGNOSIS — F1220 Cannabis dependence, uncomplicated: Secondary | ICD-10-CM | POA: Diagnosis not present

## 2016-07-26 DIAGNOSIS — T7432XS Child psychological abuse, confirmed, sequela: Secondary | ICD-10-CM

## 2016-07-26 NOTE — Progress Notes (Addendum)
    Daily Group Progress Note  Program: CD-IOP   07/26/2016 ESME FREUND [id number]  Diagnosis:  Alcohol use disorder, severe, dependence ([place])  Cannabis use disorder, severe, dependence ([place])  MDD (major depressive disorder), recurrent, severe, with psychosis ([place])  Post traumatic stress disorder (PTSD)   Sobriety Date: 11/1  Group Time: 1-2:30 pm  Participation Level: Active  Behavioral Response: Appropriate  Type of Therapy: Process Group  Interventions: Supportive  Topic: Process: The first half of group was spent in process. Members shared about the past weekend and what they had done to support their sobriety. They were also asked to identify any challenges, temptations or even successes that they experienced since we last met. Drug tests were collected from all present. The program director met with one of the group members for a medication check.   Group Time: 2:30-4 pm  Participation Level: Minimal  Behavioral Response: Sharing  Type of Therapy: Psycho-education Group  Interventions: Strength-based  Topic: Psycho-Ed: The Disease Model of Addiction. The second half of group was spent in a psycho-ed on the disease model of addiction. The four elements, or Bio-Psycho-Social- Spiritual aspects of addiction were discussed.  A slide show was provided on the neurobiology of addiction. The conversation was focused on understanding the biological nature of addiction. Members shared that it was helpful to know that their addiction wasn't the results of some inadequacy on their part, but rather a combination of things, of which they had little awareness.   Summary: The patient reported that the snow had kept her in all day on Friday. She admitted she was 'stir crazy'. At the same time, she pointed out that this was the first time it had snowed, and she had not gotten 'smashed'. It was unusual for her and this was validated by her fellow group members. The patient  reported she had picked up a 'resentment chip' and explained that she had recognized her father really made her angry and she says things back to him that were intentionally hurtful. The patient reported she had attended an White meeting and she was the only woman among a room full of men. When she asked if they would consider changing the language to gender neutral, there was a mixed response. She questioned whether she would return to that meeting. In the psycho-ed, the patient reported it felt good to hear that this disease was biologically-based and not something she has because she is weak or has no discipline. The patient's presentation has been markedly different in group sessions over the past 3-4 sessions and it is unclear whether she is sinking into a deeper depression or other issues are contributing to this change. She appears to be withdrawing and shutting down. Despite these struggles, her drug tests have been negative, and she has remained abstinent for almost 40 days. It must be noted that this patient was the only female group member as the other four female group members were all out due to sickness or work. Two of the counselors were female, but no other group members.    UDS collected: Yes Results: pending  AA/NA attended?: YesThursday  Sponsor?: Yes   [name], LCAS 07/26/2016 10:16 AM
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
## 2016-07-26 NOTE — Progress Notes (Incomplete)
    Daily Group Progress Note  [facility]21022744}   07/26/2016 Beth [name] [id number]  Diagnosis:  Alcohol use disorder, severe, dependence (HCC)  Cannabis use disorder, severe, dependence (HCC)  MDD (major depressive disorder), recurrent, severe, with psychosis (HCC)  Post traumatic stress disorder (PTSD)   Sobriety Date: ***  Group Time: ***  Participation Level: {CHL AMB BH Group Participation:21022742}  Behavioral Response: {CHL AMB BH Group Behavior:21022743}  Type of Therapy: {CHL AMB BH Type of Therapy:21022741}  Interventions: {CHL AMB BH Type of Intervention:21022753}  Topic: ***     Group Time: ***  Participation Level: {CHL AMB BH Group Participation:21022742}  Behavioral Response: {CHL AMB BH Group Behavior:21022743}  Type of Therapy: {CHL AMB BH Type of Therapy:21022741}  Interventions: {CHL AMB BH Type of Intervention:21022753}  Topic: ***   Summary: ***   UDS collected: {BHH YES OR NO:22294} Results: {Findings; urine drug screen:60936}  AA/NA attended?: {BHH YES OR NO:22294}{DAYS OF WUJW:11914}WEEK:22385}  Sponsor?: {BHH YES OR NW:29562}O:22294}   Charmian MuffAnn Faye Strohman, LCAS 07/26/2016 10:17 AM    Daily Group Progress Note  [facility]: [facility]21022744}   07/26/2016 Beth [name] [id number]  Diagnosis:  Alcohol use disorder, severe, dependence (HCC)  Cannabis use disorder, severe, dependence (HCC)  MDD (major depressive disorder), recurrent, severe, with psychosis (HCC)  Post traumatic stress disorder (PTSD)   Sobriety Date: 11/1  Group Time: ***  Participation Level: {CHL AMB BH Group Participation:21022742}  Behavioral Response: {CHL AMB BH Group Behavior:21022743}  Type of Therapy: {CHL AMB BH Type of Therapy:21022741}  Interventions: {CHL AMB BH Type of Intervention:21022753}  Topic: ***     Group Time: ***  Participation Level: {CHL AMB BH Group  Participation:21022742}  Behavioral Response: {CHL AMB BH Group Behavior:21022743}  Type of Therapy: {CHL AMB BH Type of Therapy:21022741}  Interventions: {CHL AMB BH Type of Intervention:21022753}  Topic: ***   Summary: ***   UDS collected: {BHH YES OR NO:22294} Results: {Findings; urine drug screen:60936}  AA/NA attended?: {BHH YES OR NO:22294}{DAYS OF ONGE:95284}WEEK:22385}  Sponsor?: {BHH YES OR XL:24401}O:22294}   Charmian MuffAnn Jaidynn Balster, LCAS 07/26/2016 10:17 AM

## 2016-07-27 ENCOUNTER — Other Ambulatory Visit (HOSPITAL_COMMUNITY): Payer: BLUE CROSS/BLUE SHIELD | Admitting: Psychology

## 2016-07-27 DIAGNOSIS — F1020 Alcohol dependence, uncomplicated: Secondary | ICD-10-CM | POA: Diagnosis not present

## 2016-07-27 DIAGNOSIS — F333 Major depressive disorder, recurrent, severe with psychotic symptoms: Secondary | ICD-10-CM

## 2016-07-27 DIAGNOSIS — F1220 Cannabis dependence, uncomplicated: Secondary | ICD-10-CM

## 2016-07-27 DIAGNOSIS — F4310 Post-traumatic stress disorder, unspecified: Secondary | ICD-10-CM

## 2016-07-31 ENCOUNTER — Other Ambulatory Visit (HOSPITAL_COMMUNITY): Payer: BLUE CROSS/BLUE SHIELD | Admitting: Psychology

## 2016-07-31 DIAGNOSIS — F1020 Alcohol dependence, uncomplicated: Secondary | ICD-10-CM | POA: Diagnosis not present

## 2016-07-31 DIAGNOSIS — F4310 Post-traumatic stress disorder, unspecified: Secondary | ICD-10-CM

## 2016-07-31 DIAGNOSIS — F1220 Cannabis dependence, uncomplicated: Secondary | ICD-10-CM
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
## 2016-08-01 ENCOUNTER — Encounter (HOSPITAL_COMMUNITY): Payer: Self-pay | Admitting: Psychology

## 2016-08-01 NOTE — Progress Notes (Signed)
    Daily Group Progress Note  Program: CD-IOP   08/01/2016 Beth [name] [id number]  Diagnosis:  No diagnosis found.   Sobriety Date: 11/1  Group Time: 1-2:30 pm  Participation Level: Active  Behavioral Response: Appropriate and Sharing  Type of Therapy: Process Group  Interventions: Supportive  Topic: Process: the first half of group was spent in process. Members shared about the past weekend and any challenges or temptations they may have faced. They are also asked to identify what they did to enhance or strengthen their sobriety, including attending 12-step meetings, speaking with others in recovery, working with a sponsor or addressing step work. A new group member was present, and she met with the program director today. He also completed med checks with other group members. Random drug tests were collected today.   Group Time: 2:30-4 pm  Participation Level: Active  Behavioral Response: Sharing  Type of Therapy: Psycho-education Group  Interventions: Strength-based  Topic: Guided Meditation/Psycho-Ed: PAWS; the second half of group began with a 5 minute guided meditation led by one of the counselors. At the conclusion, group members seemed to agree that it had proven very relaxing. A handout was provided to members on the topic of PAWS or post-acute withdrawal syndrome. Members took turns reading the handout and then shared their own experiences with some of these very common symptoms of early recovery.  There was good conversation and feedback among group members.   Summary: The patient reported she had attended a total of 2 AA meetings since we last met. She got a new sponsor whom she can talk to easily and welcomes her phone calls. She was able to get a lot of new phone #'s at the LGBT meeting she attended. She reported she had felt down and spent time in bed on Saturday and Sunday, but attended the [employer] on Saturday night. She decided she would  begin practicing piano because no one played one in the concert. The patient reported that she had attended the birthday party on Sunday evening. She had mentioned this party last week in group and several group members had suggested she just skip it. She had admitted she doubted everyone would be sober. Her report today indicated that there was some drinking and she could smell 'weed' on people so they weren't really sober. She ended up feeling depressed afterwards. It was pointed out how this patient tends to sink into her depression or sadness versus another member who addresses her cognitive distortions and actively does something to change her feelings. In the psycho-ed, the patient does not observe good self-care, especially around sleep hygiene. She stays in bed for hours at a time during the day and struggles with sleep. She admitted a fellow group member had come to pick her up for a meeting on Sunday morning, but she had not set her alarm and did not get up to go with her. The patient remained somewhat reserved today in group and presents with a flattened affect. She provided good feedback to a fellow group member and has remained sober, but she appears depressed and it is somewhat surprising that she has remained substance-free. She reported the guided meditation was relaxing. The patient assisted in the reading of the PAWS handout, but shared little of herself. We will continue to follow closely in the days ahead.    UDS collected: Yes Results: pending  AA/NA attended?: YesThursday and Sunday  Sponsor?: Yes   [name], LCAS 08/01/2016 3:55 PM
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
## 2016-08-02 ENCOUNTER — Other Ambulatory Visit (HOSPITAL_COMMUNITY): Payer: BLUE CROSS/BLUE SHIELD | Admitting: Psychology

## 2016-08-02 DIAGNOSIS — F1020 Alcohol dependence, uncomplicated: Secondary | ICD-10-CM | POA: Diagnosis not present

## 2016-08-02 DIAGNOSIS — F4310 Post-traumatic stress disorder, unspecified: Secondary | ICD-10-CM

## 2016-08-02 DIAGNOSIS — F333 Major depressive disorder, recurrent, severe with psychotic symptoms: Secondary | ICD-10-CM

## 2016-08-03 ENCOUNTER — Other Ambulatory Visit (HOSPITAL_COMMUNITY): Payer: BLUE CROSS/BLUE SHIELD | Admitting: Psychology

## 2016-08-03 DIAGNOSIS — F1020 Alcohol dependence, uncomplicated: Secondary | ICD-10-CM

## 2016-08-03 DIAGNOSIS — F4310 Post-traumatic stress disorder, unspecified: Secondary | ICD-10-CM

## 2016-08-03 DIAGNOSIS — F1220 Cannabis dependence, uncomplicated: Secondary | ICD-10-CM

## 2016-08-03 DIAGNOSIS — F333 Major depressive disorder, recurrent, severe with psychotic symptoms: Secondary | ICD-10-CM

## 2016-08-04 NOTE — Progress Notes (Signed)
    Daily Group Progress Note  Program: CD-IOP   08/04/2016 [place] E Escareno [id number]  Diagnosis:  Alcohol use disorder, severe, dependence ([facility])  Post traumatic stress disorder (PTSD)  MDD (major depressive disorder), recurrent, severe, with psychosis ([facility])   Sobriety Date: 06/14/16  Group Time: 1-2:30  Participation Level: Active  Behavioral Response: Appropriate and Sharing  Type of Therapy: Process Group  Interventions: CBT  Topic: met with patients for 1.5 hour group process session and discussed recovery from mind-altering drugs, cravings, strategies, and resources for sobriety. Pts were active and engaged in discussion. 1 new group member was present and shared briefly and offered feedback to others. One group member became markedly tearful when discussing her recent alcoholism. UDS were collected from some members. Some members met with Charles Kober, program director.     Group Time: 2:30-4  Participation Level: Active  Behavioral Response: Appropriate and Sharing  Type of Therapy: Psycho-education Group  Interventions: Solution Focused  Topic: Met with patients for 1.5 hour group psychoeducation session with an emphasis on AA, 12 step knowledge, and how to pursue early recovery. Patients were all active and engaged in session. Counselor offered strategies for relapse prevention, calling friends, and reaching out for help at meetings.   Summary: Patient was active and engaged in group. She presented as somewhat flat and stated she just came from the hospital where her grandmother is receiving tx for internal bleeding and a recent fall. She stated she attended 2 AA meetings, went to the gym, shopped for others, and applied for jobs at the library. Patient expressed regret for her past relationship with her grandmother since she may die soon and pt's "alcoholic side was all her grandmother saw in last few months." Pt expressed remorse for being drunk in  grandmother's presence and stealing alcohol at her house. Patient also verbalized concern for "taking care of everyone who is freaking out" over grandmother's incident. Patient's mom is reported disheveled and incoherent while expressing worry and hopelessness. Cousnelor stated pt has opportunity to focus on herself and her relationship with her grandmother during this time. Pt agreed. Wes Swan, LPCA   UDS collected: Yes Results: negative  AA/NA attended?: YesWednesday and Thursday  Sponsor?: Yes    , LCAS 08/04/2016 12:10 PM 

## 2016-08-09 ENCOUNTER — Encounter (HOSPITAL_COMMUNITY): Payer: Self-pay | Admitting: Psychology

## 2016-08-09 ENCOUNTER — Other Ambulatory Visit (HOSPITAL_COMMUNITY): Payer: BLUE CROSS/BLUE SHIELD | Admitting: Psychology

## 2016-08-09 DIAGNOSIS — F4310 Post-traumatic stress disorder, unspecified: Secondary | ICD-10-CM

## 2016-08-09 DIAGNOSIS — F1020 Alcohol dependence, uncomplicated: Secondary | ICD-10-CM | POA: Diagnosis not present

## 2016-08-09 DIAGNOSIS — F333 Major depressive disorder, recurrent, severe with psychotic symptoms: Secondary | ICD-10-CM

## 2016-08-09 NOTE — Progress Notes (Signed)
    Daily Group Progress Note  Program: CD-IOP   08/09/2016 Joycelyn RuaRachel E Tillison [id number]  Diagnosis:  No diagnosis found.   Sobriety Date: 11/1  Group Time: 1-2:30 pm  Participation Level: Active  Behavioral Response: Appropriate and Sharing  Type of Therapy: Process Group  Interventions: Supportive  Topic: Process: A former member appeared today and said his 'good-byes' to his fellow group members. This counselor has asked him to come and demonstrate what a healthy farewell looks like. The patient shared about his new job and his continued commitment to sobriety. He urged group members to make the most of their time here in the program. After he left, the members engaged in a 'process' session. They shared about current issues and challenges in early recovery. One member disclosed the differences with her husband who is currently at their home in [place], [place]. she is here with her parents and their 6858-month-old son. While both are in recovery, he seems to be in a considerably different place, in terms of "recovery mentality". The group provided helpful feedback and the importance of her sharing and 'venting' was encouraged and validated. During this half of group, members shared about their plans for the upcoming Christmas holiday.   Group Time: 2:30-4 pm  Participation Level: Active  Behavioral Response: Appropriate  Type of Therapy: Psycho-education Group  Interventions: Solution Focused  Topic: Psycho-Ed: "Most Common Reasons for Relapse". The second half of group was spent in a psycho-ed on the four most common reasons for relapse. A handout was provided, and the group read the handout together and discussed these four common reasons. Members identified the reasons they might be most tempted to use and/or relapse. The session emphasized identifying strategies/plans to address these reasons when they appear. Three group members were absent today.  Summary: The patient  reported she had spent much of yesterday at the hospital. She looked exhausted and her grandmother has been oved out of ICU into a 'regular' hospital room. The patient reported she had not attended any meetings, but had spoken to her sponsor. She had gone with her sister yesterday evening out for dinner and they had had a good conversation. They agreed they needed to be supportive of each other and 'bond' because their patents are so divisive and troubling. The patient described herself as experiencing 'cognitive dissonance' and wondered if she needed to move [place]? This seemed like an excuses and avoidance of addressing her own issues and this was pointed out to her. The patient agreed that perhaps that was true. In the psycho-ed, she identified emotional pain as her biggest trigger and most likely of the four most common reasons for relapse. The patient received some good feedback and support from her fellow group members. She remains flat and emotionally distant, but has remained drug-free for almost 50 days. We will continue to follow closely in the days ahead.   UDS collected: No Results:   AA/NA attended?: No  Sponsor?: No   Charmian Muffnn Douglas Smolinsky, LCAS 08/09/2016 9:43 AM

## 2016-08-10 ENCOUNTER — Other Ambulatory Visit (HOSPITAL_COMMUNITY): Payer: BLUE CROSS/BLUE SHIELD | Admitting: Psychology

## 2016-08-10 DIAGNOSIS — F1020 Alcohol dependence, uncomplicated: Secondary | ICD-10-CM

## 2016-08-10 DIAGNOSIS — F4310 Post-traumatic stress disorder, unspecified: Secondary | ICD-10-CM
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
## 2016-08-11 ENCOUNTER — Encounter (HOSPITAL_COMMUNITY): Payer: Self-pay | Admitting: Psychology

## 2016-08-11 NOTE — Progress Notes (Signed)
    Daily Group Progress Note  Program: CD-IOP   08/11/2016 Beth [name] [id number]  Diagnosis:  No diagnosis found.   Sobriety Date: 11/1  Group Time: 1-2:30 pm  Participation Level: Active  Behavioral Response: Appropriate and Sharing  Type of Therapy: Process Group  Interventions: Supportive  Topic: Process: the first half of group was spent in process. Members shared what they had done to support their sobriety since we met yesterday as well as any challenges or temptations they had faced. A new group member appeared in this session and he introduced himself to his new fellow group members. He received a warm welcome. A drug test was collected from him.   Group Time: 2:30-4 pm  Participation Level: Active  Behavioral Response: Appropriate  Type of Therapy: Psycho-education Group  Interventions: Strength-based  Topic: Psycho-Ed: the second half of group was spent in a psycho-ed. Handouts were provided that included 'Relationship Maps'. The focus was on identifying the people that group members are in relationship with and determining, by 'mapping them', how significant they are in their daily lives. Members were asked to share what they had drawn and explain how they night hope to change them in the future. As the session ended, members shared about their plans over the New Year's weekend. There are some parties at [employer] and there was a lively discussion about attending them. The group will meet next on Wednesday, August 16, 2016.  Summary: The patient reported she had had an interview this morning at a [facility]. It is within walking distance from her home and she was very pleased with how the interview had gone. She was dressed up and displayed a relaxed comfort within herself that we have not seen. The patient admitted she had taken out her nose ring and was glad because none of the staff had any piercings. She had not attended any meetings since our group  session yesterday, but had spent her evening relaxing, getting to bed early and preparing for the interview. The patient received helpful and validating feedback from her fellow group members. During the psycho-ed, the patient hared that her family were small circles and they were not close to her. She reminded the group of how troubling and difficult her family is, and she is really better off away from them. The patient presented as calmer and more at peace with herself than in previous sessions. She is nearing the end of her time here and would like to get back to work. She provided some helpful feedback to her fellow group members and responded well to this intervention.    UDS collected: No Results:   AA/NA attended?: No  Sponsor?: Yes   [name], LCAS 08/11/2016 8:46 AM

## 2016-08-15 ENCOUNTER — Telehealth (HOSPITAL_COMMUNITY): Payer: Self-pay | Admitting: Psychology

## 2016-08-16 ENCOUNTER — Other Ambulatory Visit (HOSPITAL_COMMUNITY): Payer: BLUE CROSS/BLUE SHIELD | Attending: Psychiatry | Admitting: Psychology

## 2016-08-16 DIAGNOSIS — F1220 Cannabis dependence, uncomplicated: Secondary | ICD-10-CM

## 2016-08-16 DIAGNOSIS — F1020 Alcohol dependence, uncomplicated: Secondary | ICD-10-CM | POA: Insufficient documentation

## 2016-08-16 DIAGNOSIS — F4310 Post-traumatic stress disorder, unspecified: Secondary | ICD-10-CM | POA: Insufficient documentation

## 2016-08-16 DIAGNOSIS — T7432XS Child psychological abuse, confirmed, sequela: Secondary | ICD-10-CM

## 2016-08-16 DIAGNOSIS — F333 Major depressive disorder, recurrent, severe with psychotic symptoms: Secondary | ICD-10-CM | POA: Insufficient documentation

## 2016-08-17 ENCOUNTER — Other Ambulatory Visit (HOSPITAL_COMMUNITY): Payer: BLUE CROSS/BLUE SHIELD | Admitting: Psychology

## 2016-08-17 DIAGNOSIS — F419 Anxiety disorder, unspecified: Secondary | ICD-10-CM

## 2016-08-17 DIAGNOSIS — F4310 Post-traumatic stress disorder, unspecified: Secondary | ICD-10-CM

## 2016-08-17 DIAGNOSIS — F1220 Cannabis dependence, uncomplicated: Secondary | ICD-10-CM

## 2016-08-17 DIAGNOSIS — F1020 Alcohol dependence, uncomplicated: Secondary | ICD-10-CM

## 2016-08-20 ENCOUNTER — Encounter (HOSPITAL_COMMUNITY): Payer: Self-pay | Admitting: Psychology
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
## 2016-08-21 ENCOUNTER — Other Ambulatory Visit (HOSPITAL_COMMUNITY): Payer: BLUE CROSS/BLUE SHIELD | Admitting: Psychology

## 2016-08-21 DIAGNOSIS — T7432XS Child psychological abuse, confirmed, sequela: Secondary | ICD-10-CM

## 2016-08-21 DIAGNOSIS — F1020 Alcohol dependence, uncomplicated: Secondary | ICD-10-CM

## 2016-08-21 DIAGNOSIS — F1220 Cannabis dependence, uncomplicated: Secondary | ICD-10-CM
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
## 2016-08-23 ENCOUNTER — Encounter (HOSPITAL_COMMUNITY): Payer: Self-pay | Admitting: Medical

## 2016-08-23 ENCOUNTER — Other Ambulatory Visit (INDEPENDENT_AMBULATORY_CARE_PROVIDER_SITE_OTHER): Payer: BLUE CROSS/BLUE SHIELD | Admitting: Psychology

## 2016-08-23 DIAGNOSIS — F333 Major depressive disorder, recurrent, severe with psychotic symptoms: Secondary | ICD-10-CM | POA: Diagnosis not present

## 2016-08-23 DIAGNOSIS — F4310 Post-traumatic stress disorder, unspecified: Secondary | ICD-10-CM

## 2016-08-23 DIAGNOSIS — F1020 Alcohol dependence, uncomplicated: Secondary | ICD-10-CM

## 2016-08-23 DIAGNOSIS — F419 Anxiety disorder, unspecified: Secondary | ICD-10-CM

## 2016-08-23 DIAGNOSIS — Z6372 Alcoholism and drug addiction in family: Secondary | ICD-10-CM

## 2016-08-23 DIAGNOSIS — Z87898 Personal history of other specified conditions: Secondary | ICD-10-CM

## 2016-08-23 DIAGNOSIS — F1511 Other stimulant abuse, in remission: Secondary | ICD-10-CM

## 2016-08-23 DIAGNOSIS — Z8659 Personal history of other mental and behavioral disorders: Secondary | ICD-10-CM

## 2016-08-23 DIAGNOSIS — H93233 Hyperacusis, bilateral: Secondary | ICD-10-CM

## 2016-08-23 DIAGNOSIS — F1220 Cannabis dependence, uncomplicated: Secondary | ICD-10-CM

## 2016-08-23 MED ORDER — HYDROXYZINE HCL 25 MG PO TABS: 25.0000 mg | ORAL_TABLET | Freq: Four times a day (QID) | ORAL | 2 refills | Status: DC | PRN

## 2016-08-23 MED ORDER — BUPROPION HCL ER (XL) 300 MG PO TB24: 300.0000 mg | ORAL_TABLET | ORAL | 2 refills | Status: DC

## 2016-08-23 MED ORDER — QUETIAPINE FUMARATE 300 MG PO TABS: ORAL_TABLET | ORAL | 2 refills | Status: DC
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
## 2016-08-23 NOTE — Addendum Note (Signed)
Addended by: Court JoyKOBER, CHARLES E on: 08/23/2016 04:48 PM   Modules accepted: Orders

## 2016-08-24 ENCOUNTER — Other Ambulatory Visit (HOSPITAL_COMMUNITY): Payer: BLUE CROSS/BLUE SHIELD | Admitting: Psychology

## 2016-08-24 ENCOUNTER — Encounter (HOSPITAL_COMMUNITY): Payer: Self-pay | Admitting: Psychology

## 2016-08-24 DIAGNOSIS — T7432XS Child psychological abuse, confirmed, sequela: Secondary | ICD-10-CM

## 2016-08-24 DIAGNOSIS — F4310 Post-traumatic stress disorder, unspecified: Secondary | ICD-10-CM

## 2016-08-24 DIAGNOSIS — F1020 Alcohol dependence, uncomplicated: Secondary | ICD-10-CM

## 2016-08-24 DIAGNOSIS — F1220 Cannabis dependence, uncomplicated: Secondary | ICD-10-CM

## 2016-08-25 NOTE — Progress Notes (Signed)
[name] is a 26 y.o. female patient. CD-IOP: Successful Discharge. The patient successfully completed the CD-IOP program today. She graduated with the same sobriety date she had upon arrival for her first group session, 06/14/16. She did everything we asked of her and [name] leaves the program with all of the tools she needs in order to remain drug-free going forward.       Charmian MuffAnn Deyon Chizek, LCAS

## 2016-08-25 NOTE — Progress Notes (Signed)
    Daily Group Progress Note  Program: CD-IOP   08/25/2016 Beth [name] [id number]  Diagnosis:  Alcohol use disorder, severe, dependence ([place])  Post traumatic stress disorder (PTSD)  Chronic anxiety  MDD (major depressive disorder), recurrent, severe, with psychosis ([place]) - Plan: Lithium level, Thyroid Panel With TSH  Hx of eating disorder  Hyperacusis, bilateral  Dysfunctional family due to alcoholism  Cannabis use disorder, severe, dependence ([place])  Hx of amphetamine abuse   Sobriety Date: 06/14/17  Group Time: 1-2:30pm  Participation Level: Active  Behavioral Response: Appropriate and Sharing  Type of Therapy: Process Group  Interventions: Strength-based and Supportive  Topic: Counselors met with patients for 1.5 hour group process session. Topics included recovery from mind altering drugs and alcohol, sobriety, medication management, acl's, and 12 step facilitation. One new patient was present and appeared attentive and met with Investment banker, operational. One pt was missing due to unexcused absence.     Group Time: 2:30-4pm  Participation Level: Active  Behavioral Response: Appropriate and Sharing  Type of Therapy: Psycho-education Group  Interventions: Solution Focused, Strength-based, Supportive and Other: Gestalt Theory and chaplain  Topic: Counselors and guest chaplain met with patients for 1.5 hour group psychoeducation session. Topics included Gestalt theory, immediate processing, and recovery from mind altering drugs and alcohol. Chaplain led 10 minute "emotion check-in" and had each group member participate in a reading/ experiential activity. Patients were very attentive and engaged for session.    Summary: Patient was active and engaged during group session. Her mood was euthymic. She stated she attended 1 AA meeting and was "getting back into a habit of attending meetings regularly" after having lots of work conflicts. Patient stated she got a new  phone. She decided that she would not enter a relationship with a potential romantic interest and instead is choosing to focus on her recovery right now. Patient reported she is working on sustaining her goals of living mindfully which is "really helpful". Patient met with Program Director (see attached note from Deedra Ehrich, [place]) for discharge. Patient admitted she is a "people pleaser" and that she is working on these behaviors. Patient was excited when she stated that CD-IOP has been "very helpful and has prepared her for life in general". Patient plans to graduate successfully tomorrow. Patient was supportive of other group members and stated having gratitude for and pride in other members' actions. Beth [name], LPCA   UDS collected: No Results: negative  AA/NA attended?: YesWednesday  Sponsor?: Yes   Beth [name], LCAS 08/25/2016 10:48 AM

## 2016-08-26 ENCOUNTER — Encounter (HOSPITAL_COMMUNITY): Payer: Self-pay | Admitting: Psychology
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
## 2016-08-28 ENCOUNTER — Encounter (HOSPITAL_COMMUNITY): Payer: Self-pay | Admitting: Psychology

## 2016-08-28 ENCOUNTER — Other Ambulatory Visit (HOSPITAL_COMMUNITY): Payer: BLUE CROSS/BLUE SHIELD

## 2016-08-28 NOTE — Progress Notes (Signed)
    Daily Group Progress Note  Program: CD-IOP   08/28/2016 KALIOPI BLYDEN [id number]  Diagnosis:  No diagnosis found.   Sobriety Date: 06/14/16  Group Time: 1-2:30pm  Participation Level: Active  Behavioral Response: Appropriate and Sharing  Type of Therapy: Process Group  Interventions: Supportive  Topic: Process: the first half of group was spent in process. Members shared about the things they had done over the weekend to support their sobriety. Any challenges or temptations were also discussed. A new member was present, and she introduced herself during the session.   Group Time: 2:30-4pm  Participation Level: Active  Behavioral Response: Sharing  Type of Therapy: Psycho-education Group  Interventions: Strength-based  Topic: Psycho-Ed: the second half of group was spent in a psycho-ed. A handout was provided that asked members to identify three circles. The inner circle was what would include using behaviors, the middle circle identified things that could lead to using and the largest, outer circle was to be filled with things that would promote sobriety. Members took some time to identify people, places, things and emotions, in each of the three circles and then discussed them. During this half of group, the program director met with one of the newer group members.   Summary: The patient responded to her fellow member who disclosed his recent relapse and expressed concern about him. She was worried he would die and didn't seem to recognize the danger he was putting himself in. He appeared indifferent as she shared her feelings about him. The patient reported she had worked four consecutive days, from Wednesday until Saturday and admitted she was exhausted. She had only attended 2 AA meetings since we last met due to her demanding work schedule. The patient reported she had met with her ex-boyfriend yesterday. She admitted that they had had sex. When questioned about this,  she made clear that she couldn't get into a relationship for a while, but admitted he said she was like a different person. The patient reported, "I knew I shouldn't have slept with him", but like other times, she went ahead and did things she knew she shouldn't. when another member asked about her working interview, the patient confirmed that she had one scheduled for tomorrow. She admitted she wasn't sure whether she should go because her first interview at that office included one person making their racist and bigoted views very clear. The patient wasn't sure she would be able to work there and tolerate that prejudice. During the psycho-ed, the patient identified AA meetings as a staple of her outer circle. Her middle circle included flashbacks from previous trauma that could lead her back to using. The patient responded well to this intervention and provided some helpful feedback to her fellow group members. She was reminded of the importance of balance in her daily life, especially around her work commitments. The patient has remained drug-free since entering the program and will be graduating later this week.    UDS collected: No Results:   AA/NA attended?: YesSaturday and Sunday  Sponsor?: Yes   [name], LCAS 08/28/2016 4:46 PM

## 2016-08-30 ENCOUNTER — Other Ambulatory Visit (HOSPITAL_COMMUNITY): Payer: BLUE CROSS/BLUE SHIELD

## 2016-08-31 ENCOUNTER — Other Ambulatory Visit (HOSPITAL_COMMUNITY): Payer: BLUE CROSS/BLUE SHIELD

## 2016-09-04 ENCOUNTER — Other Ambulatory Visit (HOSPITAL_COMMUNITY): Payer: BLUE CROSS/BLUE SHIELD

## 2016-09-06 ENCOUNTER — Other Ambulatory Visit (HOSPITAL_COMMUNITY): Payer: BLUE CROSS/BLUE SHIELD

## 2016-09-07 ENCOUNTER — Other Ambulatory Visit (HOSPITAL_COMMUNITY): Payer: BLUE CROSS/BLUE SHIELD

## 2016-09-11 ENCOUNTER — Other Ambulatory Visit (HOSPITAL_COMMUNITY): Payer: Self-pay | Admitting: Medical

## 2016-09-11 ENCOUNTER — Other Ambulatory Visit (HOSPITAL_COMMUNITY): Payer: BLUE CROSS/BLUE SHIELD

## 2016-09-11 MED ORDER — TRAZODONE HCL 100 MG PO TABS: ORAL_TABLET | ORAL | 1 refills | Status: DC

## 2016-09-11 NOTE — Progress Notes (Signed)
Pt called Beth MuffAnn Evans requesting Trazodone-Rx called for 100 mg hs-pt reported later taking 200 mg sent today electronically

## 2016-09-13 ENCOUNTER — Ambulatory Visit (INDEPENDENT_AMBULATORY_CARE_PROVIDER_SITE_OTHER): Payer: BLUE CROSS/BLUE SHIELD | Admitting: Licensed Clinical Social Worker

## 2016-09-13 ENCOUNTER — Other Ambulatory Visit (HOSPITAL_COMMUNITY): Payer: BLUE CROSS/BLUE SHIELD

## 2016-09-13 DIAGNOSIS — F1021 Alcohol dependence, in remission: Secondary | ICD-10-CM

## 2016-09-13 DIAGNOSIS — F333 Major depressive disorder, recurrent, severe with psychotic symptoms: Secondary | ICD-10-CM | POA: Diagnosis not present

## 2016-09-13 DIAGNOSIS — F4310 Post-traumatic stress disorder, unspecified: Secondary | ICD-10-CM

## 2016-09-13 NOTE — Progress Notes (Signed)
   THERAPIST PROGRESS NOTE  Session Time: 5:30-5:40pm  Participation Level: Active  Behavioral Response: Fairly GroomedAlertEuthymic  Type of Therapy: Aftercare  Treatment Goals addressed: Coping  Interventions: CBT and Solution Focused  Summary: Joycelyn RuaRachel E Nesbitt is a 26 y.o. female who presents with alcohol use disorder severe in early remission. Patient stated she had a slip by using marijuana over the weekend after spending time with a friend in recovery who also smokes marijuana. Patient acknowledged this was dangerous, it was "not enjoyable", but that she did enjoy laughing for "5 hours straight". Counselor discussed ways to laugh and reward herself w/o using. Patient agreed that sobriety was her first priority and she plans to attend more meetings. She stated she was happy to have Aftercare services and felt that the group was helpful. Her sobriety date is 1/28. Patient reported she spoke to her sponsor about the relapse.    Suicidal/Homicidal: Nowithout intent/plan  Therapist Response: Counselor used validation and motivational interviewing to work on patient ambivalence towards changing and completely committing to sobriety. Patient appeared excited and happy to be back in therapy and stated she "miss the support of CD-IOP". Patient continues to display codependant characteristics by supporting a roommate who does not honor pt desire to keep a sober home.  Plan: Return again in 1 weeks.  Diagnosis: F10.20    Margo CommonWesley E Abeer Iversen, LPCA 09/13/2016

## 2016-09-18 ENCOUNTER — Ambulatory Visit (INDEPENDENT_AMBULATORY_CARE_PROVIDER_SITE_OTHER): Payer: BLUE CROSS/BLUE SHIELD | Admitting: Licensed Clinical Social Worker

## 2016-09-18 DIAGNOSIS — F1021 Alcohol dependence, in remission: Secondary | ICD-10-CM

## 2016-09-18 DIAGNOSIS — F333 Major depressive disorder, recurrent, severe with psychotic symptoms: Secondary | ICD-10-CM

## 2016-09-18 DIAGNOSIS — F1210 Cannabis abuse, uncomplicated: Secondary | ICD-10-CM | POA: Diagnosis not present

## 2016-09-18 DIAGNOSIS — F4310 Post-traumatic stress disorder, unspecified: Secondary | ICD-10-CM

## 2016-09-19 ENCOUNTER — Encounter (HOSPITAL_COMMUNITY): Payer: Self-pay | Admitting: Licensed Clinical Social Worker

## 2016-09-19 NOTE — Progress Notes (Signed)
   THERAPIST PROGRESS NOTE  Session Time: 5:30-6:30  Participation Level: Active  Behavioral Response: CasualAlertAnxious, Depressed and Irritable  Type of Therapy: Group Therapy  Treatment Goals addressed: Anger, Anxiety and Coping  Interventions: DBT and Motivational Interviewing  Summary: Beth [name] is a 26 y.o. female who presents with recent cannabis abuse and a sustained early remission of alcohol use disorder. Patient presents as anxious, stating "i really feel my sobriety threatened recently". Pt stated her roommate continues to drink and use cannabis heavily leaving evidence all around the house, which is triggering. Pt states she feels compelled to help others before she is stable and helping herself. She offered helpful feedback to new group members and was welcoming and articulate.    Suicidal/Homicidal: Nowithout intent/plan  Therapist Response: Patient was especially loquacious and agitated stated she had just met with her sponsor and realized "a lot of the problems in her life stem from helping others more than herself". Her motivation for sobriety seems ambivalent when she states that she feels hopeless and "might as well use" while also recognizing she feels "great being sober". Patient stated she intends to continue in Aftercare for added support to sustain the changes she has recently made.   Plan: Return again in 1 weeks.      Archie Balboa, LPCA 09/19/2016

## 2016-09-20 ENCOUNTER — Ambulatory Visit (HOSPITAL_COMMUNITY): Payer: Self-pay | Admitting: Licensed Clinical Social Worker

## 2016-09-25 ENCOUNTER — Ambulatory Visit (HOSPITAL_COMMUNITY): Payer: Self-pay | Admitting: Licensed Clinical Social Worker

## 2016-09-27 ENCOUNTER — Ambulatory Visit (HOSPITAL_COMMUNITY): Payer: Self-pay | Admitting: Licensed Clinical Social Worker

## 2016-10-02 ENCOUNTER — Ambulatory Visit (HOSPITAL_COMMUNITY): Payer: Self-pay | Admitting: Licensed Clinical Social Worker

## 2016-10-04 ENCOUNTER — Ambulatory Visit (HOSPITAL_COMMUNITY): Payer: Self-pay | Admitting: Licensed Clinical Social Worker

## 2016-10-06 ENCOUNTER — Other Ambulatory Visit (HOSPITAL_COMMUNITY): Payer: Self-pay | Admitting: Medical

## 2016-10-09 ENCOUNTER — Ambulatory Visit (HOSPITAL_COMMUNITY): Payer: Self-pay | Admitting: Psychiatry

## 2016-10-09 ENCOUNTER — Ambulatory Visit (HOSPITAL_COMMUNITY): Payer: Self-pay | Admitting: Licensed Clinical Social Worker

## 2016-10-10 ENCOUNTER — Other Ambulatory Visit (HOSPITAL_COMMUNITY): Payer: Self-pay | Admitting: Medical

## 2016-10-11 ENCOUNTER — Ambulatory Visit (HOSPITAL_COMMUNITY): Payer: Self-pay | Admitting: Licensed Clinical Social Worker

## 2016-10-11 LAB — LITHIUM LEVEL: Lithium Lvl: 0.6 mmol/L (ref 0.6–1.2)

## 2016-10-12 ENCOUNTER — Ambulatory Visit (INDEPENDENT_AMBULATORY_CARE_PROVIDER_SITE_OTHER): Payer: BLUE CROSS/BLUE SHIELD | Admitting: Psychiatry

## 2016-10-12 ENCOUNTER — Encounter (HOSPITAL_COMMUNITY): Payer: Self-pay | Admitting: Psychiatry

## 2016-10-12 VITALS — BP 112/78 | HR 99 | Ht 61.5 in | Wt 135.6 lb

## 2016-10-12 DIAGNOSIS — F4310 Post-traumatic stress disorder, unspecified: Secondary | ICD-10-CM

## 2016-10-12 DIAGNOSIS — F17210 Nicotine dependence, cigarettes, uncomplicated: Secondary | ICD-10-CM

## 2016-10-12 DIAGNOSIS — Z818 Family history of other mental and behavioral disorders: Secondary | ICD-10-CM

## 2016-10-12 DIAGNOSIS — Z79899 Other long term (current) drug therapy: Secondary | ICD-10-CM

## 2016-10-12 DIAGNOSIS — F1021 Alcohol dependence, in remission: Secondary | ICD-10-CM

## 2016-10-12 DIAGNOSIS — Z813 Family history of other psychoactive substance abuse and dependence: Secondary | ICD-10-CM

## 2016-10-12 DIAGNOSIS — F333 Major depressive disorder, recurrent, severe with psychotic symptoms: Secondary | ICD-10-CM

## 2016-10-12 DIAGNOSIS — Z811 Family history of alcohol abuse and dependence: Secondary | ICD-10-CM

## 2016-10-12 DIAGNOSIS — F1210 Cannabis abuse, uncomplicated: Secondary | ICD-10-CM

## 2016-10-12 DIAGNOSIS — R45851 Suicidal ideations: Secondary | ICD-10-CM

## 2016-10-12 MED ORDER — TRAZODONE HCL 100 MG PO TABS: 200.0000 mg | ORAL_TABLET | Freq: Every day | ORAL | 1 refills | Status: DC

## 2016-10-12 MED ORDER — LITHIUM CARBONATE 150 MG PO CAPS: 450.0000 mg | ORAL_CAPSULE | Freq: Every day | ORAL | 2 refills | Status: DC

## 2016-10-12 MED ORDER — HYDROXYZINE HCL 50 MG PO TABS: 50.0000 mg | ORAL_TABLET | Freq: Four times a day (QID) | ORAL | 1 refills | Status: DC | PRN

## 2016-10-12 MED ORDER — BUPROPION HCL ER (XL) 300 MG PO TB24: 300.0000 mg | ORAL_TABLET | ORAL | 2 refills | Status: DC
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
## 2016-10-16 ENCOUNTER — Ambulatory Visit (HOSPITAL_COMMUNITY): Payer: Self-pay | Admitting: Licensed Clinical Social Worker

## 2016-10-18 ENCOUNTER — Ambulatory Visit (HOSPITAL_COMMUNITY): Payer: Self-pay | Admitting: Licensed Clinical Social Worker

## 2016-10-23 ENCOUNTER — Ambulatory Visit (HOSPITAL_COMMUNITY): Payer: Self-pay | Admitting: Licensed Clinical Social Worker

## 2016-10-25 ENCOUNTER — Ambulatory Visit (HOSPITAL_COMMUNITY): Payer: Self-pay | Admitting: Licensed Clinical Social Worker

## 2016-10-30 ENCOUNTER — Ambulatory Visit (HOSPITAL_COMMUNITY): Payer: Self-pay | Admitting: Licensed Clinical Social Worker

## 2016-11-01 ENCOUNTER — Ambulatory Visit (HOSPITAL_COMMUNITY): Payer: Self-pay | Admitting: Licensed Clinical Social Worker

## 2016-11-06 ENCOUNTER — Ambulatory Visit (HOSPITAL_COMMUNITY): Payer: Self-pay | Admitting: Licensed Clinical Social Worker

## 2016-11-08 ENCOUNTER — Ambulatory Visit (HOSPITAL_COMMUNITY): Payer: Self-pay | Admitting: Licensed Clinical Social Worker

## 2016-11-16 ENCOUNTER — Encounter (HOSPITAL_COMMUNITY): Payer: Self-pay | Admitting: Psychiatry

## 2016-11-16 ENCOUNTER — Ambulatory Visit (INDEPENDENT_AMBULATORY_CARE_PROVIDER_SITE_OTHER): Payer: BLUE CROSS/BLUE SHIELD | Admitting: Psychiatry

## 2016-11-16 VITALS — BP 110/72 | HR 83 | Ht 61.0 in | Wt 138.6 lb

## 2016-11-16 DIAGNOSIS — F4310 Post-traumatic stress disorder, unspecified: Secondary | ICD-10-CM

## 2016-11-16 DIAGNOSIS — F1210 Cannabis abuse, uncomplicated: Secondary | ICD-10-CM

## 2016-11-16 DIAGNOSIS — F1021 Alcohol dependence, in remission: Secondary | ICD-10-CM

## 2016-11-16 DIAGNOSIS — Z8659 Personal history of other mental and behavioral disorders: Secondary | ICD-10-CM

## 2016-11-16 DIAGNOSIS — Z813 Family history of other psychoactive substance abuse and dependence: Secondary | ICD-10-CM

## 2016-11-16 DIAGNOSIS — Z79899 Other long term (current) drug therapy: Secondary | ICD-10-CM

## 2016-11-16 DIAGNOSIS — F333 Major depressive disorder, recurrent, severe with psychotic symptoms: Secondary | ICD-10-CM

## 2016-11-16 DIAGNOSIS — Z818 Family history of other mental and behavioral disorders: Secondary | ICD-10-CM

## 2016-11-16 DIAGNOSIS — F17210 Nicotine dependence, cigarettes, uncomplicated: Secondary | ICD-10-CM

## 2016-11-16 DIAGNOSIS — Z810 Family history of intellectual disabilities: Secondary | ICD-10-CM

## 2016-11-16 DIAGNOSIS — Z811 Family history of alcohol abuse and dependence: Secondary | ICD-10-CM

## 2016-11-16 MED ORDER — HYDROXYZINE HCL 50 MG PO TABS: 50.0000 mg | ORAL_TABLET | Freq: Four times a day (QID) | ORAL | 1 refills | Status: DC | PRN

## 2016-11-16 MED ORDER — LITHIUM CARBONATE 150 MG PO CAPS: 450.0000 mg | ORAL_CAPSULE | Freq: Every day | ORAL | 3 refills | Status: DC

## 2016-11-16 MED ORDER — TRAZODONE HCL 100 MG PO TABS: 200.0000 mg | ORAL_TABLET | Freq: Every day | ORAL | 1 refills | Status: DC

## 2016-11-16 MED ORDER — BUPROPION HCL ER (XL) 300 MG PO TB24: 300.0000 mg | ORAL_TABLET | ORAL | 1 refills | Status: DC
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
## 2016-12-02 ENCOUNTER — Encounter: Payer: Self-pay | Admitting: Family

## 2016-12-13 ENCOUNTER — Other Ambulatory Visit (INDEPENDENT_AMBULATORY_CARE_PROVIDER_SITE_OTHER): Payer: Self-pay

## 2016-12-13 ENCOUNTER — Other Ambulatory Visit: Payer: Self-pay

## 2016-12-13 DIAGNOSIS — Z5181 Encounter for therapeutic drug level monitoring: Secondary | ICD-10-CM

## 2016-12-13 LAB — TSH: TSH: 2.92 u[IU]/mL (ref 0.35–4.50)

## 2016-12-13 LAB — T4, FREE: Free T4: 0.71 ng/dL (ref 0.60–1.60)

## 2016-12-14 ENCOUNTER — Encounter: Payer: Self-pay | Admitting: Family

## 2016-12-14 LAB — T3: T3 TOTAL: 126 ng/dL (ref 76–181)

## 2017-01-11 ENCOUNTER — Encounter (HOSPITAL_COMMUNITY): Payer: Self-pay | Admitting: Psychiatry

## 2017-06-21 ENCOUNTER — Telehealth (HOSPITAL_COMMUNITY): Payer: Self-pay

## 2017-06-21 NOTE — Telephone Encounter (Signed)
Patient scheduled appointment for the end of the month, she was transferred to me for medication refills. Patient told me she is no longer taking any medication and did not want refills. Just wanted you to be aware.

## 2017-07-30 ENCOUNTER — Telehealth (HOSPITAL_COMMUNITY): Payer: Self-pay

## 2017-07-30 NOTE — Telephone Encounter (Signed)
Medication refill request - [facility] requesting a refill of her prescribed Bupropion XL 300 mg tablets, last ordered 11/16/16 for 90 + 1 refill and pt returns 09/13/17

## 2017-07-30 NOTE — Telephone Encounter (Signed)
Medication management - Called [facility] and left a message for pharmacist that patient would have to be seen before a new Bupropion prescription would be ordered as it has been 8 months since her last appointment.  Requested they call back if any question.

## 2017-07-30 NOTE — Telephone Encounter (Signed)
They havent been seen in 8 months. They need to be seen in office - she has a pretty complex regimen and substance abuse, so restarting wellbutrin could be bad if she is using.

## 2017-07-31 ENCOUNTER — Encounter (HOSPITAL_COMMUNITY): Payer: Self-pay

## 2017-08-13 DIAGNOSIS — B977 Papillomavirus as the cause of diseases classified elsewhere: Secondary | ICD-10-CM

## 2017-08-13 HISTORY — DX: Papillomavirus as the cause of diseases classified elsewhere: B97.7

## 2017-08-22 ENCOUNTER — Encounter: Payer: Self-pay | Admitting: Family

## 2017-08-23 ENCOUNTER — Encounter (HOSPITAL_COMMUNITY): Payer: Self-pay | Admitting: Psychiatry

## 2017-08-23 DIAGNOSIS — F333 Major depressive disorder, recurrent, severe with psychotic symptoms: Secondary | ICD-10-CM

## 2017-08-23 DIAGNOSIS — F4310 Post-traumatic stress disorder, unspecified: Secondary | ICD-10-CM

## 2017-08-24 MED ORDER — BUPROPION HCL ER (XL) 300 MG PO TB24: 300.0000 mg | ORAL_TABLET | ORAL | 0 refills | Status: DC

## 2017-09-11 ENCOUNTER — Ambulatory Visit (HOSPITAL_COMMUNITY)
Admission: RE | Admit: 2017-09-11 | Discharge: 2017-09-11 | Disposition: A | Discharge status: Home / Self Care | Payer: No Typology Code available for payment source | Source: Ambulatory Visit | Attending: Obstetrics and Gynecology | Admitting: Obstetrics and Gynecology

## 2017-09-11 ENCOUNTER — Encounter (HOSPITAL_COMMUNITY): Payer: Self-pay

## 2017-09-11 VITALS — BP 102/64 | Ht 61.0 in | Wt 126.0 lb

## 2017-09-11 DIAGNOSIS — Z1239 Encounter for other screening for malignant neoplasm of breast: Secondary | ICD-10-CM

## 2017-09-11 DIAGNOSIS — R87612 Low grade squamous intraepithelial lesion on cytologic smear of cervix (LGSIL): Secondary | ICD-10-CM

## 2017-09-11 NOTE — Progress Notes (Signed)
[facility] due to having an abnormal Pap smear on 07/31/2017 that a colposcopy is recommended for follow-up. Patient complained of diffuse bilateral breast tenderness that happens at times.   Pap Smear: Pap smear not completed today. Last [facility] and [facility] suspiciou for high grade. Referred patient to the [facility] for a colpscopy. Appointment scheduled for Wednesday, September 26, 2017 at 1455. Per patient has no history of an abnormal Pap smear prior to her most recent Pap smear. Last Pap smear result is in Epic.  Physical exam: Breasts Breasts symmetrical. No skin abnormalities bilateral breasts. No nipple retraction bilateral breasts. No nipple discharge bilateral breasts. No lymphadenopathy. No lumps palpated bilateral breasts. No complaints of pain or tenderness on exam. Screening mammogram recommended at age 27 unless clinically indicated.  Pelvic/Bimanual No Pap smear completed today since last Pap smear was 07/31/2017. Pap smear not indicated per BCCCP guidelines.   Smoking History: Patient is a current smoker. Discussed smoking cessation with patient. Referred to the [facility] and gave resources to free smoking cessation classes at [facility].  Patient Navigation: Patient education provided. Access to services provided for patient through [facility].

## 2017-09-11 NOTE — Addendum Note (Signed)
Encounter addended by: Priscille HeidelbergBrannock, Jermal Dismuke P, RN on: 09/11/2017 3:23 PM  Actions taken: Sign clinical note

## 2017-09-11 NOTE — Patient Instructions (Addendum)
Explained breast self awareness with Beth [name]. Patient did not need a Pap smear today due to last Pap smear was 07/31/2017. Explained the colposcopy the recommended follow-up for her abnormal Pap smear. [facility] for [facility]. Appointment scheduled for Wednesday, September 26, 2017 at 1455. Patient aware of appointment and will be there. Discussed smoking cessation with patient. Referred to the [facility] and gave resources to free smoking cessation classes at [facility]. Let patient know she will need a screening mammogram at age 27 unless clinically indicated prior. Informed patient that if her breast pain starts occurring more frequently or in one area to call BCCCP to schedule an appointment. Beth Ruaachel E Harbach verbalized understanding.  Arianne Klinge, Kathaleen Maserhristine Poll, RN 3:07 PM

## 2017-09-12 ENCOUNTER — Encounter (HOSPITAL_COMMUNITY): Payer: Self-pay | Admitting: *Deleted

## 2017-09-13 ENCOUNTER — Ambulatory Visit (INDEPENDENT_AMBULATORY_CARE_PROVIDER_SITE_OTHER): Payer: No Typology Code available for payment source | Admitting: Psychiatry

## 2017-09-13 ENCOUNTER — Encounter (HOSPITAL_COMMUNITY): Payer: Self-pay | Admitting: Psychiatry

## 2017-09-13 VITALS — BP 96/62 | HR 100 | Ht 62.0 in | Wt 124.0 lb

## 2017-09-13 DIAGNOSIS — Z813 Family history of other psychoactive substance abuse and dependence: Secondary | ICD-10-CM

## 2017-09-13 DIAGNOSIS — F4310 Post-traumatic stress disorder, unspecified: Secondary | ICD-10-CM | POA: Diagnosis not present

## 2017-09-13 DIAGNOSIS — F17210 Nicotine dependence, cigarettes, uncomplicated: Secondary | ICD-10-CM

## 2017-09-13 DIAGNOSIS — Z811 Family history of alcohol abuse and dependence: Secondary | ICD-10-CM | POA: Diagnosis not present

## 2017-09-13 DIAGNOSIS — F333 Major depressive disorder, recurrent, severe with psychotic symptoms: Secondary | ICD-10-CM

## 2017-09-13 DIAGNOSIS — F1021 Alcohol dependence, in remission: Secondary | ICD-10-CM | POA: Diagnosis not present

## 2017-09-13 DIAGNOSIS — F502 Bulimia nervosa: Secondary | ICD-10-CM

## 2017-09-13 DIAGNOSIS — Z79899 Other long term (current) drug therapy: Secondary | ICD-10-CM

## 2017-09-13 DIAGNOSIS — F341 Dysthymic disorder: Secondary | ICD-10-CM | POA: Diagnosis not present

## 2017-09-13 DIAGNOSIS — Z818 Family history of other mental and behavioral disorders: Secondary | ICD-10-CM

## 2017-09-13 DIAGNOSIS — F603 Borderline personality disorder: Secondary | ICD-10-CM

## 2017-09-13 MED ORDER — CLOMIPRAMINE HCL 50 MG PO CAPS: ORAL_CAPSULE | ORAL | 2 refills | Status: DC

## 2017-09-13 MED ORDER — LITHIUM CARBONATE ER 450 MG PO TBCR: 450.0000 mg | EXTENDED_RELEASE_TABLET | Freq: Every day | ORAL | 1 refills | Status: DC

## 2017-09-13 MED ORDER — TRAZODONE HCL 100 MG PO TABS: 100.0000 mg | ORAL_TABLET | Freq: Every evening | ORAL | 1 refills | Status: DC | PRN

## 2017-09-13 MED ORDER — HYDROXYZINE HCL 50 MG PO TABS: 50.0000 mg | ORAL_TABLET | Freq: Four times a day (QID) | ORAL | 1 refills | Status: DC | PRN
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
## 2017-09-17 ENCOUNTER — Encounter (HOSPITAL_COMMUNITY): Payer: Self-pay | Admitting: Psychiatry

## 2017-09-18 ENCOUNTER — Other Ambulatory Visit (HOSPITAL_COMMUNITY): Payer: Self-pay | Admitting: Psychiatry

## 2017-09-18 MED ORDER — NORTRIPTYLINE HCL 25 MG PO CAPS: ORAL_CAPSULE | ORAL | 0 refills | Status: DC

## 2017-09-26 ENCOUNTER — Ambulatory Visit (INDEPENDENT_AMBULATORY_CARE_PROVIDER_SITE_OTHER): Payer: No Typology Code available for payment source | Admitting: Family Medicine

## 2017-09-26 ENCOUNTER — Ambulatory Visit: Payer: No Typology Code available for payment source | Admitting: Clinical

## 2017-09-26 ENCOUNTER — Encounter: Payer: Self-pay | Admitting: Family Medicine

## 2017-09-26 ENCOUNTER — Other Ambulatory Visit (HOSPITAL_COMMUNITY)
Admission: RE | Admit: 2017-09-26 | Discharge: 2017-09-26 | Disposition: A | Discharge status: Home / Self Care | Payer: Self-pay | Source: Ambulatory Visit | Attending: Family Medicine | Admitting: Family Medicine

## 2017-09-26 VITALS — BP 113/72 | HR 109 | Wt 125.1 lb

## 2017-09-26 DIAGNOSIS — F419 Anxiety disorder, unspecified: Secondary | ICD-10-CM

## 2017-09-26 DIAGNOSIS — F32A Depression, unspecified: Secondary | ICD-10-CM

## 2017-09-26 DIAGNOSIS — Z3202 Encounter for pregnancy test, result negative: Secondary | ICD-10-CM | POA: Diagnosis not present

## 2017-09-26 DIAGNOSIS — N870 Mild cervical dysplasia: Secondary | ICD-10-CM

## 2017-09-26 DIAGNOSIS — R87612 Low grade squamous intraepithelial lesion on cytologic smear of cervix (LGSIL): Secondary | ICD-10-CM | POA: Insufficient documentation

## 2017-09-26 DIAGNOSIS — F329 Major depressive disorder, single episode, unspecified: Secondary | ICD-10-CM

## 2017-09-26 DIAGNOSIS — F333 Major depressive disorder, recurrent, severe with psychotic symptoms: Secondary | ICD-10-CM

## 2017-09-26 LAB — POCT PREGNANCY, URINE: Preg Test, Ur: NEGATIVE

## 2017-09-26 NOTE — Patient Instructions (Signed)
Colposcopy, Care After  This sheet gives you information about how to care for yourself after your procedure. Your doctor may also give you more specific instructions. If you have problems or questions, contact your doctor.  What can I expect after the procedure?  If you did not have a tissue sample removed (did not have a biopsy), you may only have some spotting for a few days. You can go back to your normal activities.  If you had a tissue sample removed, it is common to have:  · Soreness and pain. This may last for a few days.  · Light-headedness.  · Mild bleeding from your vagina or dark-colored, grainy discharge from your vagina. This may last for a few days. You may need to wear a sanitary pad.  · Spotting for at least 48 hours after the procedure.    Follow these instructions at home:  · Take over-the-counter and prescription medicines only as told by your doctor. Ask your doctor what medicines you can start taking again. This is very important if you take blood-thinning medicine.  · Do not drive or use heavy machinery while taking prescription pain medicine.  · For 3 days, or as long as your doctor tells you, avoid:  ? Douching.  ? Using tampons.  ? Having sex.  · If you use birth control (contraception), keep using it.  · Limit activity for the first day after the procedure. Ask your doctor what activities are safe for you.  · It is up to you to get the results of your procedure. Ask your doctor when your results will be ready.  · Keep all follow-up visits as told by your doctor. This is important.  Contact a doctor if:  · You get a skin rash.  Get help right away if:  · You are bleeding a lot from your vagina. It is a lot of bleeding if you are using more than one pad an hour for 2 hours in a row.  · You have clumps of blood (blood clots) coming from your vagina.  · You have a fever.  · You have chills  · You have pain in your lower belly (pelvic area).  · You have signs of infection, such as vaginal  discharge that is:  ? Different than usual.  ? Yellow.  ? Bad-smelling.  · You have very pain or cramps in your lower belly that do not get better with medicine.  · You feel light-headed.  · You feel dizzy.  · You pass out (faint).  Summary  · If you did not have a tissue sample removed (did not have a biopsy), you may only have some spotting for a few days. You can go back to your normal activities.  · If you had a tissue sample removed, it is common to have mild pain and spotting for 48 hours.  · For 3 days, or as long as your doctor tells you, avoid douching, using tampons and having sex.  · Get help right away if you have bleeding, very bad pain, or signs of infection.  This information is not intended to replace advice given to you by your health care provider. Make sure you discuss any questions you have with your health care provider.  Document Released: 01/17/2008 Document Revised: 04/19/2016 Document Reviewed: 04/19/2016  Elsevier Interactive Patient Education © 2018 Elsevier Inc.

## 2017-09-26 NOTE — Progress Notes (Signed)
    COLPOSCOPY PROCEDURE NOTE  27 y.o. G1P0010 Pregnancy status: Unknown Indications: LSIL Path report -  "LSIL, mild dysplasia, at least, is present. Cells   suspicious for a high-grade lesion are also present"  HPV:  not tested Cervical History:  Previous Abnormal Pap: yes, per patient, by does not know what grade  Previous Colposcopy: no  Previous LEEP or Cryo: no  Patient given informed consent, signed copy in the chart, time out was performed.  Placed in lithotomy position. Cervix viewed with speculum and colposcope after application of acetic acid.   Colposcopy adequate? Yes  Acetowhite lesion(s) noted at 3, 9 and 12 o'clock and abnormal vessels noted at 9 and 12 o'clock; changes suspicious for CIN II; corresponding biopsies obtained.  ECC specimen obtained. All specimens were labeled and sent to pathology   Patient was given post procedure instructions.  Will follow up pathology and manage accordingly; patient will be contacted with results and recommendations.  Routine preventative health maintenance measures emphasized.  Raynelle FanningJulie P. Mikaia Janvier, MD OB Fellow

## 2017-09-26 NOTE — BH Specialist Note (Signed)
Integrated Behavioral Health Initial Visit  MRN: [id number] Name: Beth [name]  Number of Integrated Behavioral Health Clinician visits:: 1/6 Session Start time:  4:33 PM Session End time: 4:43 PM  Total time: 10 minutes  Type of Service: Integrated Behavioral Health- Individual/Family Interpretor:No. Interpretor Name and Language: n/a   Warm Hand Off Completed.       SUBJECTIVE: Beth [name] is a 27 y.o. female accompanied by n/a Patient was referred by Dr Nira Retortegele for recent SI. Patient reports the following symptoms/concerns: Pt states she has no concerns today, and feels she is coping well with her symptoms. [facility], has recently changed medications, denies any current SI; no HI. Pt says she is feeling better since beginning new meds.  Pt is open to taking educational materials today.  Duration of problem: Undetermined; Severity of problem: severe  OBJECTIVE: Mood: Depressed and Affect: Depressed Risk of harm to self or others: Suicidal ideation No plan to harm self or others  LIFE CONTEXT: Family and Social: - School/Work: Works full-time 3rd shift Self-Care: Actively taking steps to manage her health care needs Life Changes: [facility]  GOALS ADDRESSED: Patient will: 1. Reduce symptoms of: anxiety and depression 2. Increase knowledge and/or ability of: self-management skills  3. Demonstrate ability to: Increase healthy adjustment to current life circumstances  INTERVENTIONS: Interventions utilized: Psychoeducation and/or Health Education  Standardized Assessments completed: GAD-7 and PHQ 9  ASSESSMENT: Patient currently experiencing Major depressive disorder, recurrent, severe, with psychosis, per Dr Rene KocherEksir, 09/13/2017   Patient may benefit from staff for safety and brief supportive counseling today.  PLAN: 1. Follow up with behavioral health clinician on : As needed 2. Behavioral recommendations:  -[facility]  [facility] -Continue with previously established safety plan -Consider reading educational materials regarding coping with symptoms of anxiety and depression 3. Referral(s): Integrated Behavioral Health Services (In Clinic) 4. "From scale of 1-10, how likely are you to follow plan?": 9  Rae LipsJamie C , LCSW   Depression screen [facility] 2/9 09/26/2017 12/02/2015  Decreased Interest 1 0  Down, Depressed, Hopeless 1 0  PHQ - 2 Score 2 0  Altered sleeping 2 -  Tired, decreased energy 2 -  Change in appetite 2 -  Feeling bad or failure about yourself  1 -  Trouble concentrating 2 -  Moving slowly or fidgety/restless 2 -  Suicidal thoughts 1 -  PHQ-9 Score 14 -  Some encounter information is confidential and restricted. Go to Review Flowsheets activity to see all data.   GAD 7 : Generalized Anxiety Score 09/26/2017  Nervous, Anxious, on Edge 3  Control/stop worrying 2  Worry too much - different things 2  Trouble relaxing 2  Restless 2  Easily annoyed or irritable 3  Afraid - awful might happen 3  Total GAD 7 Score 17  Some encounter information is confidential and restricted. Go to Review Flowsheets activity to see all data.

## 2017-10-01 DIAGNOSIS — R87619 Unspecified abnormal cytological findings in specimens from cervix uteri: Secondary | ICD-10-CM | POA: Insufficient documentation

## 2017-10-02 ENCOUNTER — Telehealth: Payer: Self-pay | Admitting: General Practice

## 2017-10-02 NOTE — Telephone Encounter (Signed)
-----   Message from Frederik PearJulie P Degele, MD sent at 10/01/2017 10:30 PM EST ----- CIN I, mild dysplasia. Pt needs repeat pap with co-testing in 12 months

## 2017-10-02 NOTE — Telephone Encounter (Signed)
Called patient & informed her of colpo results & recommended follow up. Patient verbalized understanding & had no questions

## 2017-10-04 ENCOUNTER — Telehealth (HOSPITAL_COMMUNITY): Payer: Self-pay

## 2017-10-04 NOTE — Telephone Encounter (Signed)
Medication managment - Met with patient today as she reported need for Lithium level but was not sure if any other lab orders were needed.  Patient stated she would drop back by in the coming week but wanted to know what all Dr. Daron Offer wanted to order?  Dr. Daron Offer had already left today so agreed to send message to Dr. Daron Offer with question of what labs he wanted on patient as she stated if was not a problem for her to drop back by in the coming week to have these completed.

## 2017-10-05 ENCOUNTER — Encounter: Payer: Self-pay | Admitting: *Deleted

## 2017-10-05 NOTE — Telephone Encounter (Signed)
Lithium, CMP, TSH, Free T4, CBC. Thank yall!

## 2017-12-10 ENCOUNTER — Ambulatory Visit (HOSPITAL_COMMUNITY): Payer: No Typology Code available for payment source | Admitting: Psychiatry

## 2017-12-23 ENCOUNTER — Encounter (HOSPITAL_COMMUNITY): Payer: Self-pay | Admitting: Psychiatry

## 2017-12-23 ENCOUNTER — Other Ambulatory Visit (HOSPITAL_COMMUNITY): Payer: Self-pay | Admitting: Psychiatry

## 2017-12-24 MED ORDER — NORTRIPTYLINE HCL 25 MG PO CAPS: ORAL_CAPSULE | ORAL | 0 refills | Status: DC

## 2017-12-28 ENCOUNTER — Encounter (HOSPITAL_COMMUNITY): Payer: Self-pay | Admitting: Psychiatry

## 2017-12-28 ENCOUNTER — Ambulatory Visit (INDEPENDENT_AMBULATORY_CARE_PROVIDER_SITE_OTHER): Payer: No Typology Code available for payment source | Admitting: Psychiatry

## 2017-12-28 VITALS — BP 101/69 | HR 126 | Ht 62.0 in | Wt 134.2 lb

## 2017-12-28 DIAGNOSIS — Z811 Family history of alcohol abuse and dependence: Secondary | ICD-10-CM | POA: Diagnosis not present

## 2017-12-28 DIAGNOSIS — F1290 Cannabis use, unspecified, uncomplicated: Secondary | ICD-10-CM | POA: Diagnosis not present

## 2017-12-28 DIAGNOSIS — Z810 Family history of intellectual disabilities: Secondary | ICD-10-CM | POA: Diagnosis not present

## 2017-12-28 DIAGNOSIS — F1021 Alcohol dependence, in remission: Secondary | ICD-10-CM | POA: Diagnosis not present

## 2017-12-28 DIAGNOSIS — Z813 Family history of other psychoactive substance abuse and dependence: Secondary | ICD-10-CM

## 2017-12-28 DIAGNOSIS — F333 Major depressive disorder, recurrent, severe with psychotic symptoms: Secondary | ICD-10-CM

## 2017-12-28 DIAGNOSIS — F419 Anxiety disorder, unspecified: Secondary | ICD-10-CM

## 2017-12-28 DIAGNOSIS — R000 Tachycardia, unspecified: Secondary | ICD-10-CM

## 2017-12-28 DIAGNOSIS — Z818 Family history of other mental and behavioral disorders: Secondary | ICD-10-CM

## 2017-12-28 DIAGNOSIS — F17210 Nicotine dependence, cigarettes, uncomplicated: Secondary | ICD-10-CM

## 2017-12-28 DIAGNOSIS — Z975 Presence of (intrauterine) contraceptive device: Secondary | ICD-10-CM

## 2017-12-28 DIAGNOSIS — F4310 Post-traumatic stress disorder, unspecified: Secondary | ICD-10-CM | POA: Diagnosis not present

## 2017-12-28 DIAGNOSIS — Z79899 Other long term (current) drug therapy: Secondary | ICD-10-CM

## 2017-12-28 DIAGNOSIS — R002 Palpitations: Secondary | ICD-10-CM

## 2017-12-28 MED ORDER — NORTRIPTYLINE HCL 75 MG PO CAPS: 75.0000 mg | ORAL_CAPSULE | Freq: Every day | ORAL | 0 refills | Status: DC

## 2017-12-28 MED ORDER — HYDROXYZINE HCL 50 MG PO TABS: 50.0000 mg | ORAL_TABLET | Freq: Four times a day (QID) | ORAL | 1 refills | Status: DC | PRN

## 2017-12-28 MED ORDER — LITHIUM CARBONATE ER 450 MG PO TBCR: 450.0000 mg | EXTENDED_RELEASE_TABLET | Freq: Every day | ORAL | 0 refills | Status: DC

## 2017-12-28 MED ORDER — TRAZODONE HCL 100 MG PO TABS: 200.0000 mg | ORAL_TABLET | Freq: Every day | ORAL | 0 refills | Status: DC

## 2017-12-28 NOTE — Addendum Note (Signed)
Addended by: Rosalita Levan on: 12/28/2017 10:57 AM   Modules accepted: Orders
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
## 2018-01-02 LAB — COMPREHENSIVE METABOLIC PANEL
ALK PHOS: 88 IU/L (ref 39–117)
ALT: 24 IU/L (ref 0–32)
AST: 28 IU/L (ref 0–40)
Albumin/Globulin Ratio: 2 (ref 1.2–2.2)
Albumin: 4.7 g/dL (ref 3.5–5.5)
BILIRUBIN TOTAL: 0.6 mg/dL (ref 0.0–1.2)
BUN/Creatinine Ratio: 10 (ref 9–23)
BUN: 7 mg/dL (ref 6–20)
CHLORIDE: 101 mmol/L (ref 96–106)
CO2: 15 mmol/L — ABNORMAL LOW (ref 20–29)
Calcium: 9.3 mg/dL (ref 8.7–10.2)
Creatinine, Ser: 0.69 mg/dL (ref 0.57–1.00)
GFR calc Af Amer: 137 mL/min/{1.73_m2} (ref >59)
GFR calc non Af Amer: 119 mL/min/{1.73_m2} (ref >59)
GLUCOSE: 99 mg/dL (ref 65–99)
Globulin, Total: 2.4 g/dL (ref 1.5–4.5)
Potassium: 4.3 mmol/L (ref 3.5–5.2)
Sodium: 137 mmol/L (ref 134–144)
TOTAL PROTEIN: 7.1 g/dL (ref 6.0–8.5)

## 2018-01-02 LAB — CBC WITH DIFFERENTIAL/PLATELET
Hematocrit: 43.4 % (ref 34.0–46.6)
Hemoglobin: 13.2 g/dL (ref 11.1–15.9)
MCH: 30.7 pg (ref 26.6–33.0)
MCHC: 30.4 g/dL — ABNORMAL LOW (ref 31.5–35.7)
MCV: 101 fL — ABNORMAL HIGH (ref 79–97)
PLATELETS: 318 10*3/uL (ref 150–450)
RBC: 4.3 x10E6/uL (ref 3.77–5.28)
RDW: 14.3 % (ref 12.3–15.4)
WBC: 6.7 10*3/uL (ref 3.4–10.8)

## 2018-01-02 LAB — LITHIUM LEVEL: Lithium Lvl: 0.2 mmol/L — ABNORMAL LOW (ref 0.6–1.2)

## 2018-01-02 LAB — T4 AND TSH
T4 TOTAL: 7.2 ug/dL (ref 4.5–12.0)
TSH: 1.75 u[IU]/mL (ref 0.450–4.500)

## 2018-01-04 ENCOUNTER — Encounter (HOSPITAL_COMMUNITY): Payer: Self-pay | Admitting: Psychiatry

## 2018-01-11 ENCOUNTER — Ambulatory Visit (HOSPITAL_COMMUNITY)
Admission: RE | Admit: 2018-01-11 | Discharge: 2018-01-11 | Disposition: A | Discharge status: Home / Self Care | Payer: Self-pay | Source: Ambulatory Visit | Attending: Family Medicine | Admitting: Family Medicine

## 2018-01-11 ENCOUNTER — Ambulatory Visit: Payer: Self-pay | Admitting: Family Medicine

## 2018-01-11 ENCOUNTER — Ambulatory Visit (INDEPENDENT_AMBULATORY_CARE_PROVIDER_SITE_OTHER): Payer: No Typology Code available for payment source | Admitting: Family Medicine

## 2018-01-11 ENCOUNTER — Encounter: Payer: Self-pay | Admitting: Family Medicine

## 2018-01-11 ENCOUNTER — Other Ambulatory Visit: Payer: Self-pay

## 2018-01-11 VITALS — BP 102/64 | HR 104 | Temp 98.6°F | Ht 61.0 in | Wt 139.0 lb

## 2018-01-11 DIAGNOSIS — R000 Tachycardia, unspecified: Secondary | ICD-10-CM | POA: Diagnosis not present

## 2018-01-11 DIAGNOSIS — F17200 Nicotine dependence, unspecified, uncomplicated: Secondary | ICD-10-CM | POA: Diagnosis not present

## 2018-01-11 DIAGNOSIS — F1021 Alcohol dependence, in remission: Secondary | ICD-10-CM

## 2018-01-11 DIAGNOSIS — Z720 Tobacco use: Secondary | ICD-10-CM | POA: Insufficient documentation

## 2018-01-11 MED ORDER — NICOTINE 7 MG/24HR TD PT24: 7.0000 mg | MEDICATED_PATCH | Freq: Every day | TRANSDERMAL | 0 refills | Status: DC

## 2018-01-11 NOTE — Assessment & Plan Note (Signed)
Patient going to AA and sobriety date of 09/07/2016.

## 2018-01-11 NOTE — Progress Notes (Signed)
Subjective: Chief Complaint  Patient presents with  . New Patient (Initial Visit)    HPI: Beth [name] is a 27 y.o. presenting to clinic today to discuss the following:  Tachycardia States that a few weeks ago she was being seen  Chest pain intermittently possibly once every 2 weeks sharp in nature, 4-5/10, lasts less than one minute, has associated shortness of breath, location of pain is difficult for her to localize, unsure if it radiates anywhere, nothing makes it better, random. Patient does endorse she has a history of anxiety and a recent stressor of her boyfriend being jailed.  Patient endorses having episodes of orthostatic hypotension that have been going on for a long time. States she gets dizzy and almost falls if she stands up too fast. She has never lost consciousness.  Sometimes associated dizziness, nausea, sometimes she has vomiting, abdominal pain, diarrhea, constipation. States she has had these "her whole life".  Health Maintenance: None     ROS noted in HPI.   Past Medical, Surgical, Social, and Family History Reviewed & Updated per EMR.   Pertinent Historical Findings include:   Social History   Tobacco Use  Smoking Status Current Some Day Smoker  . Years: 5.00  . Types: Cigarettes  Smokeless Tobacco Never Used  Tobacco Comment   Rarely smokes - 01/11/18    Objective: Ht 5\' 1"  (1.549 m)   Wt 139 lb (63 kg)   BMI 26.26 kg/m  Vitals and nursing notes reviewed  Gen: Alert and Oriented x 3, NAD HEENT: Normocephalic, atraumatic, PERRLA, EOMI, TM visible with good light reflex, non-swollen, non-erythematous turbinates, non-erythematous pharyngeal mucosa, no exudates Neck: trachea midline, no thyroidmegaly, no LAD CV: Tachycardic, regular rhythm, no murmurs, normal S1, S2 split, +2 radial pulses Resp: CTAB, no wheezing, rales, or rhonchi, comfortable work of breathing Abd: non-distended, non-tender, soft, +bs in all four quadrants, no  hepatosplenomegaly MSK: FROM in all four extremities Ext: no clubbing, cyanosis, or edema Neuro: CN II-XII intact, no focal or gross deficits Skin: warm, dry, intact, no rashes Psych: appropriate behavior, mood, denies any suicidal ideation  No results found for this or any previous visit (from the past 72 hour(s)).  Assessment/Plan:  Tobacco use disorder Prescribed nicotine patch to help with smoking cravings but she is doing well with cutting back on her own.  Tachycardia Most likely her transient tachycardia is related to anxiety and recent stressors. Her boyfriend was recently put in jail and she is worried about him. She was started on Nortriptyline about 6 months ago and I did find this could cause tachycardia but it is less likely the cause.  EKG was normal with rate of 90. Discussed tools to help with anxiety such as deep breathing and relaxation techniques.  No need for further work up at this time. Discussed concerning symptoms that would warrant a return visit for further work up such as worsening or changing chest pain, SOB, difficulty breathing, or syncope   PATIENT EDUCATION PROVIDED: See AVS    Diagnosis and plan along with any newly prescribed medication(s) were discussed in detail with this patient today. The patient verbalized understanding and agreed with the plan. Patient advised if symptoms worsen return to clinic or ER.   Health Maintainance:   Orders Placed This Encounter  Procedures  . EKG 12-Lead    Meds ordered this encounter  Medications  . nicotine (NICODERM CQ - DOSED IN MG/24 HR) 7 mg/24hr patch    Sig: Place  1 patch (7 mg total) onto the skin daily.    Dispense:  28 patch    Refill:  0    Jules Schick, DO 01/11/2018, 8:46 AM PGY-1, [facility]

## 2018-01-11 NOTE — Assessment & Plan Note (Signed)
Prescribed nicotine patch to help with smoking cravings but she is doing well with cutting back on her own.

## 2018-01-11 NOTE — Assessment & Plan Note (Addendum)
Most likely her transient tachycardia is related to anxiety and recent stressors. Her boyfriend was recently put in jail and she is worried about him. She was started on Nortriptyline about 6 months ago and I did find this could cause tachycardia but it is less likely the cause.  EKG was normal with rate of 90. No arrthymia, prolong QT, ST segment changes or other concerning features. Discussed tools to help with anxiety such as deep breathing and relaxation techniques.  No need for further work up at this time. Discussed concerning symptoms that would warrant a return visit for further work up such as worsening or changing chest pain, SOB, difficulty breathing, or syncope

## 2018-01-11 NOTE — Patient Instructions (Addendum)
It was great to meet you today! Thank you for letting me participate in your care!  Today, we discussed establishing care with our clinic and I am pleased to be a part of your healthcare. Today, we discussed your current medications, past medical history, and your recent fast heart rate. The EKG we did today showed sinus tachycardia. At this time   Please utilize the 1-800-QUITNOW and the website at [email or web address]  I have sent you nicotine patches that you can pick up from your pharmacy.  Please be sure to remember to have a repeat pap smear in December of 2019.  Be well, Beth Schickim Furkan Keenum, DO PGY-1, [facility]

## 2018-01-18 ENCOUNTER — Ambulatory Visit (INDEPENDENT_AMBULATORY_CARE_PROVIDER_SITE_OTHER): Payer: No Typology Code available for payment source | Admitting: Licensed Clinical Social Worker

## 2018-01-18 DIAGNOSIS — F333 Major depressive disorder, recurrent, severe with psychotic symptoms: Secondary | ICD-10-CM

## 2018-01-18 DIAGNOSIS — F1021 Alcohol dependence, in remission: Secondary | ICD-10-CM

## 2018-01-22 ENCOUNTER — Encounter (HOSPITAL_COMMUNITY): Payer: Self-pay | Admitting: Licensed Clinical Social Worker
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
## 2018-02-09 ENCOUNTER — Ambulatory Visit (HOSPITAL_COMMUNITY): Payer: No Typology Code available for payment source | Admitting: Psychiatry

## 2018-02-09 ENCOUNTER — Other Ambulatory Visit: Payer: Self-pay

## 2018-02-09 ENCOUNTER — Encounter (HOSPITAL_COMMUNITY): Payer: Self-pay | Admitting: Psychiatry

## 2018-02-09 ENCOUNTER — Ambulatory Visit (INDEPENDENT_AMBULATORY_CARE_PROVIDER_SITE_OTHER): Payer: No Typology Code available for payment source | Admitting: Psychiatry

## 2018-02-09 DIAGNOSIS — F4310 Post-traumatic stress disorder, unspecified: Secondary | ICD-10-CM

## 2018-02-09 DIAGNOSIS — F332 Major depressive disorder, recurrent severe without psychotic features: Secondary | ICD-10-CM | POA: Diagnosis not present

## 2018-02-09 DIAGNOSIS — F1021 Alcohol dependence, in remission: Secondary | ICD-10-CM | POA: Diagnosis not present

## 2018-02-09 MED ORDER — NORTRIPTYLINE HCL 50 MG PO CAPS: 50.0000 mg | ORAL_CAPSULE | Freq: Every day | ORAL | 0 refills | Status: DC

## 2018-02-09 MED ORDER — FLUOXETINE HCL 20 MG PO CAPS: 20.0000 mg | ORAL_CAPSULE | Freq: Every day | ORAL | 0 refills | Status: DC
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
## 2018-02-19 ENCOUNTER — Ambulatory Visit (INDEPENDENT_AMBULATORY_CARE_PROVIDER_SITE_OTHER): Payer: BLUE CROSS/BLUE SHIELD | Admitting: Licensed Clinical Social Worker

## 2018-02-19 ENCOUNTER — Encounter (HOSPITAL_COMMUNITY): Payer: Self-pay | Admitting: Licensed Clinical Social Worker

## 2018-02-19 DIAGNOSIS — F4310 Post-traumatic stress disorder, unspecified: Secondary | ICD-10-CM

## 2018-02-19 DIAGNOSIS — F1021 Alcohol dependence, in remission: Secondary | ICD-10-CM

## 2018-02-19 DIAGNOSIS — F332 Major depressive disorder, recurrent severe without psychotic features: Secondary | ICD-10-CM | POA: Diagnosis not present
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
## 2018-02-24 ENCOUNTER — Encounter: Payer: Self-pay | Admitting: Family Medicine

## 2018-02-26 ENCOUNTER — Ambulatory Visit: Payer: BLUE CROSS/BLUE SHIELD

## 2018-03-04 ENCOUNTER — Ambulatory Visit: Payer: BLUE CROSS/BLUE SHIELD | Admitting: Family Medicine

## 2018-03-04 ENCOUNTER — Other Ambulatory Visit: Payer: Self-pay

## 2018-03-04 ENCOUNTER — Encounter: Payer: Self-pay | Admitting: Family Medicine

## 2018-03-04 VITALS — BP 104/68 | HR 94 | Temp 98.2°F | Ht 61.0 in | Wt 140.0 lb

## 2018-03-04 DIAGNOSIS — L0591 Pilonidal cyst without abscess: Secondary | ICD-10-CM

## 2018-03-04 DIAGNOSIS — N760 Acute vaginitis: Secondary | ICD-10-CM | POA: Diagnosis not present

## 2018-03-04 DIAGNOSIS — B9689 Other specified bacterial agents as the cause of diseases classified elsewhere: Secondary | ICD-10-CM

## 2018-03-04 HISTORY — DX: Pilonidal cyst without abscess: L05.91

## 2018-03-04 LAB — POCT WET PREP (WET MOUNT)
CLUE CELLS WET PREP WHIFF POC: POSITIVE
Trichomonas Wet Prep HPF POC: ABSENT

## 2018-03-04 MED ORDER — METRONIDAZOLE 500 MG PO TABS: 500.0000 mg | ORAL_TABLET | Freq: Two times a day (BID) | ORAL | 0 refills | Status: DC

## 2018-03-04 NOTE — Progress Notes (Signed)
   Subjective:   Patient ID: MRN [id number]  Date of birth: [birth year]  HPI Beth [name] is a 27 y.o. female with past medical history significant for MDD, AUD, PTSD, LGSIL, Tobacco use, who presents to clinic with complaints of painful lesion on buttock and odorous vaginal discharge.   #1: Bump on Buttock The location of "bump" is at apex of gluteal cleft.  Patient reports episodes of swelling and drainage for the past several years and are always located in the same spot.  She reports varying levels of pain when swelling occurs.  She is unable to describe lesion today, as it is an awkward place to observe.  She does not know any modifying finding factors that are associated with recurrent symptoms.  She does not have any associated symptoms.She reports drainage/bleeding last occurred on February 23, 2018.  Today, she denies fevers, chills, swelling, discharge, erythema, or sensitivity to touch around the area.   #2: Vaginal Discharge and Odor Patient complains of odorous vaginal discharge that is been present for the last few days.  She reports no sexual activity since her last STD screening in January 2019.  Patient request pelvic exam for lab testing.  Review of Symptoms:  Otherwise negative except for those mentioned in HPI  HISTORY Medications, Allergies, Past Medical, Surgical, Social, and Family History Reviewed & Updated per EMR.   Pertinent Historical Findings include:  PMHx:   Previous episodes of yeast infections and BV  Family History:   No history of abscess or cyst in family members Social History:  Currently sexually active   Objective:  BP 104/68   Pulse 94   Temp 98.2 F (36.8 C) (Oral)   Ht 5\' 1"  (1.549 m)   Wt 140 lb (63.5 kg)   SpO2 98%   BMI 26.45 kg/m   Physical Exam:  Gen: NAD, alert, non-toxic, well-nourished, well-appearing, sitting comfortably on exam table chair  HEENT: Normocephaic, atraumatic. Skin: No obvious rashes, lesions, or trauma.  Normal  turgor.  Buttock: Small purplish lesion 4mm tall x 1mm wide appreciated at apex of gluteal cleft. No surrounding erythema, non tender and no warmth to touch. No nodule, mass, or body of fluid appreciated on palpation. Lesion is slightly concave compared to surrounding surface.  MSK: Normal ROM. Appropriate muscle bulk strength and tone Psych: Cooperative with exam. Pleasant. Makes eye contact. Genitourinary: deferred.  GU: External vulva and vagina nonerythematous, without any obvious lesions or rash. Discharge is white, thin, with no obvious odor. IUD strings appreciated. Cervix has multiple irregularly shaped, healed lesions on surface and non-friable.    Pertinent Labs & Imaging:   Wet Prep: Moderate clue cells and bacteria.    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 Patient reports never needing medical drainage previously.  Last time it drained was Saturday, February 23, 2018.  Today, there are no signs of infection, cyst, or abscess formation.  Gave patient return precautions.  Most likely will need surgery referral for ultimate removal.  In the meantime, she can follow with family medicine.  BV (bacterial vaginosis) Patient reports odorous discharge for the last few days.  Wet prep shows moderate clue cells and bacteria.  Sent 500 mg metronidazole twice daily for 7 days to pharmacy.  Gave patient return precautions.   Beth [name], M.D. 03/04/2018, 8:36 AM PGY-1, [facility]

## 2018-03-04 NOTE — Assessment & Plan Note (Signed)
Patient reports odorous discharge for the last few days.  Wet prep shows moderate clue cells and bacteria.  Sent 500 mg metronidazole twice daily for 7 days to pharmacy.  Gave patient return precautions.

## 2018-03-04 NOTE — Assessment & Plan Note (Addendum)
Reports history of recurrent swelling, pain, drainage from the uppermost area in cleft of her buttocks. Patient reports never needing medical drainage previously.  Last time it drained was Saturday, February 23, 2018.  Today, there are no signs of infection, cyst, or abscess formation.  Gave patient return precautions.  Most likely will need surgery referral for ultimate removal.  In the meantime, she can follow with family medicine.

## 2018-03-04 NOTE — Patient Instructions (Addendum)
Dear Beth [name],   It was nice to meet you today! I am glad you came in for your concerns. This document serves as a "wrap-up" to all that we discussed today and is listed as follows:    We talked about the lesion at the apex of your buttocks.  Since it currently is not symptomatic, there is nothing to do with it today.  If you notice that it does get swollen, draining, warmth around the area please call to make an appointment for evaluation. It looks as if it may be a structural issue since it reoccurs in the same spot. If this is the case, ultimately, you would see surgery for definite removal.  If you develop high fever during recurrence of the bump, please go to the nearest ED for evaluation.  Your lab test was positive for BV.  I will send metronidazole to your pharmacy.  Take 500 mg twice daily for 7 days.  If your symptoms do not resolve after this treatment course, please make an appointment for follow-up.   Orders Placed This Encounter  Procedures  . POCT Wet Prep [facility]([place])     Thank you [facility] for your primary care needs and stay well!   Best,   Dr. Genia Hotterachel [place] Resident Physician [facility] [phone]    Don't forget to sign up for MyChart for instant access to your health profile, labs, orders, upcoming appointments or to contact your provider with questions. Stop at the front desk on the way out for more information about how to sign up!

## 2018-03-21 ENCOUNTER — Ambulatory Visit (HOSPITAL_COMMUNITY): Payer: No Typology Code available for payment source | Admitting: Psychiatry

## 2018-03-22 ENCOUNTER — Ambulatory Visit (INDEPENDENT_AMBULATORY_CARE_PROVIDER_SITE_OTHER): Payer: BLUE CROSS/BLUE SHIELD | Admitting: Licensed Clinical Social Worker

## 2018-03-22 DIAGNOSIS — F4310 Post-traumatic stress disorder, unspecified: Secondary | ICD-10-CM | POA: Diagnosis not present

## 2018-03-22 DIAGNOSIS — F332 Major depressive disorder, recurrent severe without psychotic features: Secondary | ICD-10-CM

## 2018-03-22 DIAGNOSIS — F1021 Alcohol dependence, in remission: Secondary | ICD-10-CM

## 2018-03-27 ENCOUNTER — Encounter (HOSPITAL_COMMUNITY): Payer: Self-pay | Admitting: Licensed Clinical Social Worker
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
## 2018-04-11 ENCOUNTER — Encounter (HOSPITAL_COMMUNITY): Payer: Self-pay | Admitting: Psychiatry

## 2018-04-11 ENCOUNTER — Ambulatory Visit (INDEPENDENT_AMBULATORY_CARE_PROVIDER_SITE_OTHER): Payer: BLUE CROSS/BLUE SHIELD | Admitting: Psychiatry

## 2018-04-11 DIAGNOSIS — F333 Major depressive disorder, recurrent, severe with psychotic symptoms: Secondary | ICD-10-CM

## 2018-04-11 DIAGNOSIS — R45851 Suicidal ideations: Secondary | ICD-10-CM

## 2018-04-11 DIAGNOSIS — F332 Major depressive disorder, recurrent severe without psychotic features: Secondary | ICD-10-CM

## 2018-04-11 DIAGNOSIS — F1021 Alcohol dependence, in remission: Secondary | ICD-10-CM | POA: Diagnosis not present

## 2018-04-11 DIAGNOSIS — F4310 Post-traumatic stress disorder, unspecified: Secondary | ICD-10-CM

## 2018-04-11 DIAGNOSIS — G4700 Insomnia, unspecified: Secondary | ICD-10-CM

## 2018-04-11 DIAGNOSIS — Z813 Family history of other psychoactive substance abuse and dependence: Secondary | ICD-10-CM

## 2018-04-11 DIAGNOSIS — Z818 Family history of other mental and behavioral disorders: Secondary | ICD-10-CM | POA: Diagnosis not present

## 2018-04-11 DIAGNOSIS — F17210 Nicotine dependence, cigarettes, uncomplicated: Secondary | ICD-10-CM

## 2018-04-11 DIAGNOSIS — Z811 Family history of alcohol abuse and dependence: Secondary | ICD-10-CM

## 2018-04-11 MED ORDER — HYDROXYZINE HCL 50 MG PO TABS: 50.0000 mg | ORAL_TABLET | Freq: Four times a day (QID) | ORAL | 1 refills | Status: DC | PRN

## 2018-04-11 MED ORDER — FLUOXETINE HCL 40 MG PO CAPS: 40.0000 mg | ORAL_CAPSULE | Freq: Every day | ORAL | 0 refills | Status: DC

## 2018-04-11 MED ORDER — NORTRIPTYLINE HCL 50 MG PO CAPS: 50.0000 mg | ORAL_CAPSULE | Freq: Every day | ORAL | 0 refills | Status: DC

## 2018-04-11 MED ORDER — LITHIUM CARBONATE ER 450 MG PO TBCR: 450.0000 mg | EXTENDED_RELEASE_TABLET | Freq: Every day | ORAL | 0 refills | Status: DC

## 2018-04-11 MED ORDER — TRAZODONE HCL 100 MG PO TABS: 200.0000 mg | ORAL_TABLET | Freq: Every day | ORAL | 0 refills | Status: DC
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
## 2018-04-24 DIAGNOSIS — F332 Major depressive disorder, recurrent severe without psychotic features: Secondary | ICD-10-CM | POA: Diagnosis not present

## 2018-04-26 ENCOUNTER — Encounter

## 2018-04-26 ENCOUNTER — Ambulatory Visit (INDEPENDENT_AMBULATORY_CARE_PROVIDER_SITE_OTHER): Payer: BLUE CROSS/BLUE SHIELD | Admitting: Licensed Clinical Social Worker

## 2018-04-26 ENCOUNTER — Encounter (HOSPITAL_COMMUNITY): Payer: Self-pay | Admitting: Licensed Clinical Social Worker

## 2018-04-26 DIAGNOSIS — F333 Major depressive disorder, recurrent, severe with psychotic symptoms: Secondary | ICD-10-CM

## 2018-04-26 DIAGNOSIS — F1021 Alcohol dependence, in remission: Secondary | ICD-10-CM
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
## 2018-05-05 ENCOUNTER — Encounter: Payer: Self-pay | Admitting: Family Medicine

## 2018-05-07 ENCOUNTER — Ambulatory Visit: Payer: BLUE CROSS/BLUE SHIELD

## 2018-05-08 ENCOUNTER — Telehealth (HOSPITAL_COMMUNITY): Payer: Self-pay | Admitting: *Deleted

## 2018-05-08 NOTE — Telephone Encounter (Signed)
Patient called for refill of Lithium. Chart reviewed and Lithium was ordered 04/11/18 for 3 month supply. Called to notify patient. Refill not appropriate/needed at this time.

## 2018-05-15 DIAGNOSIS — F332 Major depressive disorder, recurrent severe without psychotic features: Secondary | ICD-10-CM | POA: Diagnosis not present

## 2018-05-21 ENCOUNTER — Ambulatory Visit (INDEPENDENT_AMBULATORY_CARE_PROVIDER_SITE_OTHER): Payer: BLUE CROSS/BLUE SHIELD | Admitting: Psychiatry

## 2018-05-21 DIAGNOSIS — F4310 Post-traumatic stress disorder, unspecified: Secondary | ICD-10-CM

## 2018-05-21 DIAGNOSIS — F331 Major depressive disorder, recurrent, moderate: Secondary | ICD-10-CM | POA: Diagnosis not present

## 2018-05-21 DIAGNOSIS — F1021 Alcohol dependence, in remission: Secondary | ICD-10-CM

## 2018-05-21 MED ORDER — TRAZODONE HCL 100 MG PO TABS: 200.0000 mg | ORAL_TABLET | Freq: Every day | ORAL | 0 refills | Status: DC

## 2018-05-21 MED ORDER — FLUOXETINE HCL 40 MG PO CAPS: 40.0000 mg | ORAL_CAPSULE | Freq: Every day | ORAL | 0 refills | Status: DC

## 2018-05-21 MED ORDER — NORTRIPTYLINE HCL 50 MG PO CAPS: 50.0000 mg | ORAL_CAPSULE | Freq: Every day | ORAL | 0 refills | Status: DC

## 2018-05-21 MED ORDER — LITHIUM CARBONATE ER 450 MG PO TBCR: 450.0000 mg | EXTENDED_RELEASE_TABLET | Freq: Every day | ORAL | 0 refills | Status: DC

## 2018-05-21 MED ORDER — HYDROXYZINE HCL 50 MG PO TABS: 50.0000 mg | ORAL_TABLET | Freq: Four times a day (QID) | ORAL | 1 refills | Status: DC | PRN
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
## 2018-05-30 DIAGNOSIS — F332 Major depressive disorder, recurrent severe without psychotic features: Secondary | ICD-10-CM | POA: Diagnosis not present

## 2018-05-31 DIAGNOSIS — F332 Major depressive disorder, recurrent severe without psychotic features: Secondary | ICD-10-CM | POA: Diagnosis not present

## 2018-06-03 DIAGNOSIS — F332 Major depressive disorder, recurrent severe without psychotic features: Secondary | ICD-10-CM | POA: Diagnosis not present

## 2018-06-04 DIAGNOSIS — F332 Major depressive disorder, recurrent severe without psychotic features: Secondary | ICD-10-CM | POA: Diagnosis not present

## 2018-06-05 DIAGNOSIS — F332 Major depressive disorder, recurrent severe without psychotic features: Secondary | ICD-10-CM | POA: Diagnosis not present

## 2018-06-07 DIAGNOSIS — F332 Major depressive disorder, recurrent severe without psychotic features: Secondary | ICD-10-CM | POA: Diagnosis not present

## 2018-06-17 DIAGNOSIS — F332 Major depressive disorder, recurrent severe without psychotic features: Secondary | ICD-10-CM | POA: Diagnosis not present

## 2018-06-20 DIAGNOSIS — F332 Major depressive disorder, recurrent severe without psychotic features: Secondary | ICD-10-CM | POA: Diagnosis not present

## 2018-06-24 DIAGNOSIS — F332 Major depressive disorder, recurrent severe without psychotic features: Secondary | ICD-10-CM | POA: Diagnosis not present

## 2018-06-26 DIAGNOSIS — F332 Major depressive disorder, recurrent severe without psychotic features: Secondary | ICD-10-CM | POA: Diagnosis not present

## 2018-06-27 DIAGNOSIS — F332 Major depressive disorder, recurrent severe without psychotic features: Secondary | ICD-10-CM | POA: Diagnosis not present

## 2018-07-01 DIAGNOSIS — F332 Major depressive disorder, recurrent severe without psychotic features: Secondary | ICD-10-CM | POA: Diagnosis not present

## 2018-07-02 DIAGNOSIS — F332 Major depressive disorder, recurrent severe without psychotic features: Secondary | ICD-10-CM | POA: Diagnosis not present

## 2018-07-04 DIAGNOSIS — F332 Major depressive disorder, recurrent severe without psychotic features: Secondary | ICD-10-CM | POA: Diagnosis not present

## 2018-07-05 DIAGNOSIS — F332 Major depressive disorder, recurrent severe without psychotic features: Secondary | ICD-10-CM | POA: Diagnosis not present

## 2018-07-08 ENCOUNTER — Ambulatory Visit (INDEPENDENT_AMBULATORY_CARE_PROVIDER_SITE_OTHER): Payer: BLUE CROSS/BLUE SHIELD | Admitting: Licensed Clinical Social Worker

## 2018-07-08 DIAGNOSIS — F4310 Post-traumatic stress disorder, unspecified: Secondary | ICD-10-CM | POA: Diagnosis not present

## 2018-07-08 DIAGNOSIS — F331 Major depressive disorder, recurrent, moderate: Secondary | ICD-10-CM

## 2018-07-09 ENCOUNTER — Encounter (HOSPITAL_COMMUNITY): Payer: Self-pay | Admitting: Licensed Clinical Social Worker
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
## 2018-07-17 ENCOUNTER — Ambulatory Visit: Payer: Self-pay | Admitting: Family Medicine

## 2018-07-19 ENCOUNTER — Ambulatory Visit (INDEPENDENT_AMBULATORY_CARE_PROVIDER_SITE_OTHER): Payer: BLUE CROSS/BLUE SHIELD | Admitting: Family Medicine

## 2018-07-19 ENCOUNTER — Other Ambulatory Visit (HOSPITAL_COMMUNITY)
Admission: RE | Admit: 2018-07-19 | Discharge: 2018-07-19 | Disposition: A | Discharge status: Home / Self Care | Payer: BLUE CROSS/BLUE SHIELD | Source: Ambulatory Visit | Attending: Family Medicine | Admitting: Family Medicine

## 2018-07-19 ENCOUNTER — Encounter: Payer: Self-pay | Admitting: Family Medicine

## 2018-07-19 VITALS — BP 99/70 | HR 105 | Temp 98.2°F | Wt 150.6 lb

## 2018-07-19 DIAGNOSIS — R635 Abnormal weight gain: Secondary | ICD-10-CM | POA: Diagnosis not present

## 2018-07-19 DIAGNOSIS — Z124 Encounter for screening for malignant neoplasm of cervix: Secondary | ICD-10-CM

## 2018-07-19 DIAGNOSIS — Z23 Encounter for immunization: Secondary | ICD-10-CM

## 2018-07-19 DIAGNOSIS — L0591 Pilonidal cyst without abscess: Secondary | ICD-10-CM

## 2018-07-19 DIAGNOSIS — Z113 Encounter for screening for infections with a predominantly sexual mode of transmission: Secondary | ICD-10-CM

## 2018-07-19 DIAGNOSIS — N898 Other specified noninflammatory disorders of vagina: Secondary | ICD-10-CM | POA: Diagnosis not present

## 2018-07-19 LAB — POCT WET PREP (WET MOUNT)
Clue Cells Wet Prep Whiff POC: NEGATIVE
Trichomonas Wet Prep HPF POC: ABSENT

## 2018-07-19 NOTE — Progress Notes (Signed)
  Patient Name: Beth [name] Date of Birth: [birth year] Date of Visit: 07/19/18 PCP: Arlyce HarmanLockamy, Timothy, DO  Chief Complaint: pap smear   Subjective: Beth [name] is a pleasant 27 y.o. year old presenting for cervical cancer screening.  The patient has a history of an abnormal Pap.  She had a LSIL Pap followed by [facility] on colposcopy.  She is due for cotesting today.   [name] reports she is overall okay--very upset she continues to gain weight. She has been started on a number of new medications over the past year. She endorses some overeating patterns- particularly bored at work. She denies purging, laxative use, food refusal. Denies SI/HI.   [name] has a history of a pilonidal cyst. This began to worsen 1 week ago. It was large, red painful. Yesterday, drained a considerable amount of purulent material. Denies fevers, constipation, hematochezia. This occurs 2-3 times per year. Has never seen general surgery for excision.   [name] has a new partner. She has a Mirena IUD  [name] reports vaginal odor X1 week. No douching products. She has a new partner and would like STI testing.   ROS:  ROS As above.  I have reviewed the patient's medical, surgical, family, and social history as appropriate.   Vitals:   07/19/18 0847  BP: 99/70  Pulse: (!) 105  Temp: 98.2 F (36.8 C)  SpO2: 99%   Filed Weights   07/19/18 0847  Weight: 150 lb 9.6 oz (68.3 kg)  HEENT: Sclera anicteric. Dentition is moderate. Appears well hydrated. Neck: Supple Cardiac: Regular rate and rhythm. Normal S1/S2. No murmurs, rubs, or gallops appreciated. Lungs: Clear bilaterally to ascultation.  Skin: At superior aspect of gluteal cleft, erythematous minimally tender macule 1 cm X 1 cm  No fluctuance, no drainage, no nodule appreciated  GU Exam:  Chaperoned exam.  External exam: Normal-appearing female external genitalia.  Vaginal exam notable for Normal appearing discharg.  Cervix without discharge or obvious  lesion, IUD strings in place.   Bimanual exam reveals normal sized uterus no masses appreciated, no pain with examination.  Psych: Pleasant and appropriate   [name] was seen today for gynecologic exam.  Diagnoses and all orders for this visit:  Screening for cervical cancer -     Cytology - PAP([facility] [place])  Gain of weight, discussed this may be related to her new medications.  Reviewed healthy eating habits.  Encouraged her to follow-up to discuss safe ways to manage weight/follow up with nutrition.  -     TSH -     Hemoglobin A1c  Routine screening for STI (sexually transmitted infection) -     HIV antibody (with reflex) -     RPR -     Cervicovaginal ancillary only  Pilonidal cyst  -     Ambulatory referral to General Surgery  Need for immunization against influenza -     Flu Vaccine QUAD 36+ mos IM  Vaginal Discharge, wet mount negative, discussed return precautions.   Terisa Starrarina Brown, MD  Family Medicine Teaching Service

## 2018-07-19 NOTE — Patient Instructions (Signed)
It was wonderful to see you today.  Thank you for choosing [facility].   Please call [phone] with any questions about today's appointment.  Please be sure to schedule follow up at the front  desk before you leave today.   Carina Brown, MD  Family Medicine    

## 2018-07-20 LAB — HEMOGLOBIN A1C
Est. average glucose Bld gHb Est-mCnc: 103 mg/dL
HEMOGLOBIN A1C: 5.2 % (ref 4.8–5.6)

## 2018-07-20 LAB — RPR: RPR: NONREACTIVE

## 2018-07-20 LAB — HIV ANTIBODY (ROUTINE TESTING W REFLEX): HIV SCREEN 4TH GENERATION: NONREACTIVE

## 2018-07-20 LAB — TSH: TSH: 1.85 u[IU]/mL (ref 0.450–4.500)

## 2018-07-22 ENCOUNTER — Encounter (HOSPITAL_COMMUNITY): Payer: Self-pay | Admitting: Psychiatry

## 2018-07-22 ENCOUNTER — Ambulatory Visit (INDEPENDENT_AMBULATORY_CARE_PROVIDER_SITE_OTHER): Payer: BLUE CROSS/BLUE SHIELD | Admitting: Psychiatry

## 2018-07-22 VITALS — BP 103/71 | HR 104 | Ht 61.5 in | Wt 148.0 lb

## 2018-07-22 DIAGNOSIS — F4310 Post-traumatic stress disorder, unspecified: Secondary | ICD-10-CM

## 2018-07-22 DIAGNOSIS — F1021 Alcohol dependence, in remission: Secondary | ICD-10-CM

## 2018-07-22 DIAGNOSIS — F333 Major depressive disorder, recurrent, severe with psychotic symptoms: Secondary | ICD-10-CM | POA: Diagnosis not present

## 2018-07-22 LAB — CERVICOVAGINAL ANCILLARY ONLY
CHLAMYDIA, DNA PROBE: NEGATIVE
Neisseria Gonorrhea: NEGATIVE

## 2018-07-22 MED ORDER — TRAZODONE HCL 100 MG PO TABS: 200.0000 mg | ORAL_TABLET | Freq: Every day | ORAL | 0 refills | Status: DC

## 2018-07-22 MED ORDER — HYDROXYZINE HCL 50 MG PO TABS: 50.0000 mg | ORAL_TABLET | Freq: Four times a day (QID) | ORAL | 1 refills | Status: DC | PRN

## 2018-07-22 MED ORDER — LITHIUM CARBONATE ER 300 MG PO TBCR: 600.0000 mg | EXTENDED_RELEASE_TABLET | Freq: Every day | ORAL | 0 refills | Status: DC

## 2018-07-22 MED ORDER — NORTRIPTYLINE HCL 50 MG PO CAPS: 50.0000 mg | ORAL_CAPSULE | Freq: Every day | ORAL | 0 refills | Status: DC

## 2018-07-22 MED ORDER — FLUOXETINE HCL 40 MG PO CAPS: 40.0000 mg | ORAL_CAPSULE | Freq: Every day | ORAL | 0 refills | Status: DC
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
## 2018-07-24 LAB — CYTOLOGY - PAP
DIAGNOSIS: NEGATIVE
HPV (WINDOPATH): DETECTED — AB

## 2018-07-30 ENCOUNTER — Telehealth: Payer: Self-pay | Admitting: Family Medicine

## 2018-07-30 NOTE — Telephone Encounter (Signed)
Left voicemail to return call. 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  Will try again tomorrow.   Terisa Starrarina Audry Kauzlarich, MD  Family Medicine Teaching Service

## 2018-07-30 NOTE — Telephone Encounter (Signed)
Patient returned call. Is on lunch break until 2:44.  Call back is [phone]  Ples SpecterAlisa Brake, RN [facility] RN)

## 2018-07-30 NOTE — Telephone Encounter (Signed)
Called with results. Pap smear normal cytology HPV POSITIVE. History of LSIL and CIN I. Discussed recommendation for colposcopy. Reviewed all other labs which were normal.  Nursing- please call to help schedule colposcopy.   Terisa Starrarina Brown, MD  Family Medicine Teaching Service

## 2018-07-31 ENCOUNTER — Encounter: Payer: Self-pay | Admitting: Family Medicine

## 2018-08-01 ENCOUNTER — Ambulatory Visit (INDEPENDENT_AMBULATORY_CARE_PROVIDER_SITE_OTHER): Payer: BLUE CROSS/BLUE SHIELD | Admitting: Licensed Clinical Social Worker

## 2018-08-01 ENCOUNTER — Encounter (HOSPITAL_COMMUNITY): Payer: Self-pay | Admitting: Licensed Clinical Social Worker

## 2018-08-01 DIAGNOSIS — F1021 Alcohol dependence, in remission: Secondary | ICD-10-CM | POA: Diagnosis not present

## 2018-08-01 DIAGNOSIS — F4310 Post-traumatic stress disorder, unspecified: Secondary | ICD-10-CM | POA: Diagnosis not present

## 2018-08-01 NOTE — Progress Notes (Signed)
   THERAPIST PROGRESS NOTE  Session Time: 10-11  Participation Level: Active  Behavioral Response: Casual and Fairly GroomedAlertEuthymic  Type of Therapy: Individual Therapy  Treatment Goals addressed: Diagnosis: PTSD  Interventions: CBT and Meditation: Brainspotting  Summary: Beth [name] is a 27 y.o. female who presents with PTSD and hx of substance use disorders in remission.  Pt is calm, makes good eye contact, and mood is stable throughout session. Pt stated she feels more manic and has been spending money on a tattoo she cannot afford. Pt and counselor uses visual scanning technique based in Brainspotting theory to help pt gain insight and awareness of her physiological experience of trauma spots. Pt focuses on difference b/w feelings on left and right visual side. Pt identifies left side as "Secure" and her right side as "more guarded". Counselor reflects on various paradoxes that pt states including her people pleasing bx combined w/ her desire to change other people. Pt identifies an area to work on is allowing "others to have their own feeligns".    Suicidal/Homicidal: Nowithout intent/plan  Therapist Response: Counselor used open questions, active listening, supportive encouragement. Counselor used Brainspotting to help pt gain awareness of traumatic sxs. Pt processed heavily and openly about her feelings of security vs. insecurity. Pt continues to be active and engaged in [employer], is [employer] meetings.   Plan: Return again in 4 weeks.  Diagnosis:    ICD-10-CM   1. Post traumatic stress disorder (PTSD) F43.10   2. Alcohol use disorder, severe, in sustained remission [facility](HCC) F10.21       Margo CommonWesley E Swan, LCAS-A 08/01/2018

## 2018-08-21 ENCOUNTER — Ambulatory Visit (INDEPENDENT_AMBULATORY_CARE_PROVIDER_SITE_OTHER): Payer: BLUE CROSS/BLUE SHIELD | Admitting: Licensed Clinical Social Worker

## 2018-08-21 DIAGNOSIS — F4310 Post-traumatic stress disorder, unspecified: Secondary | ICD-10-CM

## 2018-08-21 DIAGNOSIS — F331 Major depressive disorder, recurrent, moderate: Secondary | ICD-10-CM

## 2018-08-21 DIAGNOSIS — F1021 Alcohol dependence, in remission: Secondary | ICD-10-CM | POA: Diagnosis not present

## 2018-08-22 ENCOUNTER — Ambulatory Visit (HOSPITAL_COMMUNITY): Payer: BLUE CROSS/BLUE SHIELD | Admitting: Licensed Clinical Social Worker

## 2018-08-26 ENCOUNTER — Encounter (HOSPITAL_COMMUNITY): Payer: Self-pay | Admitting: Licensed Clinical Social Worker
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
## 2018-08-29 ENCOUNTER — Ambulatory Visit (HOSPITAL_COMMUNITY): Payer: BLUE CROSS/BLUE SHIELD | Admitting: Licensed Clinical Social Worker

## 2018-09-02 ENCOUNTER — Ambulatory Visit: Payer: BLUE CROSS/BLUE SHIELD | Admitting: Family Medicine

## 2018-09-02 ENCOUNTER — Encounter: Payer: Self-pay | Admitting: Family Medicine

## 2018-09-02 VITALS — BP 110/62 | HR 99 | Temp 98.4°F | Ht 61.0 in | Wt 157.4 lb

## 2018-09-02 DIAGNOSIS — R1013 Epigastric pain: Secondary | ICD-10-CM | POA: Diagnosis not present

## 2018-09-02 DIAGNOSIS — F333 Major depressive disorder, recurrent, severe with psychotic symptoms: Secondary | ICD-10-CM

## 2018-09-02 DIAGNOSIS — Z720 Tobacco use: Secondary | ICD-10-CM

## 2018-09-02 DIAGNOSIS — F509 Eating disorder, unspecified: Secondary | ICD-10-CM | POA: Diagnosis not present

## 2018-09-02 MED ORDER — NICOTINE 21 MG/24HR TD PT24: 21.0000 mg | MEDICATED_PATCH | Freq: Every day | TRANSDERMAL | 1 refills | Status: DC

## 2018-09-02 MED ORDER — NICOTINE 7 MG/24HR TD PT24: 7.0000 mg | MEDICATED_PATCH | Freq: Every day | TRANSDERMAL | 0 refills | Status: DC

## 2018-09-02 MED ORDER — NICOTINE 14 MG/24HR TD PT24: 14.0000 mg | MEDICATED_PATCH | Freq: Every day | TRANSDERMAL | 0 refills | Status: DC

## 2018-09-02 NOTE — Patient Instructions (Addendum)
   I am really glad you want to quit smoking.  This is the best thing to do for your health.  Start with the 21 mg patches use these for 4 weeks.  Apply new patch each day to clean dry skin.  Usually the upper arm works best.  Bonita Quin me know if you notice skin irritation if you have insomnia due to the patch she can take it off at night.   To reduce the likelihood that you smoke while you are driving try the following -Put your cigarettes and lighter in the backseat or the trunk of the car. Wrap 20 remains around your cigarettes.  This reduces the likelihood that you will try to remove all the rubber bands to smoke Take a lollipop with you on the way to work.  Use this as a distractor instead of holding a cigarette you can hold a lollipop.  I do not recommend the use of e-cigarettes  Wyona Almas- Nutritionist-- call [phone]  Please schedule your colposcopy at the front desk   It was wonderful to see you today.  Thank you for choosing [facility].   Please call [phone] with any questions about today's appointment.  Please be sure to schedule follow up at the front  desk before you leave today.   Terisa Starr, MD  Family Medicine

## 2018-09-02 NOTE — Progress Notes (Signed)
Patient Name: Beth [name] Date of Birth: [birth year] Date of Visit: 09/02/18 PCP: Arlyce HarmanLockamy, Timothy, DO  Chief Complaint: check in for mood, discuss weight   Subjective: Beth [name] is a pleasant 28 y.o. year old presenting today for routine care.  Since their last visit they report they have changed their name to Beth [name]. They prefer non-binary pronouns at this time.  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  They very much enjoyed their work.  Beth [name]'s parents recently decided to stop supporting them.  This was related to the sister's wedding.  The patient reports she is able to afford her rent.  Beth [name] has some difficulty affording food at times.  Beth [name] is still in a relationship with a single female partner.  Beth [name] reports they feels comfortable in this relationship.  Tobacco Cessation  Beth [name] smokes 1 pack/day.  The patient desires to quit.  They have not yet set a quit date.  They smoke as soon as they get up in the morning within 4 minutes of awakening.  They do not wake up at night to smoke.  They have smoked for a decade.  No prior successful quit trials.  Binging and purging behavior The patient has a history of bulimia.  They report that since the stressors around the family separation the patient has experienced increase in binging behaviors.  They specifically deny any use of laxatives.  They deny any purging behaviors.  They are interested in seeing Dr. Gerilyn PilgrimSykes.  Dyspepsia  Beth [name] reports 6 months of intermittent stomach discomfort.  The pain occurs intermittently throughout the day.  It is located in the midepigastric area and associated with minimal nausea.  At times the patient has an episode of emesis, this is usually after a spicy meal.  She denies symptoms of reflux including burning sensation or worsening with laying down.  They deny melena, hematochezia, family history of inflammatory bowel disease, night sweats, fevers, constipation.  They  report they have regular soft stools daily. They are vegan. ROS:  ROS Positive for intermittent headaches on left side, unchanged from prior.   I have reviewed the patient's medical, surgical, family, and social history as appropriate.   Vitals:   09/02/18 0927  BP: 110/62  Pulse: 99  Temp: 98.4 F (36.9 C)  SpO2: 99%   Filed Weights   09/02/18 0927  Weight: 157 lb 6.4 oz (71.4 kg)   HEENT: Sclera anicteric. Dentition is moderate. Appears well hydrated. Geographic tongue.  Neck: Supple Cardiac: Regular rate and rhythm. Normal S1/S2. No murmurs, rubs, or gallops appreciated. Lungs: Clear bilaterally to ascultation.  Abdomen: Normoactive bowel sounds. No tenderness to deep or light palpation. No rebound or guarding.  Extremities: Warm, well perfused without edema.  Skin: Warm, dry Psych: Pleasant and appropriate   Diagnoses and all orders for this visit:  Tobacco abuse, congratulated, encouraged her to quit.  -     nicotine (NICODERM CQ - DOSED IN MG/24 HOURS) 21 mg/24hr patch; Place 1 patch (21 mg total) onto the skin daily. -     nicotine (NICODERM CQ - DOSED IN MG/24 HOURS) 14 mg/24hr patch; Place 1 patch (14 mg total) onto the skin daily. -     nicotine (NICODERM CQ - DOSED IN MG/24 HR) 7 mg/24hr patch; Place 1 patch (7 mg total) onto the skin daily.  Dyspepsia, with history of travel throughout world, will test, treat as appropriate, trial of H2 blocker if negative. No red flags.  -  H. pylori Breath Collection  Eating disorder, unspecified type, discussed at length, recommended evaluation with Dr. Gerilyn [name]. No purging behaviors.  Social Stressor, Facilities manager Book given for [employer].   Preventative Care- colposcopy scheduled.   Beth Starr, MD  Family Medicine Teaching Service

## 2018-09-04 ENCOUNTER — Encounter: Payer: Self-pay | Admitting: Family Medicine

## 2018-09-12 ENCOUNTER — Ambulatory Visit (INDEPENDENT_AMBULATORY_CARE_PROVIDER_SITE_OTHER): Payer: BLUE CROSS/BLUE SHIELD | Admitting: Licensed Clinical Social Worker

## 2018-09-12 DIAGNOSIS — F1021 Alcohol dependence, in remission: Secondary | ICD-10-CM | POA: Diagnosis not present

## 2018-09-12 DIAGNOSIS — F4310 Post-traumatic stress disorder, unspecified: Secondary | ICD-10-CM

## 2018-09-12 DIAGNOSIS — F331 Major depressive disorder, recurrent, moderate: Secondary | ICD-10-CM | POA: Diagnosis not present

## 2018-09-13 NOTE — Progress Notes (Signed)
   THERAPIST PROGRESS NOTE  Session Time: 10:30-11:30  Participation Level: Active  Behavioral Response: NeatAlertEuthymic  Type of Therapy: Individual Therapy  Treatment Goals addressed: Diagnosis: Alcohol Use Disorder, Remission  Interventions: Other: Brainspotting  Summary: Beth [name] is a 28 y.o. 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   PT is alert, attentive, somewhat tearful, and works on [employer] an issue about "grieving the loss of cutting her family out of her life".  Suicidal/Homicidal: Nowithout intent/plan  Therapist Response: Counselor used open questions, Brainspotting to which PT responded positively. She identified she feels guilty for cutting our her family but also that it is the right decision since she believes it will help her life her best life and avoid unhealthy people.  Plan: Return again in 2 weeks.  Diagnosis:    ICD-10-CM   1. Post traumatic stress disorder (PTSD) F43.10   2. Alcohol use disorder, severe, in sustained remission (HCC) F10.21   3. MDD (major depressive disorder), recurrent episode, moderate (HCC) F33.1        Margo Common, LCAS-A 09/13/2018

## 2018-09-19 ENCOUNTER — Ambulatory Visit (INDEPENDENT_AMBULATORY_CARE_PROVIDER_SITE_OTHER): Payer: BLUE CROSS/BLUE SHIELD | Admitting: Family Medicine

## 2018-09-19 ENCOUNTER — Other Ambulatory Visit: Payer: Self-pay

## 2018-09-19 VITALS — BP 120/80 | HR 104 | Temp 98.8°F | Wt 149.0 lb

## 2018-09-19 DIAGNOSIS — R87612 Low grade squamous intraepithelial lesion on cytologic smear of cervix (LGSIL): Secondary | ICD-10-CM

## 2018-09-19 DIAGNOSIS — B977 Papillomavirus as the cause of diseases classified elsewhere: Secondary | ICD-10-CM

## 2018-09-19 LAB — POCT URINE PREGNANCY: Preg Test, Ur: NEGATIVE

## 2018-09-20 NOTE — Progress Notes (Signed)
Here for colposcopy. 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 Says she had colposcopy done at that time with 4 vervical biopsies taken )3 listed in chart all CIN-1) and curretage which was negative.  Patient given informed consent, signed copy in the chart.  Placed in lithotomy position. Cervix viewed with speculum and colposcope after application of acetic acid.   Colposcopy adequate (entire squamocolumnar junctions seen  in entirety) ?  yes Acetowhite lesions? none Punctation? none Mosaicism?  none Abnormal vasculature?  no Biopsies? none ECC? no Complications? None  IUD stribgs seen at os and remained in position after colposcopy.  COMMENTS:  Patient was given post procedure instructions.  Repeat pap with cotesting one year given her hx of LGSIL. If pa is normal, can likely transition to schedule for > 30 yoa.

## 2018-09-23 ENCOUNTER — Ambulatory Visit (HOSPITAL_COMMUNITY): Payer: BLUE CROSS/BLUE SHIELD | Admitting: Licensed Clinical Social Worker

## 2018-09-23 NOTE — Progress Notes (Signed)
no show. Called PT and left VM asking for return call.

## 2018-10-03 ENCOUNTER — Ambulatory Visit (HOSPITAL_COMMUNITY): Payer: BLUE CROSS/BLUE SHIELD | Admitting: Psychiatry

## 2018-10-10 ENCOUNTER — Ambulatory Visit (INDEPENDENT_AMBULATORY_CARE_PROVIDER_SITE_OTHER): Payer: BLUE CROSS/BLUE SHIELD | Admitting: Licensed Clinical Social Worker

## 2018-10-10 ENCOUNTER — Ambulatory Visit (HOSPITAL_COMMUNITY): Payer: BLUE CROSS/BLUE SHIELD | Admitting: Licensed Clinical Social Worker

## 2018-10-10 ENCOUNTER — Encounter (HOSPITAL_COMMUNITY): Payer: Self-pay | Admitting: Licensed Clinical Social Worker

## 2018-10-10 DIAGNOSIS — F4310 Post-traumatic stress disorder, unspecified: Secondary | ICD-10-CM | POA: Diagnosis not present

## 2018-10-10 DIAGNOSIS — F1021 Alcohol dependence, in remission: Secondary | ICD-10-CM

## 2018-10-10 DIAGNOSIS — F331 Major depressive disorder, recurrent, moderate: Secondary | ICD-10-CM

## 2018-10-10 NOTE — Progress Notes (Signed)
   THERAPIST PROGRESS NOTE  Session Time: 10:30-11:30  Participation Level: Active  Behavioral Response: Fairly GroomedAlertDepressed  Type of Therapy: Individual Therapy  Treatment Goals addressed: Anxiety and Diagnosis: MDD  Interventions: CBT and Supportive  Summary: Beth [name] is a 28 y.o. female who presents with hx of PTSD, MDD, and Alcohol Use Disorder, Severe currently in remission. She is agitated, w/ blunted affect, and lucid thinking. Reports she is tired, sleeping often, now workign 2 jobs to help support herself. She reports she is feeling suicidal. Convinceingly denies having a plan or intent. She states she feels more self sabotaging. She is avoiding sharing at meetings, feeling "restless irritable and discontent" as [employer] Book states. PT is presented w/ CBT "Unhelpful Thinking Styles" worksheet and asked which ones she is doing. PT agrees she is catastrophizing and mental filtering. PT is engaged in solution-focused interventions aimed at reminding her of times she has been "in crisis" in the past. Psychoeducation is provided on IOP level of care. Ultimately, PT decides she needs to "go to more meetings, try new meetings, and focus on herself and possibly break up w/ her boyfriend". Counselor encourages PT to make next appointment in one week.   Suicidal/Homicidal: Yeswithout intent/plan  Therapist Response: Counselor used open questions, active listening, suicide assessment, psychoeducation, and CBT examination of cognitions. A worksheet was provided on unhealthy thinking styles.   Plan: Return again in 1 week.  Diagnosis:    ICD-10-CM   1. Post traumatic stress disorder (PTSD) F43.10   2. MDD (major depressive disorder), recurrent episode, moderate (HCC) F33.1   3. Alcohol use disorder, severe, in sustained remission [facility]) F10.21        Margo Common, LCAS-A 10/10/2018

## 2018-10-24 ENCOUNTER — Ambulatory Visit (HOSPITAL_COMMUNITY): Payer: BLUE CROSS/BLUE SHIELD | Admitting: Licensed Clinical Social Worker

## 2018-10-25 ENCOUNTER — Other Ambulatory Visit: Payer: Self-pay

## 2018-10-25 ENCOUNTER — Ambulatory Visit (INDEPENDENT_AMBULATORY_CARE_PROVIDER_SITE_OTHER): Payer: BLUE CROSS/BLUE SHIELD | Admitting: Psychiatry

## 2018-10-25 VITALS — BP 110/70 | Ht 61.5 in | Wt 152.0 lb

## 2018-10-25 DIAGNOSIS — F1021 Alcohol dependence, in remission: Secondary | ICD-10-CM | POA: Diagnosis not present

## 2018-10-25 DIAGNOSIS — Z8659 Personal history of other mental and behavioral disorders: Secondary | ICD-10-CM | POA: Diagnosis not present

## 2018-10-25 DIAGNOSIS — F333 Major depressive disorder, recurrent, severe with psychotic symptoms: Secondary | ICD-10-CM

## 2018-10-25 DIAGNOSIS — F4310 Post-traumatic stress disorder, unspecified: Secondary | ICD-10-CM | POA: Diagnosis not present

## 2018-10-25 MED ORDER — FLUOXETINE HCL 40 MG PO CAPS: 40.0000 mg | ORAL_CAPSULE | Freq: Every day | ORAL | 0 refills | Status: DC

## 2018-10-25 MED ORDER — TRAZODONE HCL 100 MG PO TABS: 200.0000 mg | ORAL_TABLET | Freq: Every day | ORAL | 0 refills | Status: DC

## 2018-10-25 MED ORDER — NORTRIPTYLINE HCL 25 MG PO CAPS: 25.0000 mg | ORAL_CAPSULE | Freq: Every day | ORAL | 0 refills | Status: DC

## 2018-10-25 MED ORDER — LITHIUM CARBONATE ER 300 MG PO TBCR: EXTENDED_RELEASE_TABLET | ORAL | 0 refills | Status: DC
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
## 2018-10-28 ENCOUNTER — Ambulatory Visit (HOSPITAL_COMMUNITY): Payer: BLUE CROSS/BLUE SHIELD | Admitting: Licensed Clinical Social Worker

## 2018-11-26 ENCOUNTER — Ambulatory Visit (HOSPITAL_COMMUNITY): Payer: BLUE CROSS/BLUE SHIELD | Admitting: Psychiatry

## 2018-12-02 ENCOUNTER — Other Ambulatory Visit (HOSPITAL_COMMUNITY): Payer: Self-pay

## 2018-12-02 ENCOUNTER — Telehealth (HOSPITAL_COMMUNITY): Payer: Self-pay

## 2018-12-02 DIAGNOSIS — F4310 Post-traumatic stress disorder, unspecified: Secondary | ICD-10-CM

## 2018-12-02 DIAGNOSIS — F1021 Alcohol dependence, in remission: Secondary | ICD-10-CM

## 2018-12-02 MED ORDER — NORTRIPTYLINE HCL 25 MG PO CAPS: 25.0000 mg | ORAL_CAPSULE | Freq: Every day | ORAL | 0 refills | Status: DC

## 2018-12-02 NOTE — Telephone Encounter (Signed)
Medication refill request - Fax from patient's [facility] requesting a new Lithium Carbonate order, last provdied 10/25/18 with no refills. Patient cancelled appt 11/26/18 and has not rescheduled as of this date. Patient last seen 10/25/18.

## 2018-12-03 MED ORDER — LITHIUM CARBONATE ER 300 MG PO TBCR: EXTENDED_RELEASE_TABLET | ORAL | 0 refills | Status: DC

## 2018-12-03 NOTE — Telephone Encounter (Signed)
Medication management - Message left for patient that Dr. Lolly Mustache sent in a 15 day supply of her Lithium but that patient needed to come in within the next week for a lithium level and to also schedule a new follow-up appt with Dr. Lolly Mustache.  Informed patient on message she would need to have the lab work completed and time for Dr. Lolly Mustache to review the results before he would send in any further refills.  Requested patient call back as soon as possible to set up a time for the labs to be drawn and to set up a follow-up video conference with Dr. Lolly Mustache.

## 2018-12-03 NOTE — Telephone Encounter (Signed)
She is supposed to come back in 1 week for lithium level.  I have provided only 2-week supply of lithium at [facility] [place]. But cannot renew further  until she get the lithium level and she needs to be seen for future refills.

## 2018-12-10 ENCOUNTER — Other Ambulatory Visit (HOSPITAL_COMMUNITY): Payer: Self-pay

## 2018-12-10 ENCOUNTER — Ambulatory Visit (HOSPITAL_COMMUNITY): Payer: BLUE CROSS/BLUE SHIELD | Admitting: Psychiatry

## 2018-12-10 DIAGNOSIS — F333 Major depressive disorder, recurrent, severe with psychotic symptoms: Secondary | ICD-10-CM

## 2018-12-10 DIAGNOSIS — F4310 Post-traumatic stress disorder, unspecified: Secondary | ICD-10-CM

## 2018-12-10 DIAGNOSIS — F1021 Alcohol dependence, in remission: Secondary | ICD-10-CM

## 2018-12-10 MED ORDER — TRAZODONE HCL 100 MG PO TABS: 200.0000 mg | ORAL_TABLET | Freq: Every day | ORAL | 0 refills | Status: DC

## 2018-12-10 MED ORDER — FLUOXETINE HCL 40 MG PO CAPS: 40.0000 mg | ORAL_CAPSULE | Freq: Every day | ORAL | 0 refills | Status: DC

## 2018-12-19 ENCOUNTER — Inpatient Hospital Stay (HOSPITAL_COMMUNITY)
Admission: AD | Admit: 2018-12-19 | Discharge: 2018-12-22 | DRG: 885 | Disposition: A | Discharge status: Home / Self Care | Payer: BLUE CROSS/BLUE SHIELD | Attending: Psychiatry | Admitting: Psychiatry

## 2018-12-19 ENCOUNTER — Encounter (HOSPITAL_COMMUNITY): Payer: Self-pay | Admitting: *Deleted

## 2018-12-19 ENCOUNTER — Emergency Department (HOSPITAL_COMMUNITY)
Admission: EM | Admit: 2018-12-19 | Discharge: 2018-12-19 | Disposition: A | Discharge status: Other Acute Inpatient Hospital | Payer: BLUE CROSS/BLUE SHIELD | Source: Home / Self Care | Attending: Emergency Medicine | Admitting: Emergency Medicine

## 2018-12-19 ENCOUNTER — Other Ambulatory Visit: Payer: Self-pay

## 2018-12-19 ENCOUNTER — Telehealth (INDEPENDENT_AMBULATORY_CARE_PROVIDER_SITE_OTHER): Payer: BLUE CROSS/BLUE SHIELD | Admitting: Student in an Organized Health Care Education/Training Program

## 2018-12-19 ENCOUNTER — Encounter: Payer: Self-pay | Admitting: Student in an Organized Health Care Education/Training Program

## 2018-12-19 ENCOUNTER — Ambulatory Visit (INDEPENDENT_AMBULATORY_CARE_PROVIDER_SITE_OTHER): Payer: BLUE CROSS/BLUE SHIELD | Admitting: Psychiatry

## 2018-12-19 ENCOUNTER — Telehealth: Payer: Self-pay | Admitting: *Deleted

## 2018-12-19 DIAGNOSIS — F1220 Cannabis dependence, uncomplicated: Secondary | ICD-10-CM | POA: Diagnosis not present

## 2018-12-19 DIAGNOSIS — G4700 Insomnia, unspecified: Secondary | ICD-10-CM | POA: Diagnosis present

## 2018-12-19 DIAGNOSIS — R000 Tachycardia, unspecified: Secondary | ICD-10-CM | POA: Diagnosis present

## 2018-12-19 DIAGNOSIS — Y900 Blood alcohol level of less than 20 mg/100 ml: Secondary | ICD-10-CM | POA: Diagnosis present

## 2018-12-19 DIAGNOSIS — F333 Major depressive disorder, recurrent, severe with psychotic symptoms: Secondary | ICD-10-CM

## 2018-12-19 DIAGNOSIS — Z20828 Contact with and (suspected) exposure to other viral communicable diseases: Secondary | ICD-10-CM | POA: Diagnosis not present

## 2018-12-19 DIAGNOSIS — F4310 Post-traumatic stress disorder, unspecified: Secondary | ICD-10-CM | POA: Diagnosis present

## 2018-12-19 DIAGNOSIS — F1020 Alcohol dependence, uncomplicated: Secondary | ICD-10-CM | POA: Diagnosis not present

## 2018-12-19 DIAGNOSIS — F1021 Alcohol dependence, in remission: Secondary | ICD-10-CM | POA: Diagnosis not present

## 2018-12-19 DIAGNOSIS — T1491XA Suicide attempt, initial encounter: Secondary | ICD-10-CM

## 2018-12-19 DIAGNOSIS — I10 Essential (primary) hypertension: Secondary | ICD-10-CM | POA: Diagnosis not present

## 2018-12-19 DIAGNOSIS — Z03818 Encounter for observation for suspected exposure to other biological agents ruled out: Secondary | ICD-10-CM | POA: Diagnosis not present

## 2018-12-19 DIAGNOSIS — Z1159 Encounter for screening for other viral diseases: Secondary | ICD-10-CM

## 2018-12-19 DIAGNOSIS — F17210 Nicotine dependence, cigarettes, uncomplicated: Secondary | ICD-10-CM | POA: Insufficient documentation

## 2018-12-19 DIAGNOSIS — F332 Major depressive disorder, recurrent severe without psychotic features: Secondary | ICD-10-CM

## 2018-12-19 DIAGNOSIS — R45851 Suicidal ideations: Secondary | ICD-10-CM | POA: Diagnosis not present

## 2018-12-19 DIAGNOSIS — Z9119 Patient's noncompliance with other medical treatment and regimen: Secondary | ICD-10-CM | POA: Diagnosis not present

## 2018-12-19 DIAGNOSIS — Z23 Encounter for immunization: Secondary | ICD-10-CM | POA: Diagnosis not present

## 2018-12-19 DIAGNOSIS — Z79899 Other long term (current) drug therapy: Secondary | ICD-10-CM | POA: Insufficient documentation

## 2018-12-19 DIAGNOSIS — F329 Major depressive disorder, single episode, unspecified: Secondary | ICD-10-CM | POA: Diagnosis not present

## 2018-12-19 LAB — CBC WITH DIFFERENTIAL/PLATELET
Abs Immature Granulocytes: 0.04 10*3/uL (ref 0.00–0.07)
Basophils Absolute: 0 10*3/uL (ref 0.0–0.1)
Basophils Relative: 0 %
Eosinophils Absolute: 0.3 10*3/uL (ref 0.0–0.5)
Eosinophils Relative: 3 %
HCT: 36.4 % (ref 36.0–46.0)
Hemoglobin: 11.9 g/dL — ABNORMAL LOW (ref 12.0–15.0)
Immature Granulocytes: 0 %
Lymphocytes Relative: 15 %
Lymphs Abs: 1.6 10*3/uL (ref 0.7–4.0)
MCH: 31.3 pg (ref 26.0–34.0)
MCHC: 32.7 g/dL (ref 30.0–36.0)
MCV: 95.8 fL (ref 80.0–100.0)
Monocytes Absolute: 0.6 10*3/uL (ref 0.1–1.0)
Monocytes Relative: 6 %
Neutro Abs: 7.6 10*3/uL (ref 1.7–7.7)
Neutrophils Relative %: 76 %
Platelets: 279 10*3/uL (ref 150–400)
RBC: 3.8 MIL/uL — ABNORMAL LOW (ref 3.87–5.11)
RDW: 13.5 % (ref 11.5–15.5)
WBC: 10.2 10*3/uL (ref 4.0–10.5)
nRBC: 0 % (ref 0.0–0.2)

## 2018-12-19 LAB — RAPID URINE DRUG SCREEN, HOSP PERFORMED
Amphetamines: NOT DETECTED
Barbiturates: NOT DETECTED
Benzodiazepines: NOT DETECTED
Cocaine: NOT DETECTED
Opiates: NOT DETECTED
Tetrahydrocannabinol: POSITIVE — AB

## 2018-12-19 LAB — COMPREHENSIVE METABOLIC PANEL
ALT: 23 U/L (ref 0–44)
AST: 22 U/L (ref 15–41)
Albumin: 3.6 g/dL (ref 3.5–5.0)
Alkaline Phosphatase: 76 U/L (ref 38–126)
Anion gap: 8 (ref 5–15)
BUN: 6 mg/dL (ref 6–20)
CO2: 19 mmol/L — ABNORMAL LOW (ref 22–32)
Calcium: 8.8 mg/dL — ABNORMAL LOW (ref 8.9–10.3)
Chloride: 108 mmol/L (ref 98–111)
Creatinine, Ser: 0.54 mg/dL (ref 0.44–1.00)
GFR calc Af Amer: >60 mL/min (ref >60)
GFR calc non Af Amer: >60 mL/min (ref >60)
Glucose, Bld: 102 mg/dL — ABNORMAL HIGH (ref 70–99)
Potassium: 4 mmol/L (ref 3.5–5.1)
Sodium: 135 mmol/L (ref 135–145)
Total Bilirubin: 0.3 mg/dL (ref 0.3–1.2)
Total Protein: 6.4 g/dL — ABNORMAL LOW (ref 6.5–8.1)

## 2018-12-19 LAB — ETHANOL: Alcohol, Ethyl (B): <10 mg/dL (ref <10)

## 2018-12-19 LAB — ACETAMINOPHEN LEVEL: Acetaminophen (Tylenol), Serum: <10 ug/mL — ABNORMAL LOW (ref 10–30)

## 2018-12-19 LAB — LITHIUM LEVEL: Lithium Lvl: 0.27 mmol/L — ABNORMAL LOW (ref 0.60–1.20)

## 2018-12-19 LAB — SARS CORONAVIRUS 2 BY RT PCR (HOSPITAL ORDER, PERFORMED IN ~~LOC~~ HOSPITAL LAB): SARS Coronavirus 2: NEGATIVE

## 2018-12-19 LAB — SALICYLATE LEVEL: Salicylate Lvl: <7 mg/dL (ref 2.8–30.0)

## 2018-12-19 MED ORDER — LORAZEPAM 2 MG/ML IJ SOLN: 0.0000 mg | Freq: Four times a day (QID) | INTRAMUSCULAR | Status: DC

## 2018-12-19 MED ORDER — LORAZEPAM 1 MG PO TABS: 0.0000 mg | ORAL_TABLET | Freq: Four times a day (QID) | ORAL | Status: DC

## 2018-12-19 MED ORDER — PRAZOSIN HCL 1 MG PO CAPS
1.0000 mg | ORAL_CAPSULE | Freq: Every day | ORAL | Status: DC
  Administered 2018-12-19 – 2018-12-21 (×3): 1 mg via ORAL
  Filled 2018-12-19 (×6): qty 1

## 2018-12-19 MED ORDER — ONDANSETRON 4 MG PO TBDP: 4.0000 mg | ORAL_TABLET | Freq: Four times a day (QID) | ORAL | Status: DC | PRN

## 2018-12-19 MED ORDER — ACETAMINOPHEN 325 MG PO TABS
650.0000 mg | ORAL_TABLET | Freq: Four times a day (QID) | ORAL | Status: DC | PRN
  Administered 2018-12-21: 20:00:00 650 mg via ORAL
  Filled 2018-12-19: qty 2

## 2018-12-19 MED ORDER — THIAMINE HCL 100 MG/ML IJ SOLN: 100.0000 mg | Freq: Every day | INTRAMUSCULAR | Status: DC

## 2018-12-19 MED ORDER — LORAZEPAM 1 MG PO TABS: 0.0000 mg | ORAL_TABLET | Freq: Two times a day (BID) | ORAL | Status: DC

## 2018-12-19 MED ORDER — LORAZEPAM 1 MG PO TABS: 1.0000 mg | ORAL_TABLET | Freq: Every day | ORAL | Status: DC

## 2018-12-19 MED ORDER — ALUM & MAG HYDROXIDE-SIMETH 200-200-20 MG/5ML PO SUSP: 30.0000 mL | ORAL | Status: DC | PRN

## 2018-12-19 MED ORDER — ONDANSETRON HCL 4 MG PO TABS: 4.0000 mg | ORAL_TABLET | Freq: Three times a day (TID) | ORAL | Status: DC | PRN

## 2018-12-19 MED ORDER — LORAZEPAM 1 MG PO TABS
1.0000 mg | ORAL_TABLET | Freq: Four times a day (QID) | ORAL | Status: AC
  Administered 2018-12-19 – 2018-12-21 (×6): 1 mg via ORAL
  Filled 2018-12-19 (×6): qty 1

## 2018-12-19 MED ORDER — TRAZODONE HCL 50 MG PO TABS
50.0000 mg | ORAL_TABLET | Freq: Every evening | ORAL | Status: DC | PRN
  Administered 2018-12-19 – 2018-12-21 (×4): 50 mg via ORAL
  Filled 2018-12-19 (×10): qty 1

## 2018-12-19 MED ORDER — ACETAMINOPHEN 325 MG PO TABS: 650.0000 mg | ORAL_TABLET | ORAL | Status: DC | PRN

## 2018-12-19 MED ORDER — LORAZEPAM 1 MG PO TABS
1.0000 mg | ORAL_TABLET | Freq: Three times a day (TID) | ORAL | Status: AC
  Administered 2018-12-21 – 2018-12-22 (×2): 1 mg via ORAL
  Filled 2018-12-19 (×2): qty 1

## 2018-12-19 MED ORDER — ALUM & MAG HYDROXIDE-SIMETH 200-200-20 MG/5ML PO SUSP: 30.0000 mL | Freq: Four times a day (QID) | ORAL | Status: DC | PRN

## 2018-12-19 MED ORDER — LOPERAMIDE HCL 2 MG PO CAPS: 2.0000 mg | ORAL_CAPSULE | ORAL | Status: DC | PRN

## 2018-12-19 MED ORDER — ADULT MULTIVITAMIN W/MINERALS CH
1.0000 | ORAL_TABLET | Freq: Every day | ORAL | Status: DC
  Administered 2018-12-20 – 2018-12-22 (×3): 1 via ORAL
  Filled 2018-12-19 (×5): qty 1

## 2018-12-19 MED ORDER — LORAZEPAM 2 MG/ML IJ SOLN: 0.0000 mg | Freq: Two times a day (BID) | INTRAMUSCULAR | Status: DC

## 2018-12-19 MED ORDER — THIAMINE HCL 100 MG/ML IJ SOLN: 100.0000 mg | Freq: Once | INTRAMUSCULAR | Status: DC

## 2018-12-19 MED ORDER — NICOTINE 7 MG/24HR TD PT24: 7.0000 mg | MEDICATED_PATCH | Freq: Once | TRANSDERMAL | Status: DC

## 2018-12-19 MED ORDER — VITAMIN B-1 100 MG PO TABS
100.0000 mg | ORAL_TABLET | Freq: Every day | ORAL | Status: DC
  Administered 2018-12-19: 100 mg via ORAL
  Filled 2018-12-19: qty 1

## 2018-12-19 MED ORDER — HYDROXYZINE HCL 25 MG PO TABS
25.0000 mg | ORAL_TABLET | Freq: Four times a day (QID) | ORAL | Status: DC | PRN
  Administered 2018-12-19 – 2018-12-21 (×3): 25 mg via ORAL
  Filled 2018-12-19 (×4): qty 1

## 2018-12-19 MED ORDER — LORAZEPAM 1 MG PO TABS: 1.0000 mg | ORAL_TABLET | Freq: Two times a day (BID) | ORAL | Status: DC

## 2018-12-19 MED ORDER — MAGNESIUM HYDROXIDE 400 MG/5ML PO SUSP: 30.0000 mL | Freq: Every day | ORAL | Status: DC | PRN

## 2018-12-19 MED ORDER — TRAZODONE HCL 50 MG PO TABS
50.0000 mg | ORAL_TABLET | Freq: Every evening | ORAL | Status: DC | PRN
  Filled 2018-12-19: qty 1

## 2018-12-19 MED ORDER — LORAZEPAM 1 MG PO TABS: 1.0000 mg | ORAL_TABLET | Freq: Four times a day (QID) | ORAL | Status: DC | PRN

## 2018-12-19 MED ORDER — VITAMIN B-1 100 MG PO TABS
100.0000 mg | ORAL_TABLET | Freq: Every day | ORAL | Status: DC
  Administered 2018-12-20 – 2018-12-22 (×3): 100 mg via ORAL
  Filled 2018-12-19 (×5): qty 1

## 2018-12-19 NOTE — Telephone Encounter (Signed)
2nd attempt to reach pt prior to my chart video appointment.  LVM to call office back.  Will inform MD to try and connect at the appropriate time if I don't get to speak to her.April Zimmerman Rumple, CMA

## 2018-12-19 NOTE — ED Notes (Signed)
Pt A&O x 3, no distress noted, calm & cooperative, sad affect.  Monitoring for safety, Sitter at bedside.  Pending TTS.

## 2018-12-19 NOTE — ED Notes (Signed)
Telepsych by Penni Bombard in progress at present.
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
## 2018-12-19 NOTE — Progress Notes (Signed)
Virtual Visit via Telephone Note  I connected with Beth [name] on 12/19/18 at  4:00 PM EDT by telephone and verified that I am speaking with the correct person using two identifiers.   I discussed the limitations, risks, security and privacy concerns of performing an evaluation and management service by telephone and the availability of in person appointments. I also discussed with the patient that there may be a patient responsible charge related to this service. The patient expressed understanding and agreed to proceed.   History of Present Illness: Patient evaluated through phone.  She admitted increased depression and relapse into drinking for past 4 weeks.  She is not taking the medication as prescribed.  We have recommended to do lithium level but she did not came for the lithium level.  On her last visit we increase lithium but again she is not consistent with the medication.  Today she cut her leg superficially with a knife and she has suicidal thoughts to cut deeper to take her life.  Earlier she had a virtual visit with her physician who recommended to get help.  I agree that she need to be hospitalized for stabilization.  Patient denies any hallucination, paranoia but she is noncompliant with medication, cutting herself and drinking alcohol.  I recommended inpatient and patient agree.  Patient will come with her partner for assessment and inpatient treatment.  I called assessment department at [phone] and spoke to Maralyn Sago and explained the circumstances.   Observations/Objective: Mental status attention done on the phone.  Patient described her mood sad and depressed.  Her speech is slow.  She endorsed active suicidal thoughts and plan to take her life by cutting herself.  She actually cut superficially on her leg.  She denies any hallucination.  She is alert and oriented x3.  Her insight and judgment is limited.  Assessment and Plan: Major depressive disorder, recurrent severe.   Alcohol dependence.  Posttraumatic stress disorder.  Recommended inpatient services.  Patient agreed with the plan and she talked to her partner who will bring her to behavioral health center for assessment and inpatient treatment.  Information was provided to assessment department.  Once she is stabilized she will follow-up on outpatient.  Follow Up Instructions:    I discussed the assessment and treatment plan with the patient. The patient was provided an opportunity to ask questions and all were answered. The patient agreed with the plan and demonstrated an understanding of the instructions.   The patient was advised to call back or seek an in-person evaluation if the symptoms worsen or if the condition fails to improve as anticipated.  I provided 20 minutes of non-face-to-face time during this encounter.   Cleotis Nipper, MD
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
## 2018-12-19 NOTE — ED Notes (Signed)
Pt in room 29 at 1740. Pt oriented to room. PA assessed pt. Pt calm, cooperative. Endorses cutting self in legs. Pt denies SI currently. Pt accepting of help. Pt endorses being sad and alcoholic with a relapse. Pt denied HI, A/V hallucinations. Pt resting in bed comfortably.

## 2018-12-19 NOTE — Telephone Encounter (Signed)
LVM for pt to call office back to do her pre-charting prior to her My Chart visit.  If she calls back please put her through to white team so we can get this information. Beth [name], CMA

## 2018-12-19 NOTE — ED Notes (Signed)
Patient wanded by security. 

## 2018-12-19 NOTE — ED Notes (Signed)
Report called to RN [place], [facility] rm [id number].  Pending [place] transport.

## 2018-12-20 ENCOUNTER — Other Ambulatory Visit: Payer: Self-pay

## 2018-12-20 DIAGNOSIS — F332 Major depressive disorder, recurrent severe without psychotic features: Principal | ICD-10-CM

## 2018-12-20 LAB — I-STAT BETA HCG BLOOD, ED (MC, WL, AP ONLY): I-stat hCG, quantitative: <5 m[IU]/mL (ref <5)

## 2018-12-20 MED ORDER — PNEUMOCOCCAL VAC POLYVALENT 25 MCG/0.5ML IJ INJ
0.5000 mL | INJECTION | INTRAMUSCULAR | Status: AC
  Administered 2018-12-22: 14:00:00 0.5 mL via INTRAMUSCULAR

## 2018-12-20 MED ORDER — FLUOXETINE HCL 20 MG PO CAPS
40.0000 mg | ORAL_CAPSULE | Freq: Every day | ORAL | Status: DC
  Administered 2018-12-20 – 2018-12-22 (×3): 40 mg via ORAL
  Filled 2018-12-20 (×6): qty 2

## 2018-12-20 MED ORDER — LITHIUM CARBONATE ER 300 MG PO TBCR
600.0000 mg | EXTENDED_RELEASE_TABLET | Freq: Every day | ORAL | Status: DC
  Administered 2018-12-20 – 2018-12-21 (×2): 600 mg via ORAL
  Filled 2018-12-20 (×4): qty 2

## 2018-12-20 MED ORDER — LITHIUM CARBONATE ER 300 MG PO TBCR
300.0000 mg | EXTENDED_RELEASE_TABLET | Freq: Every day | ORAL | Status: DC
  Administered 2018-12-20 – 2018-12-22 (×3): 300 mg via ORAL
  Filled 2018-12-20 (×6): qty 1

## 2018-12-20 MED ORDER — NICOTINE 21 MG/24HR TD PT24
21.0000 mg | MEDICATED_PATCH | Freq: Every day | TRANSDERMAL | Status: DC
  Administered 2018-12-20 – 2018-12-22 (×3): 21 mg via TRANSDERMAL
  Filled 2018-12-20 (×5): qty 1

## 2018-12-20 NOTE — Assessment & Plan Note (Signed)
Counseled patient on safety. Did not feel comfortable with her being alone. She agreed. Referred patient to go to [facility] admission. She agreed. Patient did present to [facility] a couple hours later.

## 2018-12-20 NOTE — Tx Team (Signed)
Interdisciplinary Treatment and Diagnostic Plan Update  12/20/2018 Time of Session: 12/20/2018; 10:10am Beth [name] MRN: [id number]  Principal Diagnosis: <principal problem not specified>  Secondary Diagnoses: Active Problems:   MDD (major depressive disorder), recurrent episode, severe (HCC)   Current Medications:  Current Facility-Administered Medications  Medication Dose Route Frequency Provider Last Rate Last Dose  . acetaminophen (TYLENOL) tablet 650 mg  650 mg Oral Q6H PRN Laverle Hobby, PA-C      . alum & mag hydroxide-simeth (MAALOX/MYLANTA) 200-200-20 MG/5ML suspension 30 mL  30 mL Oral Q4H PRN Laverle Hobby, PA-C      . FLUoxetine (PROZAC) capsule 40 mg  40 mg Oral Daily Johnn Hai, MD      . hydrOXYzine (ATARAX/VISTARIL) tablet 25 mg  25 mg Oral Q6H PRN Laverle Hobby, PA-C   25 mg at 12/19/18 2355  . lithium carbonate (LITHOBID) CR tablet 300 mg  300 mg Oral Daily Johnn Hai, MD      . lithium carbonate (LITHOBID) CR tablet 600 mg  600 mg Oral QHS Johnn Hai, MD      . loperamide (IMODIUM) capsule 2-4 mg  2-4 mg Oral PRN Laverle Hobby, PA-C      . LORazepam (ATIVAN) tablet 1 mg  1 mg Oral Q6H PRN Laverle Hobby, PA-C      . LORazepam (ATIVAN) tablet 1 mg  1 mg Oral QID Patriciaann Clan E, PA-C   1 mg at 12/20/18 5625   Followed by  . [START ON 12/21/2018] LORazepam (ATIVAN) tablet 1 mg  1 mg Oral TID Laverle Hobby, PA-C       Followed by  . [START ON 12/22/2018] LORazepam (ATIVAN) tablet 1 mg  1 mg Oral BID Patriciaann Clan E, PA-C       Followed by  . [START ON 12/24/2018] LORazepam (ATIVAN) tablet 1 mg  1 mg Oral Daily Simon, Spencer E, PA-C      . magnesium hydroxide (MILK OF MAGNESIA) suspension 30 mL  30 mL Oral Daily PRN Laverle Hobby, PA-C      . multivitamin with minerals tablet 1 tablet  1 tablet Oral Daily Laverle Hobby, PA-C   1 tablet at 12/20/18 [phone]  . nicotine (NICODERM CQ - dosed in mg/24 hours) patch 21 mg  21 mg Transdermal Daily  Sharma Covert, MD   21 mg at 12/20/18 3734  . ondansetron (ZOFRAN-ODT) disintegrating tablet 4 mg  4 mg Oral Q6H PRN Laverle Hobby, PA-C      . [START ON 12/21/2018] pneumococcal 23 valent vaccine (PNU-IMMUNE) injection 0.5 mL  0.5 mL Intramuscular Tomorrow-1000 Sharma Covert, MD      . prazosin (MINIPRESS) capsule 1 mg  1 mg Oral QHS Patriciaann Clan E, PA-C   1 mg at 12/19/18 2355  . thiamine (B-1) injection 100 mg  100 mg Intramuscular Once Patriciaann Clan E, PA-C      . thiamine (VITAMIN B-1) tablet 100 mg  100 mg Oral Daily Patriciaann Clan E, PA-C   100 mg at 12/20/18 0915  . traZODone (DESYREL) tablet 50 mg  50 mg Oral QHS,MR X 1 Laverle Hobby, PA-C   50 mg at 12/19/18 2355   PTA Medications: Medications Prior to Admission  Medication Sig Dispense Refill Last Dose  . FLUoxetine (PROZAC) 40 MG capsule Take 1 capsule (40 mg total) by mouth daily. 30 capsule 0 12/18/2018 at Unknown time  . hydrOXYzine (ATARAX/VISTARIL) 50 MG tablet Take 1  tablet (50 mg total) by mouth every 6 (six) hours as needed for anxiety. 90 tablet 1 12/18/2018 at Unknown time  . levonorgestrel (MIRENA) 20 MCG/24HR IUD 1 each by Intrauterine route once.   Taking  . lithium carbonate (LITHOBID) 300 MG CR tablet Take one tab in AM and two tab at bed time (Patient taking differently: Take 900 mg by mouth at bedtime. ) 45 tablet 0 12/18/2018 at Unknown time  . nortriptyline (PAMELOR) 25 MG capsule Take 1 capsule (25 mg total) by mouth at bedtime for 30 days. 30 capsule 0 12/18/2018 at Unknown time  . traZODone (DESYREL) 100 MG tablet Take 2 tablets (200 mg total) by mouth at bedtime. 60 tablet 0 12/18/2018 at Unknown time    Patient Stressors: Medication change or noncompliance Substance abuse  Patient Strengths: Ability for insight Average or above average intelligence Capable of independent living Occupational psychologist fund of knowledge Motivation for treatment/growth Physical  Health Supportive family/friends Work skills  Treatment Modalities: Medication Management, Group therapy, Case management,  1 to 1 session with clinician, Psychoeducation, Recreational therapy.   Physician Treatment Plan for Primary Diagnosis: <principal problem not specified> Long Term Goal(s): Improvement in symptoms so as ready for discharge Improvement in symptoms so as ready for discharge   Short Term Goals: Ability to disclose and discuss suicidal ideas Ability to identify and develop effective coping behaviors will improve Ability to demonstrate self-control will improve Ability to identify and develop effective coping behaviors will improve  Medication Management: Evaluate patient's response, side effects, and tolerance of medication regimen.  Therapeutic Interventions: 1 to 1 sessions, Unit Group sessions and Medication administration.  Evaluation of Outcomes: Not Met  Physician Treatment Plan for Secondary Diagnosis: Active Problems:   MDD (major depressive disorder), recurrent episode, severe ([place])  Long Term Goal(s): Improvement in symptoms so as ready for discharge Improvement in symptoms so as ready for discharge   Short Term Goals: Ability to disclose and discuss suicidal ideas Ability to identify and develop effective coping behaviors will improve Ability to demonstrate self-control will improve Ability to identify and develop effective coping behaviors will improve     Medication Management: Evaluate patient's response, side effects, and tolerance of medication regimen.  Therapeutic Interventions: 1 to 1 sessions, Unit Group sessions and Medication administration.  Evaluation of Outcomes: Not Met   RN Treatment Plan for Primary Diagnosis: <principal problem not specified> Long Term Goal(s): Knowledge of disease and therapeutic regimen to maintain health will improve  Short Term Goals: Ability to participate in decision making will improve, Ability to  verbalize feelings will improve, Ability to disclose and discuss suicidal ideas, Ability to identify and develop effective coping behaviors will improve and Compliance with prescribed medications will improve  Medication Management: RN will administer medications as ordered by provider, will assess and evaluate patient's response and provide education to patient for prescribed medication. RN will report any adverse and/or side effects to prescribing provider.  Therapeutic Interventions: 1 on 1 counseling sessions, Psychoeducation, Medication administration, Evaluate responses to treatment, Monitor vital signs and CBGs as ordered, Perform/monitor CIWA, COWS, AIMS and Fall Risk screenings as ordered, Perform wound care treatments as ordered.  Evaluation of Outcomes: Not Met   LCSW Treatment Plan for Primary Diagnosis: <principal problem not specified> Long Term Goal(s): Safe transition to appropriate next level of care at discharge, Engage patient in therapeutic group addressing interpersonal concerns.  Short Term Goals: Engage patient in aftercare planning with referrals and resources and Increase skills  for wellness and recovery  Therapeutic Interventions: Assess for all discharge needs, 1 to 1 time with Social worker, Explore available resources and support systems, Assess for adequacy in community support network, Educate family and significant other(s) on suicide prevention, Complete Psychosocial Assessment, Interpersonal group therapy.  Evaluation of Outcomes: Not Met   Progress in Treatment: Attending groups: Yes. Participating in groups: Yes. Taking medication as prescribed: Yes. Toleration medication: Yes. Family/Significant other contact made: No, will contact:  will contact (partner, mom, dad, or friend) Patient understands diagnosis: Yes. Discussing patient identified problems/goals with staff: Yes. Medical problems stabilized or resolved: Yes. Denies suicidal/homicidal  ideation: Yes. Issues/concerns per patient self-inventory: Yes. Other:   New problem(s) identified: No, Describe:  None  New Short Term/Long Term Goal(s):  Patient Goals:  "To not drink"  Discharge Plan or Barriers:   Reason for Continuation of Hospitalization: Anxiety Depression Medication stabilization  Estimated Length of Stay: 3-5 days   Attendees: Patient: 12/20/2018   Physician: Dr. Johnn Hai, MD 12/20/2018   Nursing: Carlis Abbott, RN 12/20/2018   RN Care Manager: 12/20/2018   Social Worker: Ardelle Anton, LCSW 12/20/2018   Recreational Therapist:  12/20/2018   Other:  12/20/2018   Other:  12/20/2018   Other: 12/20/2018       Scribe for Treatment Team: Trecia Rogers, LCSW 12/20/2018 11:07 AM

## 2018-12-20 NOTE — Tx Team (Signed)
Initial Treatment Plan 12/19/2018 2350 Beth [name] QVZ:[id number]    PATIENT STRESSORS: Medication change or noncompliance Substance abuse   PATIENT STRENGTHS: Ability for insight Average or above average intelligence Capable of independent living Communication skills Financial means General fund of knowledge Motivation for treatment/growth Physical Health Supportive family/friends Work skills   PATIENT IDENTIFIED PROBLEMS:   "To stop drinking."    "To believe things can get better."               DISCHARGE CRITERIA:  Improved stabilization in mood, thinking, and/or behavior Need for constant or close observation no longer present Reduction of life-threatening or endangering symptoms to within safe limits Verbal commitment to aftercare and medication compliance Withdrawal symptoms are absent or subacute and managed without 24-hour nursing intervention  PRELIMINARY DISCHARGE PLAN: Attend 12-step recovery group Outpatient therapy Return to previous living arrangement Return to previous work or school arrangements  PATIENT/FAMILY INVOLVEMENT: This treatment plan has been presented to and reviewed with the patient, Beth [name], and/or family member,.  The patient and family have been given the opportunity to ask questions and make suggestions.  [name], RN 12/19/2018. [phone]

## 2018-12-20 NOTE — BHH Suicide Risk Assessment (Signed)
[facility] Admission Suicide Risk Assessment   Nursing information obtained from:  Patient, Review of record Demographic factors:  Caucasian, Living alone Current Mental Status:  Self-harm thoughts, Self-harm behaviors, Suicidal ideation indicated by patient, Suicide plan, Plan includes specific time, place, or method("only when I drink") Loss Factors:  NA Historical Factors:  Family history of mental illness or substance abuse, Impulsivity Risk Reduction Factors:  Sense of responsibility to family, Employed, Positive social support, Positive therapeutic relationship  Total Time spent with patient: 45 minutes Principal Problem: <principal problem not specified> Diagnosis:  Active Problems:   MDD (major depressive disorder), recurrent episode, severe (HCC)  Subjective Data: Patient states she relapsed about 6 weeks ago on alcohol and uses cannabis every other day - described becoming intoxicated Tuesday night and cutting herself and referred for inpatient stabilization/detox measures  Continued Clinical Symptoms:  Alcohol Use Disorder Identification Test Final Score (AUDIT): 34 The "Alcohol Use Disorders Identification Test", Guidelines for Use in Primary Care, Second Edition.  World Science writer [facility]). Score between 0-7:  no or low risk or alcohol related problems. Score between 8-15:  moderate risk of alcohol related problems. Score between 16-19:  high risk of alcohol related problems. Score 20 or above:  warrants further diagnostic evaluation for alcohol dependence and treatment.   CLINICAL FACTORS:   Dysthymia   Musculoskeletal: Strength & Muscle Tone: within normal limits Gait & Station: normal Patient leans: N/A  Psychiatric Specialty Exam: Physical Exam  ROS  Blood pressure (!) 127/91, pulse (!) 101, temperature 98.2 F (36.8 C), temperature source Oral, resp. rate 18, height 5\' 3"  (1.6 m), weight 73 kg.Body mass index is 28.52 kg/m.  General Appearance: Disheveled   Eye Contact:  Good  Speech:  Clear and Coherent  Volume:  Decreased  Mood:  Dysphoric  Affect:  Blunt  Thought Process:  Coherent and Linear  Orientation:  Full (Time, Place, and Person)  Thought Content:  Logical and Tangential  Suicidal Thoughts:  No  Homicidal Thoughts:  No  Memory:  Immediate;   Fair  Judgement:  Fair  Insight:  Fair and Lacking  Psychomotor Activity:  Normal  Concentration:  Concentration: Fair  Recall:  [employer] of Knowledge:  Fair  Language:  Fair  Akathisia:  Negative  Handed:  Right  AIMS (if indicated):     Assets:  Resilience Social Support  ADL's:  Intact  Cognition:  WNL  Sleep:  Number of Hours: 5.75      COGNITIVE FEATURES THAT CONTRIBUTE TO RISK:  None    SUICIDE RISK:   Minimal: No identifiable suicidal ideation.  Patients presenting with no risk factors but with morbid ruminations; may be classified as minimal risk based on the severity of the depressive symptoms  PLAN OF CARE: Detox measures underway resume lithium and fluoxetine continue cognitive and rehab therapies  I certify that inpatient services furnished can reasonably be expected to improve the patient's condition.   Malvin Johns, MD 12/20/2018, 9:27 AM
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
## 2018-12-20 NOTE — Progress Notes (Signed)
D: Pt was in dayroom upon initial approach.  Pt presents with depressed affect and mood.  She reports her day has been "okay."  Her goal is to "get the most out of this."  Pt denies SI/HI, denies hallucinations, denies pain.  Pt has been visible in milieu interacting with peers and staff appropriately.  Pt attended evening group.    A: Introduced self to pt.  Met with pt 1:1.  Actively listened to pt and offered support and encouragement.  Medications administered per order.  PRN medication administered for sleep.  Fall prevention techniques reviewed with pt and she verbalized understanding.  Q15 minute safety checks maintained.  R: Pt is safe on the unit.  Pt is compliant with medications.  Pt verbally contracts for safety.  Will continue to monitor and assess.

## 2018-12-20 NOTE — Progress Notes (Signed)
D: Patient alert and oriented. Affect/mood: anxious in affect, depressed in mood. Denies SI, HI, AVH at this time, patient shares that suicidal thoughts typically arise when she is drinking. Patient verbalizes a desire to refrain from consuming alcohol upon discharge, and expresses that she is upset with herself for relapsing. Denies pain. Patient identified goal is to "try my best". Patient remains pleasant during all interactions.   A: Scheduled medications administered to patient per MD order. Support and encouragement provided. Routine safety checks conducted every 15 minutes. Patient informed to notify staff with problems or concerns.  R: No adverse drug reactions noted. Patient contracts for safety at this time. Patient compliant with medications and treatment plan. Patient remains cooperative, though tends to retreat to her room to lie down. Patient interacts minimally with others on the unit. Patient remains safe at this time. Will continue to monitor.

## 2018-12-20 NOTE — Progress Notes (Signed)
[place] NOVEL CORONAVIRUS (COVID-19) DAILY CHECK-OFF SYMPTOMS - answer yes or no to each - every day NO YES  Have you had a fever in the past 24 hours?  . Fever (Temp > 37.80C / 100F) X   Have you had any of these symptoms in the past 24 hours? . New Cough .  Sore Throat  .  Shortness of Breath .  Difficulty Breathing .  Unexplained Body Aches   X   Have you had any one of these symptoms in the past 24 hours not related to allergies?   . Runny Nose .  Nasal Congestion .  Sneezing   X   If you have had runny nose, nasal congestion, sneezing in the past 24 hours, has it worsened?  X   EXPOSURES - check yes or no X   Have you traveled outside the state in the past 14 days?  X   Have you been in contact with someone with a confirmed diagnosis of COVID-19 or PUI in the past 14 days without wearing appropriate PPE?  X   Have you been living in the same home as a person with confirmed diagnosis of COVID-19 or a PUI (household contact)?    X   Have you been diagnosed with COVID-19?    X              What to do next: Answered NO to all: Answered YES to anything:   Proceed with unit schedule Follow the [facility] Inpatient Flowsheet.   
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
## 2018-12-20 NOTE — [facility] Group Notes (Signed)
[facility] LCSW Group Therapy Note  Date/Time: 12/20/18, 1315  Type of Therapy and Topic:  Group Therapy:  Feelings around Relapse and Recovery  Participation Level:  Active   Mood:pleasant  Description of Group:    Patients in this group will discuss emotions they experience before and after a relapse. They will process how experiencing these feelings, or avoidance of experiencing them, relates to having a relapse. Facilitator will guide patients to explore emotions they have related to recovery. Patients will be encouraged to process which emotions are more powerful. They will be guided to discuss the emotional reaction significant others in their lives may have to patients' relapse or recovery. Patients will be assisted in exploring ways to respond to the emotions of others without this contributing to a relapse.  Therapeutic Goals: 1. Patient will identify two or more emotions that lead to relapse for them:  2. Patient will identify two emotions that result when they relapse:  3. Patient will identify two emotions related to recovery:  4. Patient will demonstrate ability to communicate their needs through discussion and/or role plays.   Summary of Patient Progress:Pt quiet for the majority of group but then shared on several occasions about her recent relapse and her prior involvement with AA that helped her stay sober for 2.5 years.  Pt shared that shame is an emotion that can lead to relapse for her.  Pt attentive throughout.      Therapeutic Modalities:   Cognitive Behavioral Therapy Solution-Focused Therapy Assertiveness Training Relapse Prevention Therapy  Daleen Squibb, LCSW

## 2018-12-21 NOTE — Plan of Care (Signed)
  Problem: Education: Goal: Knowledge of disease or condition will improve Outcome: Progressing Goal: Understanding of discharge needs will improve Outcome: Progressing   

## 2018-12-21 NOTE — Progress Notes (Signed)
[facility] Group Notes:  (Nursing/MHT/Case Management/Adjunct)  Date:  12/21/2018  Time:  2030  Type of Therapy:  wrap up group  Participation Level:  Active  Participation Quality:  Appropriate, Attentive, Sharing and Supportive  Affect:  Appropriate and Excited  Cognitive:  Appropriate  Insight:  Improving  Engagement in Group:  Engaged  Modes of Intervention:  Clarification, Education and Support  Summary of Progress/Problems: Pt shared if she could change any one thing about her life it would be to obliterate the Trump regime. Pt enjoyed eating applesauce today and is grateful for her 2 cats. Pt would like to know her results from her lab work.   Beth [name] 12/21/2018, 9:42 PM
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
## 2018-12-21 NOTE — [facility] Group Notes (Signed)
[facility] Group Notes:  (Nursing)  Date:  12/21/2018  Time:  130 PM Type of Therapy:  Nurse Education  Participation Level:  Active  Participation Quality:  Appropriate  Affect:  Appropriate  Cognitive:  Appropriate  Insight:  Appropriate  Engagement in Group:  Engaged  Modes of Intervention:  Education  Summary of Progress/Problems: Life Skills group Beth [name] 12/21/2018, 8:42 PM
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
## 2018-12-21 NOTE — Progress Notes (Signed)
Adult Psychoeducational Group Note  Date:  12/21/2018 Time:  10:40 AM  Group Topic/Focus:  Goals Group:   The focus of this group is to help patients establish daily goals to achieve during treatment and discuss how the patient can incorporate goal setting into their daily lives to aide in recovery. Orientation:   The focus of this group is to educate the patient on the purpose and policies of crisis stabilization and provide a format to answer questions about their admission.  The group details unit policies and expectations of patients while admitted.  Participation Level:  Active  Participation Quality:  Appropriate  Affect:  Appropriate  Cognitive:  Alert  Insight: Appropriate  Engagement in Group:  Engaged  Modes of Intervention:  Discussion and Education  Additional Comments:    Pt participated in goals group. Pt's goal today is to talk to a doctor and develop a discharge plan. Pt stated she is here because she started drinking again after 2.5 years. She became very anxious and paranoid due to Covid-19.   Karren Cobble 12/21/2018, 10:40 AM

## 2018-12-21 NOTE — Progress Notes (Signed)
D Pt affect remains flat, depressed and blunted. She is well dressed and clean wearing her street clothes. She is seen sitting quietly in the 300 hall dayroom./     A She completed her daily assessment and on this she wrote she denied SI today and she rated her dperession, hopelessness and anxiety " 4/2/4", respectively.     R Safety in place.

## 2018-12-21 NOTE — Progress Notes (Signed)
D: Pt was in dayroom upon initial approach.  Pt presents with appropriate affect and mood.  She reports her day was "good" and her goal is to "hopefully discharge tomorrow."  She reports she feels safe to discharge.  Pt denies SI/HI, denies hallucinations, reports R leg pain of 5/10.  Pt has been visible in milieu interacting with peers and staff appropriately.  Pt attended evening group.    A: Met with pt 1:1.  Actively listened to pt and offered support and encouragement. Medications administered per order.  PRN medication administered for pain and anxiety.  Q15 minute safety checks maintained.  R: Pt is safe on the unit.  Pt is compliant with medications.  Pt verbally contracts for safety.  Will continue to monitor and assess.

## 2018-12-21 NOTE — Progress Notes (Signed)
   12/21/18 2304  COVID-19 Daily Checkoff  Have you had a fever (temp > 37.80C/100F)  in the past 24 hours?  No  COVID-19 EXPOSURE  Have you traveled outside the state in the past 14 days? No  Have you been in contact with someone with a confirmed diagnosis of COVID-19 or PUI in the past 14 days without wearing appropriate PPE? No  Have you been living in the same home as a person with confirmed diagnosis of COVID-19 or a PUI (household contact)? No  Have you been diagnosed with COVID-19? No

## 2018-12-21 NOTE — Progress Notes (Signed)
   12/21/18 0001  COVID-19 Daily Checkoff  Have you had a fever (temp > 37.80C/100F)  in the past 24 hours?  No  COVID-19 EXPOSURE  Have you traveled outside the state in the past 14 days? No  Have you been in contact with someone with a confirmed diagnosis of COVID-19 or PUI in the past 14 days without wearing appropriate PPE? No  Have you been living in the same home as a person with confirmed diagnosis of COVID-19 or a PUI (household contact)? No  Have you been diagnosed with COVID-19? No

## 2018-12-22 MED ORDER — LITHIUM CARBONATE ER 300 MG PO TBCR: 600.0000 mg | EXTENDED_RELEASE_TABLET | Freq: Every day | ORAL | 0 refills | Status: DC

## 2018-12-22 MED ORDER — FLUOXETINE HCL 40 MG PO CAPS: 40.0000 mg | ORAL_CAPSULE | Freq: Every day | ORAL | 0 refills | Status: DC

## 2018-12-22 MED ORDER — TRAZODONE HCL 50 MG PO TABS: 50.0000 mg | ORAL_TABLET | Freq: Every evening | ORAL | 0 refills | Status: DC | PRN

## 2018-12-22 MED ORDER — TRAZODONE HCL 50 MG PO TABS
50.0000 mg | ORAL_TABLET | Freq: Every evening | ORAL | Status: DC | PRN
  Filled 2018-12-22: qty 7

## 2018-12-22 MED ORDER — HYDROXYZINE HCL 50 MG PO TABS: 50.0000 mg | ORAL_TABLET | Freq: Four times a day (QID) | ORAL | 1 refills | Status: DC | PRN

## 2018-12-22 MED ORDER — PRAZOSIN HCL 1 MG PO CAPS: 1.0000 mg | ORAL_CAPSULE | Freq: Every day | ORAL | 0 refills | Status: DC

## 2018-12-22 MED ORDER — LITHIUM CARBONATE ER 300 MG PO TBCR: 300.0000 mg | EXTENDED_RELEASE_TABLET | Freq: Every morning | ORAL | 0 refills | Status: DC

## 2018-12-22 NOTE — [facility] Group Notes (Signed)
[facility] LCSW Group Therapy Note  12/22/2018  10:00-11:00AM  Type of Therapy and Topic:  Group Therapy:  Acknowledging and Resolving Issues with Mothers  Participation Level:  Did Not Attend   Description of Group:   Patients in this group were asked to briefly describe their experience with the mother figure(s) in their lives, both in childhood and adulthood.  Different types of support provided by these individuals were identified.   Patients were then encouraged to determine whether their mother figure was or is a healthy or unhealthy support.  The manner in which that early relationship has shaped patient's feelings and life decisions was pointed out and acknowledged.  Group members gave support to each other.  CSW led a discussion on how helpful it can be to resolve past issues, and how this can be done whether the mother figure is now alive or already deceased.  An emphasis was placed on continuing to work with a therapist on these issues  when patients leave the hospital in order to be able to focus on the future instead of the past, to continue becoming healthier and happier.   Therapeutic Goals: 1)  discuss the possibility of mother figure(s) being positive and/or negative in one's life, normalizing that some people never had positive experiences with "maternal" persons  2)  describe patient's specific example of mother figure(s), allowing time to vent  3)  identify the patient's current need for resolution in the relationship with the aforementioned person  4)  elicit commitments to work on resolving feelings about mother figure(s) in order to move forward in life and wellness   Summary of Patient Progress:  N/A   Therapeutic Modalities:   Processing Brief Solution-Focused Therapy  Tycen Dockter J Grossman-Orr       
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
## 2018-12-22 NOTE — Progress Notes (Signed)
  [facility] Adult Case Management Discharge Plan :  Will you be returning to the same living situation after discharge:  Yes,  with partner At discharge, do you have transportation home?: Yes,  car at hospital, needs to go by security to car Do you have the ability to pay for your medications: Yes,  insurance and income  Release of information consent forms completed and turned in to Medical Records by CSW.   Patient to Follow up at: Follow-up Information    [facility] Follow up.   Specialty:  Behavioral Health Why:  Your appointments with Dr. Lolly Mustache and Herby Abraham will be scheduled and your social worker will call you Monday with the information.  If you do not hear on Monday, please call your social worker at [phone] to follow up. Contact information: [address] [place] [address] [phone]          Next level of care provider has access to [facility] Link:yes  Safety Planning and Suicide Prevention discussed: Yes,  with partner Cletus Gash  Have you used any form of tobacco in the last 30 days? (Cigarettes, Smokeless Tobacco, Cigars, and/or Pipes): Yes  Has patient been referred to the Quitline?: Patient refused referral  Patient has been referred for addiction treatment: Yes  Lynnell Chad, LCSW 12/22/2018, 2:21 PM

## 2018-12-22 NOTE — [facility] Suicide Risk Assessment (Signed)
[facility] INPATIENT:  Family/Significant Other Suicide Prevention Education  Suicide Prevention Education:  Contact Attempts: Cletus Gash [phone], (name of family member/significant other) has been identified by the patient as the family member/significant other with whom the patient will be residing, and identified as the person(s) who will aid the patient in the event of a mental health crisis.  With written consent from the patient, two attempts were made to provide suicide prevention education, prior to and/or following the patient's discharge.  We were unsuccessful in providing suicide prevention education.  A suicide education pamphlet was given to the patient to share with family/significant other.  Date and time of first attempt:  12/22/2018  /  2:05pm Date and time of second attempt:   To be done by CSW  Lynnell Chad 12/22/2018, 2:16 PM

## 2018-12-22 NOTE — [facility] Suicide Risk Assessment (Deleted)
[facility] Discharge Suicide Risk Assessment   Principal Problem: MDD (major depressive disorder), recurrent episode, severe (HCC) Discharge Diagnoses: Principal Problem:   MDD (major depressive disorder), recurrent episode, severe (HCC)   Total Time spent with patient: 15 minutes  Musculoskeletal: Strength & Muscle Tone: within normal limits Gait & Station: normal Patient leans: N/A  Psychiatric Specialty Exam: Review of Systems  All other systems reviewed and are negative.   Blood pressure 113/84, pulse (!) 130, temperature 99.4 F (37.4 C), temperature source Oral, resp. rate 18, height 5\' 3"  (1.6 m), weight 73 kg.Body mass index is 28.52 kg/m.  General Appearance: Casual  Eye Contact::  Fair  Speech:  Normal Rate409  Volume:  Normal  Mood:  Anxious  Affect:  Congruent  Thought Process:  Coherent and Descriptions of Associations: Intact  Orientation:  Full (Time, Place, and Person)  Thought Content:  Logical  Suicidal Thoughts:  No  Homicidal Thoughts:  No  Memory:  Immediate;   Fair Recent;   Fair Remote;   Fair  Judgement:  Intact  Insight:  Fair  Psychomotor Activity:  Normal  Concentration:  Fair  Recall:  [employer] of Knowledge:Good  Language: Good  Akathisia:  Negative  Handed:  Right  AIMS (if indicated):     Assets:  Desire for Improvement Resilience  Sleep:  Number of Hours: 6  Cognition: WNL  ADL's:  Intact   Mental Status Per Nursing Assessment::   On Admission:  Self-harm thoughts, Self-harm behaviors, Suicidal ideation indicated by patient, Suicide plan, Plan includes specific time, place, or method("only when I drink")  Demographic Factors:  Caucasian and Gay, lesbian, or bisexual orientation  Loss Factors: Financial problems/change in socioeconomic status  Historical Factors: Impulsivity  Risk Reduction Factors:   Employed, Living with another person, especially a relative, Positive social support and Positive therapeutic  relationship  Continued Clinical Symptoms:  Bipolar Disorder:   Depressive phase Alcohol/Substance Abuse/Dependencies  Cognitive Features That Contribute To Risk:  None    Suicide Risk:  Minimal: No identifiable suicidal ideation.  Patients presenting with no risk factors but with morbid ruminations; may be classified as minimal risk based on the severity of the depressive symptoms    Plan Of Care/Follow-up recommendations:  Activity:  ad lib  Antonieta Pert, MD 12/22/2018, 12:22 PM

## 2018-12-22 NOTE — Progress Notes (Signed)
Pt completed her daily assessment and on this she wrote she denied having SI today and she rated her depression, hopelessness and anxiety ""3/1/5",respectively. She is given her dc instructions by this Clinical research associate, they were discussed and reviewed with her by Clinical research associate and she stated verbal understanding and willingness to comply. All belongings in her locker were returned to her by Clinical research associate, she was given 7 day samples of meds from pharmacy and then she was escorted to bldg entrance , where [employer] picked her up, drove her to [employer] parking lot to get her car ( she had parked there prior to her admission here). Pt dc'd per MD order.

## 2018-12-22 NOTE — [facility] Suicide Risk Assessment (Signed)
[facility] Discharge Suicide Risk Assessment   Principal Problem: MDD (major depressive disorder), recurrent episode, severe (HCC) Discharge Diagnoses: Principal Problem:   MDD (major depressive disorder), recurrent episode, severe (HCC)   Total Time spent with patient: 15 minutes  Musculoskeletal: Strength & Muscle Tone: within normal limits Gait & Station: normal Patient leans: N/A  Psychiatric Specialty Exam: Review of Systems  All other systems reviewed and are negative.   Blood pressure 113/84, pulse (!) 130, temperature 99.4 F (37.4 C), temperature source Oral, resp. rate 18, height 5\' 3"  (1.6 m), weight 73 kg.Body mass index is 28.52 kg/m.  General Appearance: Casual  Eye Contact::  Good  Speech:  Normal Rate409  Volume:  Normal  Mood:  Anxious  Affect:  Congruent  Thought Process:  Coherent and Descriptions of Associations: Intact  Orientation:  Full (Time, Place, and Person)  Thought Content:  Logical  Suicidal Thoughts:  No  Homicidal Thoughts:  No  Memory:  Immediate;   Fair Recent;   Fair Remote;   Fair  Judgement:  Intact  Insight:  Fair  Psychomotor Activity:  Normal  Concentration:  Fair  Recall:  [employer] of Knowledge:Good  Language: Good  Akathisia:  Negative  Handed:  Right  AIMS (if indicated):     Assets:  Desire for Improvement Resilience  Sleep:  Number of Hours: 6  Cognition: WNL  ADL's:  Intact   Mental Status Per Nursing Assessment::   On Admission:  Self-harm thoughts, Self-harm behaviors, Suicidal ideation indicated by patient, Suicide plan, Plan includes specific time, place, or method("only when I drink")  Demographic Factors:  Caucasian and Low socioeconomic status  Loss Factors: Financial problems/change in socioeconomic status  Historical Factors: Impulsivity  Risk Reduction Factors:   Living with another person, especially a relative, Positive social support and Positive therapeutic relationship  Continued Clinical  Symptoms:  Bipolar Disorder:   Depressive phase Alcohol/Substance Abuse/Dependencies  Cognitive Features That Contribute To Risk:  None    Suicide Risk:  Minimal: No identifiable suicidal ideation.  Patients presenting with no risk factors but with morbid ruminations; may be classified as minimal risk based on the severity of the depressive symptoms    Plan Of Care/Follow-up recommendations:  Activity:  ad lib  Beth Pert, MD 12/22/2018, 12:26 PM

## 2018-12-22 NOTE — Plan of Care (Signed)
  Problem: Coping: Goal: Coping ability will improve Outcome: Progressing   Problem: Medication: Goal: Compliance with prescribed medication regimen will improve Outcome: Progressing   

## 2018-12-22 NOTE — [facility] Suicide Risk Assessment (Signed)
[facility] INPATIENT:  Family/Significant Other Suicide Prevention Education  Suicide Prevention Education:  Education Completed; Partner Cletus Gash [phone],  (name of family member/significant other) has been identified by the patient as the family member/significant other with whom the patient will be residing, and identified as the person(s) who will aid the patient in the event of a mental health crisis (suicidal ideations/suicide attempt).  With written consent from the patient, the family member/significant other has been provided the following suicide prevention education, prior to the and/or following the discharge of the patient.  The suicide prevention education provided includes the following:  Suicide risk factors  Suicide prevention and interventions  National Suicide Hotline telephone number  [facility] assessment telephone number  [facility] Emergency Assistance 911  [facility] and/or Residential Mobile Crisis Unit telephone number  Request made of family/significant other to:  Remove weapons (e.g., guns, rifles, knives), all items previously/currently identified as safety concern.    Remove drugs/medications (over-the-counter, prescriptions, illicit drugs), all items previously/currently identified as a safety concern.  The family member/significant other verbalizes understanding of the suicide prevention education information provided.  The family member/significant other agrees to remove the items of safety concern listed above.  Beth [name] 12/22/2018, 2:21 PM

## 2018-12-31 ENCOUNTER — Other Ambulatory Visit: Payer: Self-pay

## 2018-12-31 ENCOUNTER — Ambulatory Visit (INDEPENDENT_AMBULATORY_CARE_PROVIDER_SITE_OTHER): Payer: BLUE CROSS/BLUE SHIELD | Admitting: Psychiatry

## 2018-12-31 ENCOUNTER — Encounter (HOSPITAL_COMMUNITY): Payer: Self-pay | Admitting: Psychiatry

## 2018-12-31 DIAGNOSIS — F1010 Alcohol abuse, uncomplicated: Secondary | ICD-10-CM | POA: Diagnosis not present

## 2018-12-31 DIAGNOSIS — F4310 Post-traumatic stress disorder, unspecified: Secondary | ICD-10-CM

## 2018-12-31 DIAGNOSIS — F333 Major depressive disorder, recurrent, severe with psychotic symptoms: Secondary | ICD-10-CM

## 2018-12-31 MED ORDER — TRAZODONE HCL 50 MG PO TABS: 50.0000 mg | ORAL_TABLET | Freq: Every evening | ORAL | 0 refills | Status: DC | PRN

## 2018-12-31 MED ORDER — HYDROXYZINE HCL 50 MG PO TABS: 50.0000 mg | ORAL_TABLET | Freq: Four times a day (QID) | ORAL | 1 refills | Status: DC | PRN

## 2018-12-31 MED ORDER — FLUOXETINE HCL 40 MG PO CAPS: 40.0000 mg | ORAL_CAPSULE | Freq: Every day | ORAL | 0 refills | Status: DC

## 2018-12-31 MED ORDER — LITHIUM CARBONATE ER 300 MG PO TBCR: EXTENDED_RELEASE_TABLET | ORAL | 0 refills | Status: DC

## 2018-12-31 NOTE — Progress Notes (Signed)
Virtual Visit via Telephone Note  I connected with Beth [name] on 12/31/18 at  2:40 PM EDT by telephone and verified that I am speaking with the correct person using two identifiers.   I discussed the limitations, risks, security and privacy concerns of performing an evaluation and management service by telephone and the availability of in person appointments. I also discussed with the patient that there may be a patient responsible charge related to this service. The patient expressed understanding and agreed to proceed.   History of Present Illness: Patient was evaluated by phone session.  She was recently discharged from behavioral health center due to relapse into drinking and having suicidal thoughts and superficial cut.  Patient is pleased that since she left the hospital she is not drinking and not involved in any cutting herself.  She is taking lithium however her level remains low.  She had a blood work on May 5 and her lithium level was 0.27.  Patient told her anxiety got worse before hospitalization due to pandemic and working 2 jobs.  Now she has decided to work only 1 job.  When questioned about medication she admitted not taking the Prozac and Minipress as she did not fill the prescription since left the hospital.  But she is taking hydroxyzine and trazodone.  Sometimes she takes extra trazodone to go to sleep.  She is taking lithium as prescribed.  She reported no tremors, shakes or any EPS.  She admitted not able to get therapy with Dimas Chyle but like to restart therapy since she is only working one job.  She admitted overall feeling better and realized that she need to work on her stopping drinking.  Patient reported no side effects of the medication.  She denies any paranoia or any hallucination.    Past Psychiatric History: Reviewed. H/O depression, anxiety, PTSD, cutting and substance use. Sawpsychiatrist at [facility] counseling center. Abuse Ambien, Vyvanse, Xanax, and  Ativan. H/O inpatient in October 2017. Took Cymbalta, Wellbutrin, Lexapro, Seroquel with poor outcome.TMS helped.   Recent Results (from the past 2160 hour(s))  SARS Coronavirus 2 (CEPHEID - Performed in [facility] hospital lab), Hosp Order     Status: None   Collection Time: 12/19/18  7:05 PM  Result Value Ref Range   SARS Coronavirus 2 NEGATIVE NEGATIVE    Comment: (NOTE) If result is NEGATIVE SARS-CoV-2 target nucleic acids are NOT DETECTED. The SARS-CoV-2 RNA is generally detectable in upper and lower  respiratory specimens during the acute phase of infection. The lowest  concentration of SARS-CoV-2 viral copies this assay can detect is 250  copies / mL. A negative result does not preclude SARS-CoV-2 infection  and should not be used as the sole basis for treatment or other  patient management decisions.  A negative result may occur with  improper specimen collection / handling, submission of specimen other  than nasopharyngeal swab, presence of viral mutation(s) within the  areas targeted by this assay, and inadequate number of viral copies  (<250 copies / mL). A negative result must be combined with clinical  observations, patient history, and epidemiological information. If result is POSITIVE SARS-CoV-2 target nucleic acids are DETECTED. The SARS-CoV-2 RNA is generally detectable in upper and lower  respiratory specimens dur ing the acute phase of infection.  Positive  results are indicative of active infection with SARS-CoV-2.  Clinical  correlation with patient history and other diagnostic information is  necessary to determine patient infection status.  Positive results do  not rule out bacterial infection or co-infection with other viruses. If result is PRESUMPTIVE POSTIVE SARS-CoV-2 nucleic acids MAY BE PRESENT.   A presumptive positive result was obtained on the submitted specimen  and confirmed on repeat testing.  While 2019 novel coronavirus  (SARS-CoV-2) nucleic  acids may be present in the submitted sample  additional confirmatory testing may be necessary for epidemiological  and / or clinical management purposes  to differentiate between  SARS-CoV-2 and other Sarbecovirus currently known to infect humans.  If clinically indicated additional testing with an alternate test  methodology [phone]) is advised. The SARS-CoV-2 RNA is generally  detectable in upper and lower respiratory sp ecimens during the acute  phase of infection. The expected result is Negative. Fact Sheet for Patients:  BoilerBrush.com.cy Fact Sheet for Healthcare Providers: https://pope.com/ This test is not yet approved or cleared by the [place] FDA and has been authorized for detection and/or diagnosis of SARS-CoV-2 by FDA under an Emergency Use Authorization (EUA).  This EUA will remain in effect (meaning this test can be used) for the duration of the COVID-19 declaration under Section 564(b)(1) of the Act, 21 U.S.C. section 360bbb-3(b)(1), unless the authorization is terminated or revoked sooner. Performed at [facility], [address] [place], [place] [address]   Urine rapid drug screen (hosp performed)     Status: Abnormal   Collection Time: 12/19/18  8:03 PM  Result Value Ref Range   Opiates NONE DETECTED NONE DETECTED   Cocaine NONE DETECTED NONE DETECTED   Benzodiazepines NONE DETECTED NONE DETECTED   Amphetamines NONE DETECTED NONE DETECTED   Tetrahydrocannabinol POSITIVE (A) NONE DETECTED   Barbiturates NONE DETECTED NONE DETECTED    Comment: (NOTE) DRUG SCREEN FOR MEDICAL PURPOSES ONLY.  IF CONFIRMATION IS NEEDED FOR ANY PURPOSE, NOTIFY LAB WITHIN 5 DAYS. LOWEST DETECTABLE LIMITS FOR URINE DRUG SCREEN Drug Class                     Cutoff (ng/mL) Amphetamine and metabolites    1000 Barbiturate and metabolites    200 Benzodiazepine                 200 Tricyclics and metabolites      300 Opiates and metabolites        300 Cocaine and metabolites        300 THC                            50 Performed at [facility], [address] [place], [place] [address]   Lithium level     Status: Abnormal   Collection Time: 12/19/18  8:06 PM  Result Value Ref Range   Lithium Lvl 0.27 (L) 0.60 - 1.20 mmol/L    Comment: Performed at [facility], [address] [place], [place] [address]  Comprehensive metabolic panel     Status: Abnormal   Collection Time: 12/19/18  8:06 PM  Result Value Ref Range   Sodium 135 135 - 145 mmol/L   Potassium 4.0 3.5 - 5.1 mmol/L   Chloride 108 98 - 111 mmol/L   CO2 19 (L) 22 - 32 mmol/L   Glucose, Bld 102 (H) 70 - 99 mg/dL   BUN 6 6 - 20 mg/dL   Creatinine, Ser 5.62 0.44 - 1.00 mg/dL   Calcium 8.8 (L) 8.9 - 10.3 mg/dL   Total Protein 6.4 (L) 6.5 -  8.1 g/dL   Albumin 3.6 3.5 - 5.0 g/dL   AST 22 15 - 41 U/L   ALT 23 0 - 44 U/L   Alkaline Phosphatase 76 38 - 126 U/L   Total Bilirubin 0.3 0.3 - 1.2 mg/dL   GFR calc non Af Amer >60 >60 mL/min   GFR calc Af Amer >60 >60 mL/min   Anion gap 8 5 - 15    Comment: Performed at [facility], [address], [place], [place] [address]  Ethanol     Status: None   Collection Time: 12/19/18  8:06 PM  Result Value Ref Range   Alcohol, Ethyl (B) <10 <10 mg/dL    Comment: (NOTE) Lowest detectable limit for serum alcohol is 10 mg/dL. For medical purposes only. Performed at [facility], [address] [place], [place] [address]   CBC with Diff     Status: Abnormal   Collection Time: 12/19/18  8:06 PM  Result Value Ref Range   WBC 10.2 4.0 - 10.5 K/uL   RBC 3.80 (L) 3.87 - 5.11 MIL/uL   Hemoglobin 11.9 (L) 12.0 - 15.0 g/dL   HCT 09.8 11.9 - 14.7 %   MCV 95.8 80.0 - 100.0 fL   MCH 31.3 26.0 - 34.0 pg   MCHC 32.7 30.0 - 36.0 g/dL   RDW 82.9 56.2 - 13.0 %   Platelets 279 150 - 400 K/uL   nRBC 0.0 0.0 - 0.2 %    Neutrophils Relative % 76 %   Neutro Abs 7.6 1.7 - 7.7 K/uL   Lymphocytes Relative 15 %   Lymphs Abs 1.6 0.7 - 4.0 K/uL   Monocytes Relative 6 %   Monocytes Absolute 0.6 0.1 - 1.0 K/uL   Eosinophils Relative 3 %   Eosinophils Absolute 0.3 0.0 - 0.5 K/uL   Basophils Relative 0 %   Basophils Absolute 0.0 0.0 - 0.1 K/uL   Immature Granulocytes 0 %   Abs Immature Granulocytes 0.04 0.00 - 0.07 K/uL    Comment: Performed at [facility], [address], [place], [place] [address]  Acetaminophen level     Status: Abnormal   Collection Time: 12/19/18  8:06 PM  Result Value Ref Range   Acetaminophen (Tylenol), Serum <10 (L) 10 - 30 ug/mL    Comment: (NOTE) Therapeutic concentrations vary significantly. A range of 10-30 ug/mL  may be an effective concentration for many patients. However, some  are best treated at concentrations outside of this range. Acetaminophen concentrations >150 ug/mL at 4 hours after ingestion  and >50 ug/mL at 12 hours after ingestion are often associated with  toxic reactions. Performed at [facility], [address], [place], [place] [address]   Salicylate level     Status: None   Collection Time: 12/19/18  8:06 PM  Result Value Ref Range   Salicylate Lvl <7.0 2.8 - 30.0 mg/dL    Comment: Performed at [facility], [address], [place], [place] [address]  I-Stat beta hCG blood, ED     Status: None   Collection Time: 12/19/18  8:16 PM  Result Value Ref Range   I-stat hCG, quantitative <5.0 <5 mIU/mL   Comment 3            Comment:   GEST. AGE      CONC.  (mIU/mL)   <=1 WEEK        5 - 50     2 WEEKS  50 - 500     3 WEEKS       100 - 10,000     4 WEEKS     1,000 - 30,000        FEMALE AND NON-PREGNANT FEMALE:     LESS THAN 5 mIU/mL      Observations/Objective: Mental status attention done on the phone.  Patient describes her mood good.  Her speech is soft, fluent, clear and coherent.  Her  thought process logical and intact.  Her attention concentration is fair.  She denies any auditory or visual hallucination.  She denies any active or passive suicidal thoughts or homicidal thought.  There were no grandiosity, paranoia or psychosis.  She is alert and oriented x3.  Her cognition is good.  Her fund of knowledge is adequate.  She reported no tremors or shakes.  Her insight and judgment is fair.  Assessment and Plan: Major depressive disorder, recurrent.  Alcohol abuse.  Posttraumatic stress disorder.  History of eating disorder.  I reviewed records from recent hospitalization.  One more time I encouraged to take the medicine as prescribed.  Patient promised that she will pick up the medicine from the pharmacy.  Patient is not interested in Minipress as she had tried in the past with poor outcome.  I recommend she should try hydroxyzine and go back to Prozac which she has not taken since left the hospital.  I also recommend she should continue trazodone and Lithobid.  Her level is low.  We will do repeat level on her next appointment.  I also encouraged she should schedule to see Dimas ChyleSwan Kinney for therapy.  Discussed in detail medication side effects and benefits.  Recommended to call us back if she is any question or any concern.  Follow-up in 6 weeks to 8 weeks.  Follow Up Instructions:    I discussed the assessment and treatment plan with the patient. The patient was provided an opportunity to ask questions and all were answered. The patient agreed with the plan and demonstrated an understanding of the instructions.   The patient was advised to call back or seek an in-person evaluation if the symptoms worsen or if the condition fails to improve as anticipated.  I provided 25 minutes of non-face-to-face time during this encounter.   Cleotis NipperSyed T Dennice Tindol, MD

## 2019-02-04 ENCOUNTER — Other Ambulatory Visit (HOSPITAL_COMMUNITY): Payer: Self-pay | Admitting: *Deleted

## 2019-02-04 DIAGNOSIS — F333 Major depressive disorder, recurrent, severe with psychotic symptoms: Secondary | ICD-10-CM

## 2019-02-04 DIAGNOSIS — F4310 Post-traumatic stress disorder, unspecified: Secondary | ICD-10-CM

## 2019-02-04 MED ORDER — LITHIUM CARBONATE ER 300 MG PO TBCR: EXTENDED_RELEASE_TABLET | ORAL | 0 refills | Status: DC

## 2019-02-04 NOTE — Telephone Encounter (Signed)
Refill request for Lithium. Chart reviewed, next appointment 7/28. Refill given for 30 day supply.

## 2019-03-07 ENCOUNTER — Other Ambulatory Visit (HOSPITAL_COMMUNITY): Payer: Self-pay

## 2019-03-07 DIAGNOSIS — F333 Major depressive disorder, recurrent, severe with psychotic symptoms: Secondary | ICD-10-CM

## 2019-03-07 DIAGNOSIS — F4310 Post-traumatic stress disorder, unspecified: Secondary | ICD-10-CM

## 2019-03-07 MED ORDER — TRAZODONE HCL 50 MG PO TABS: 50.0000 mg | ORAL_TABLET | Freq: Every evening | ORAL | 0 refills | Status: DC | PRN

## 2019-03-11 ENCOUNTER — Other Ambulatory Visit (HOSPITAL_COMMUNITY): Payer: Self-pay

## 2019-03-11 ENCOUNTER — Other Ambulatory Visit: Payer: Self-pay

## 2019-03-11 ENCOUNTER — Ambulatory Visit (HOSPITAL_COMMUNITY): Payer: BLUE CROSS/BLUE SHIELD | Admitting: Psychiatry

## 2019-03-11 DIAGNOSIS — F333 Major depressive disorder, recurrent, severe with psychotic symptoms: Secondary | ICD-10-CM

## 2019-03-11 DIAGNOSIS — F1010 Alcohol abuse, uncomplicated: Secondary | ICD-10-CM

## 2019-03-11 DIAGNOSIS — F4310 Post-traumatic stress disorder, unspecified: Secondary | ICD-10-CM

## 2019-03-11 MED ORDER — LITHIUM CARBONATE ER 300 MG PO TBCR: EXTENDED_RELEASE_TABLET | ORAL | 0 refills | Status: DC

## 2019-03-11 MED ORDER — HYDROXYZINE HCL 50 MG PO TABS: 50.0000 mg | ORAL_TABLET | Freq: Four times a day (QID) | ORAL | 0 refills | Status: DC | PRN

## 2019-03-11 MED ORDER — FLUOXETINE HCL 40 MG PO CAPS: 40.0000 mg | ORAL_CAPSULE | Freq: Every day | ORAL | 0 refills | Status: DC

## 2019-03-24 ENCOUNTER — Encounter (HOSPITAL_COMMUNITY): Payer: Self-pay | Admitting: Psychiatry

## 2019-03-24 ENCOUNTER — Ambulatory Visit (INDEPENDENT_AMBULATORY_CARE_PROVIDER_SITE_OTHER): Payer: BC Managed Care – PPO | Admitting: Psychiatry

## 2019-03-24 ENCOUNTER — Other Ambulatory Visit: Payer: Self-pay

## 2019-03-24 DIAGNOSIS — Z79899 Other long term (current) drug therapy: Secondary | ICD-10-CM | POA: Diagnosis not present

## 2019-03-24 DIAGNOSIS — F333 Major depressive disorder, recurrent, severe with psychotic symptoms: Secondary | ICD-10-CM

## 2019-03-24 DIAGNOSIS — F4310 Post-traumatic stress disorder, unspecified: Secondary | ICD-10-CM | POA: Diagnosis not present

## 2019-03-24 DIAGNOSIS — F1010 Alcohol abuse, uncomplicated: Secondary | ICD-10-CM

## 2019-03-24 MED ORDER — HYDROXYZINE HCL 50 MG PO TABS: 50.0000 mg | ORAL_TABLET | Freq: Every day | ORAL | 0 refills | Status: DC | PRN

## 2019-03-24 MED ORDER — LITHIUM CARBONATE ER 300 MG PO TBCR: EXTENDED_RELEASE_TABLET | ORAL | 1 refills | Status: DC

## 2019-03-24 MED ORDER — TRAZODONE HCL 50 MG PO TABS: 50.0000 mg | ORAL_TABLET | Freq: Every evening | ORAL | 1 refills | Status: DC | PRN

## 2019-03-24 MED ORDER — FLUOXETINE HCL 40 MG PO CAPS: 40.0000 mg | ORAL_CAPSULE | Freq: Every day | ORAL | 1 refills | Status: DC

## 2019-03-24 NOTE — Progress Notes (Signed)
Virtual Visit via Telephone Note  I connected with Beth [name] on 03/24/19 at  2:40 PM EDT by telephone and verified that I am speaking with the correct person using two identifiers.   I discussed the limitations, risks, security and privacy concerns of performing an evaluation and management service by telephone and the availability of in person appointments. I also discussed with the patient that there may be a patient responsible charge related to this service. The patient expressed understanding and agreed to proceed.   History of Present Illness: Patient was evaluated by phone session.  She feels proud that she has been not drinking since June 6.  She takes lithium all pills at bedtime to help her sleep but also noticed she feels groggy in the morning.  She still struggle with depression and anxiety.  There is a questionable compliance as patient not sure how she is taking the medication.  She is not able to see her therapist due to COVID-19.  She is working 1 job and job is going well.  She does not want to change medication.  She has some anxiety but she feels it is working well.  She denies any suicidal thoughts or homicidal thoughts.  Her appetite is okay.  She has no tremors shakes or any EPS.  sHe does not have nightmares and she does not want to take Minipress.  Her appetite is okay.  She denies any paranoia or any self abusive behavior.   Past Psychiatric History:Reviewed. H/Odepression, anxiety, PTSD,cutting andsubstance use. [facility]. Amado Coe, Xanax,andAtivan. H/O inpatientin October 2017. TookCymbalta, Wellbutrin, Lexapro, Seroquel with poor outcome.TMS helped.    Psychiatric Specialty Exam: Physical Exam  ROS  There were no vitals taken for this visit.There is no height or weight on file to calculate BMI.  General Appearance: NA  Eye Contact:  NA  Speech:  Clear and Coherent and Slow  Volume:  Normal  Mood:   tired  Affect:  NA  Thought Process:  Goal Directed  Orientation:  Full (Time, Place, and Person)  Thought Content:  Rumination  Suicidal Thoughts:  No  Homicidal Thoughts:  No  Memory:  Immediate;   Fair Recent;   Fair Remote;   Good  Judgement:  Fair  Insight:  Fair  Psychomotor Activity:  NA  Concentration:  Concentration: Fair and Attention Span: Fair  Recall:  [employer] of Knowledge:  Good  Language:  Good  Akathisia:  No  Handed:  Right  AIMS (if indicated):     Assets:  Communication Skills Desire for Improvement Housing Resilience Transportation  ADL's:  Intact  Cognition:  WNL  Sleep:   ok      Assessment and Plan: Major depressive disorder, recurrent.  Alcohol abuse.  Posttraumatic stress disorder.  Discussed her compliance issue.  Recommend to take lithium 1 tablet in the morning and 2 at bedtime to avoid any sedation and feeling tired in the morning.  Encouraged to take the Prozac as prescribed and hydroxyzine 50 mg as needed for insomnia.  She is also taking trazodone and I recommend if she is sleeping good with hydroxyzine then she can skip and not to take the trazodone.  Patient is not seeing therapist due to [place] and I recommend if she is interested we can schedule appointment to therapist number of office.  Patient promised that she will give [place] a call back.  We will also do blood work including lithium level and hemoglobin A1c.  Encouraged to keep in a meeting to avoid relapse into alcohol.  Recommended to call us back if she is any question or any concern.  Follow-up in 2 months.  Follow Up Instructions:    I discussed the assessment and treatment plan with the patient. The patient was provided an opportunity to ask questions and all were answered. The patient agreed with the plan and demonstrated an understanding of the instructions.   The patient was advised to call back or seek an in-person evaluation if the symptoms worsen or if the condition fails  to improve as anticipated.  I provided 15 minutes of non-face-to-face time during this encounter.   Cleotis NipperSyed T Jacari Kirsten, MD

## 2019-04-13 ENCOUNTER — Other Ambulatory Visit: Payer: Self-pay

## 2019-04-13 ENCOUNTER — Encounter (HOSPITAL_COMMUNITY): Payer: Self-pay

## 2019-04-13 ENCOUNTER — Emergency Department (HOSPITAL_COMMUNITY)
Admission: EM | Admit: 2019-04-13 | Discharge: 2019-04-14 | Disposition: A | Discharge status: Home / Self Care | Payer: No Typology Code available for payment source | Attending: Emergency Medicine | Admitting: Emergency Medicine

## 2019-04-13 ENCOUNTER — Emergency Department (HOSPITAL_COMMUNITY): Payer: No Typology Code available for payment source

## 2019-04-13 DIAGNOSIS — Z79899 Other long term (current) drug therapy: Secondary | ICD-10-CM | POA: Insufficient documentation

## 2019-04-13 DIAGNOSIS — W228XXA Striking against or struck by other objects, initial encounter: Secondary | ICD-10-CM | POA: Diagnosis not present

## 2019-04-13 DIAGNOSIS — R05 Cough: Secondary | ICD-10-CM | POA: Insufficient documentation

## 2019-04-13 DIAGNOSIS — F17210 Nicotine dependence, cigarettes, uncomplicated: Secondary | ICD-10-CM | POA: Diagnosis not present

## 2019-04-13 DIAGNOSIS — Y9389 Activity, other specified: Secondary | ICD-10-CM | POA: Diagnosis not present

## 2019-04-13 DIAGNOSIS — R070 Pain in throat: Secondary | ICD-10-CM | POA: Diagnosis not present

## 2019-04-13 DIAGNOSIS — R197 Diarrhea, unspecified: Secondary | ICD-10-CM | POA: Diagnosis not present

## 2019-04-13 DIAGNOSIS — S0990XA Unspecified injury of head, initial encounter: Secondary | ICD-10-CM | POA: Insufficient documentation

## 2019-04-13 DIAGNOSIS — Z20828 Contact with and (suspected) exposure to other viral communicable diseases: Secondary | ICD-10-CM | POA: Diagnosis not present

## 2019-04-13 DIAGNOSIS — Y990 Civilian activity done for income or pay: Secondary | ICD-10-CM | POA: Diagnosis not present

## 2019-04-13 DIAGNOSIS — Y9289 Other specified places as the place of occurrence of the external cause: Secondary | ICD-10-CM | POA: Insufficient documentation

## 2019-04-13 DIAGNOSIS — Z91040 Latex allergy status: Secondary | ICD-10-CM | POA: Diagnosis not present

## 2019-04-13 DIAGNOSIS — M7918 Myalgia, other site: Secondary | ICD-10-CM | POA: Diagnosis not present

## 2019-04-13 DIAGNOSIS — Z23 Encounter for immunization: Secondary | ICD-10-CM | POA: Insufficient documentation

## 2019-04-13 DIAGNOSIS — Z20822 Contact with and (suspected) exposure to covid-19: Secondary | ICD-10-CM

## 2019-04-13 NOTE — ED Triage Notes (Addendum)
Pt reports that she stood up at work and hit her head on a machine at work. Reports dizziness and forgetfulness at first. Able to speak in complete sentences and no confusion at this time. She also reports sore throat, diarrhea and cough and reports COVID exposure (her mother did her laundry) since Wednesday.

## 2019-04-14 DIAGNOSIS — R05 Cough: Secondary | ICD-10-CM | POA: Diagnosis not present

## 2019-04-14 MED ORDER — TETANUS-DIPHTH-ACELL PERTUSSIS 5-2.5-18.5 LF-MCG/0.5 IM SUSP
0.5000 mL | Freq: Once | INTRAMUSCULAR | Status: AC
  Administered 2019-04-14: 01:00:00 0.5 mL via INTRAMUSCULAR
  Filled 2019-04-14: qty 0.5
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
## 2019-04-14 NOTE — Discharge Instructions (Signed)
1. Medications: Ibuprofen or Tylenol for pain 2. Treatment: Rest, ice on head.  Concussion precautions given - keep patient in a quiet, not simulating, dark environment. No TV, computer use, video games until headache is resolved completely. No contact sports until cleared by the pediatrician. 3. Follow Up: With primary care physician on Monday if headache persists.  Return to the emergency department if patient becomes lethargic, begins vomiting, develops double vision, speech difficulty, problems walking or other change in mental status.      Person Under Monitoring Name: Beth [name]  Location: [address] [place] [address]   Infection Prevention Recommendations for Individuals Confirmed to have, or Being Evaluated for, 2019 Novel Coronavirus (COVID-19) Infection Who Receive Care at Home  Individuals who are confirmed to have, or are being evaluated for, COVID-19 should follow the prevention steps below until a healthcare provider or local or state health department says they can return to normal activities.  Stay home except to get medical care You should restrict activities outside your home, except for getting medical care. Do not go to work, school, or public areas, and do not use public transportation or taxis.  Call ahead before visiting your doctor Before your medical appointment, call the healthcare provider and tell them that you have, or are being evaluated for, COVID-19 infection. This will help the healthcare providers office take steps to keep other people from getting infected. Ask your healthcare provider to call the local or state health department.  Monitor your symptoms Seek prompt medical attention if your illness is worsening (e.g., difficulty breathing). Before going to your medical appointment, call the healthcare provider and tell them that you have, or are being evaluated for, COVID-19 infection. Ask your healthcare provider to call the local  or state health department.  Wear a facemask You should wear a facemask that covers your nose and mouth when you are in the same room with other people and when you visit a healthcare provider. People who live with or visit you should also wear a facemask while they are in the same room with you.  Separate yourself from other people in your home As much as possible, you should stay in a different room from other people in your home. Also, you should use a separate bathroom, if available.  Avoid sharing household items You should not share dishes, drinking glasses, cups, eating utensils, towels, bedding, or other items with other people in your home. After using these items, you should wash them thoroughly with soap and water.  Cover your coughs and sneezes Cover your mouth and nose with a tissue when you cough or sneeze, or you can cough or sneeze into your sleeve. Throw used tissues in a lined trash can, and immediately wash your hands with soap and water for at least 20 seconds or use an alcohol-based hand rub.  Wash your [employer] your hands often and thoroughly with soap and water for at least 20 seconds. You can use an alcohol-based hand sanitizer if soap and water are not available and if your hands are not visibly dirty. Avoid touching your eyes, nose, and mouth with unwashed hands.   Prevention Steps for Caregivers and Household Members of Individuals Confirmed to have, or Being Evaluated for, COVID-19 Infection Being Cared for in the Home  If you live with, or provide care at home for, a person confirmed to have, or being evaluated for, COVID-19 infection please follow these guidelines to prevent infection:  Follow healthcare providers instructions Make sure that you understand and can help the patient follow any healthcare provider instructions for all care.  Provide for the patients basic needs You should help the patient with basic needs in the home and provide  support for getting groceries, prescriptions, and other personal needs.  Monitor the patients symptoms If they are getting sicker, call his or her medical provider and tell them that the patient has, or is being evaluated for, COVID-19 infection. This will help the healthcare providers office take steps to keep other people from getting infected. Ask the healthcare provider to call the local or state health department.  Limit the number of people who have contact with the patient If possible, have only one caregiver for the patient. Other household members should stay in another home or place of residence. If this is not possible, they should stay in another room, or be separated from the patient as much as possible. Use a separate bathroom, if available. Restrict visitors who do not have an essential need to be in the home.  Keep older adults, very young children, and other sick people away from the patient Keep older adults, very young children, and those who have compromised immune systems or chronic health conditions away from the patient. This includes people with chronic heart, lung, or kidney conditions, diabetes, and cancer.  Ensure good ventilation Make sure that shared spaces in the home have good air flow, such as from an air conditioner or an opened window, weather permitting.  Wash your hands often Wash your hands often and thoroughly with soap and water for at least 20 seconds. You can use an alcohol based hand sanitizer if soap and water are not available and if your hands are not visibly dirty. Avoid touching your eyes, nose, and mouth with unwashed hands. Use disposable paper towels to dry your hands. If not available, use dedicated cloth towels and replace them when they become wet.  Wear a facemask and gloves Wear a disposable facemask at all times in the room and gloves when you touch or have contact with the patients blood, body fluids, and/or secretions or  excretions, such as sweat, saliva, sputum, nasal mucus, vomit, urine, or feces.  Ensure the mask fits over your nose and mouth tightly, and do not touch it during use. Throw out disposable facemasks and gloves after using them. Do not reuse. Wash your hands immediately after removing your facemask and gloves. If your personal clothing becomes contaminated, carefully remove clothing and launder. Wash your hands after handling contaminated clothing. Place all used disposable facemasks, gloves, and other waste in a lined container before disposing them with other household waste. Remove gloves and wash your hands immediately after handling these items.  Do not share dishes, glasses, or other household items with the patient Avoid sharing household items. You should not share dishes, drinking glasses, cups, eating utensils, towels, bedding, or other items with a patient who is confirmed to have, or being evaluated for, COVID-19 infection. After the person uses these items, you should wash them thoroughly with soap and water.  Wash laundry thoroughly Immediately remove and wash clothes or bedding that have blood, body fluids, and/or secretions or excretions, such as sweat, saliva, sputum, nasal mucus, vomit, urine, or feces, on them. Wear gloves when handling laundry from the patient. Read and follow directions on labels of laundry or clothing items and detergent. In general, wash and dry with the warmest temperatures recommended on the label.  Clean all areas the individual has used often Clean all touchable surfaces, such as counters, tabletops, doorknobs, bathroom fixtures, toilets, phones, keyboards, tablets, and bedside tables, every day. Also, clean any surfaces that may have blood, body fluids, and/or secretions or excretions on them. Wear gloves when cleaning surfaces the patient has come in contact with. Use a diluted bleach solution (e.g., dilute bleach with 1 part bleach and 10 parts water)  or a household disinfectant with a label that says EPA-registered for coronaviruses. To make a bleach solution at home, add 1 tablespoon of bleach to 1 quart (4 cups) of water. For a larger supply, add  cup of bleach to 1 gallon (16 cups) of water. Read labels of cleaning products and follow recommendations provided on product labels. Labels contain instructions for safe and effective use of the cleaning product including precautions you should take when applying the product, such as wearing gloves or eye protection and making sure you have good ventilation during use of the product. Remove gloves and wash hands immediately after cleaning.  Monitor yourself for signs and symptoms of illness Caregivers and household members are considered close contacts, should monitor their health, and will be asked to limit movement outside of the home to the extent possible. Follow the monitoring steps for close contacts listed on the symptom monitoring form.   ? If you have additional questions, contact your local health department or call the epidemiologist on call at [phone] (available 24/7). ? This guidance is subject to change. For the most up-to-date guidance from [facility], please refer to their website: TripMet[employer].huhttps://www.cdc.gov/coronavirus/2019-ncov/hcp/guidance-prevent-spread.html

## 2019-04-15 ENCOUNTER — Encounter: Payer: Self-pay | Admitting: Family Medicine

## 2019-04-15 LAB — NOVEL CORONAVIRUS, NAA (HOSP ORDER, SEND-OUT TO REF LAB; TAT 18-24 HRS): SARS-CoV-2, NAA: NOT DETECTED

## 2019-04-18 ENCOUNTER — Ambulatory Visit (INDEPENDENT_AMBULATORY_CARE_PROVIDER_SITE_OTHER): Payer: BC Managed Care – PPO | Admitting: Family Medicine

## 2019-04-18 ENCOUNTER — Other Ambulatory Visit: Payer: Self-pay

## 2019-04-18 DIAGNOSIS — Z23 Encounter for immunization: Secondary | ICD-10-CM

## 2019-04-18 DIAGNOSIS — B349 Viral infection, unspecified: Secondary | ICD-10-CM

## 2019-04-18 DIAGNOSIS — S060X0A Concussion without loss of consciousness, initial encounter: Secondary | ICD-10-CM | POA: Diagnosis not present

## 2019-04-18 NOTE — Patient Instructions (Signed)
It was wonderful meeting you today.  You can drink hot tea with honey and take hot showers to help with your symptoms.  Additionally can use ice/heat on your neck/forehead for your headaches.  Please continue with routine follow-ups with your PCP, additionally follow-up if your headaches persist, if you have any vision changes, difficulty with balance, passing out episodes, or any weakness/numbness.

## 2019-04-18 NOTE — Progress Notes (Signed)
Subjective:    Patient ID: Beth [name], female    DOB: [birth year], 28 y.o.   MRN: [id number]   CC: ED follow-up  HPI: Beth [name] is a 28 year old female with a history of MDD, PTSD, and previous polysubstance abuse presenting discuss the following:  ED follow-up: Seen in ED on 8/31 after hitting her head on an "anesthesia machine" while at work.  No associated loss of consciousness at that time, however did feel a little "woozy ".  Noted to be neurologically intact while in the ED, no imaging performed.  Felt that she suffered from a mild concussion.  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  Requires work note.  Since being seen in the ED, she has been overall doing well. Denies feeling foggy or difficulties with focusing.  Is having occasional frontal pulsating/throbbing headaches, mainly on the right side where she hit her head.  Last up to an hour. Not everyday. Denies any associated phonophobia, photophobia, nausea, vomiting, visual changes, weakness/numbness.  These headaches have not stopped her from doing what she needs to do and not very bothered by them.  She has not used any Tylenol or ibuprofen to help with pain relief, as she is very mindful of what she takes due to her polysubstance abuse history.  Is attending [place] meetings.  Additionally noted in ED documentation that patient has been complaining of 4 days of sore throat, cough, and general myalgias.  Her mother had recently tested positive for COVID.  Tested negative for COVID on 8/30.  Denies any associated fever, loss of taste/smell, difficulty breathing/shortness of breath.  Says she still has a little bit of a sore throat and mild dry cough, however otherwise is feeling slightly better.  In discussion of exposures, says she has not seen her mother in over 2 weeks, however she did pick up her mother's laundry around August 24 (almost 2 weeks ago) when mom had already  tested positive.    Review of Systems Per HPI   Patient Active Problem List   Diagnosis Date Noted  . Mild concussion 04/20/2019  . Viral illness 04/20/2019  . MDD (major depressive disorder), recurrent episode, severe ([place]) 12/19/2018  . Dyspepsia 09/02/2018  . Pilonidal cyst without infection 03/04/2018  . Tobacco use disorder 01/11/2018  . Abnormal Pap smear of cervix 10/01/2017  . Post traumatic stress disorder (PTSD) 06/14/2016  . MDD (major depressive disorder), recurrent, severe, with psychosis ([place]) 06/05/2016  . Routine general medical examination at a health care facility 12/02/2015     Objective:  BP 90/62   Pulse (!) 111   SpO2 98%   During evaluation, heart rate approximately 90-95 bpm. Recheck blood pressure 100/68. Vitals and nursing note reviewed  General: NAD, pleasant HEENT: Mucous membranes moist, oropharynx nonerythematous.  No anterior or posterior cervical lymphadenopathy.  Small healing abrasion to upper occiput without any swelling or lacerations noted. Cardiac: RRR, normal heart sounds, no murmurs Respiratory: CTAB, normal effort, no coughing during entire visit Abdomen: soft, nontender, nondistended Extremities: no edema or cyanosis. WWP. Skin: warm and dry, no rashes noted Neuro: alert and oriented, speech normal.  CN II-XII intact.  EOMI, PERRLA.  No tenderness to palpation of cervical spinous processes.  5/5 upper and lower muscle extremity strength.  2/4 patellar and biceps reflexes bilaterally.  Sensation to light touch intact. Psych: normal affect  Assessment & Plan:   Mild concussion Following TBI on  8/30, no LOC. Now experiencing mild postconcussive headaches, but are otherwise non-bothersome to the patient.  Neurologically intact on exam today, no concerning s/sx. - Ice/heat to neck/headache location, can use Tylenol/ibuprofen as needed per patient's comfort level - Return/ED discussions discussed including any development of visual  changes, weakness/numbness, N/V etc  - Follow-up in the next few months if headaches persist or sooner if worsening - Provided work note to return on 9/7  Viral illness COVID negative on 8/30, after approximately 4-5 days of symptomatology including mild dry cough, sore throat, and myalgias.  Additionally reviewed that patient's most recent exposure was 8/24, which was mother's laundry only after mom had already tested positive for COVID.  Overall well-appearing on exam today, HEENT and pulmonary exams unremarkable without any coughing during encounter.  Reassured by COVID negative results and suspect improving symptoms are likely in the setting of viral URI. - Supportive care discussed, including tea with honey, hot showers, Tylenol - Provided work note to return on 9/7 - Return precautions discussed   Received flu shot today.  Follow-up if symptoms not improving or sooner if worsening.  Leticia PennaSamantha Lexus Barletta DO Family Medicine Resident PGY-2

## 2019-04-20 DIAGNOSIS — S060X9A Concussion with loss of consciousness of unspecified duration, initial encounter: Secondary | ICD-10-CM | POA: Insufficient documentation

## 2019-04-20 DIAGNOSIS — S060XAA Concussion with loss of consciousness status unknown, initial encounter: Secondary | ICD-10-CM | POA: Insufficient documentation

## 2019-04-20 DIAGNOSIS — B349 Viral infection, unspecified: Secondary | ICD-10-CM | POA: Insufficient documentation
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
## 2019-04-20 NOTE — Assessment & Plan Note (Signed)
COVID negative on 8/30, after approximately 4-5 days of symptomatology including mild dry cough, sore throat, and myalgias.  Additionally reviewed that patient's most recent exposure was 8/24, which was mother's laundry only after mom had already tested positive for COVID.  Overall well-appearing on exam today, HEENT and pulmonary exams unremarkable without any coughing during encounter.  Reassured by COVID negative results and suspect improving symptoms are likely in the setting of viral URI. - Supportive care discussed, including tea with honey, hot showers, Tylenol - Provided work note to return on 9/7 - Return precautions discussed

## 2019-04-24 ENCOUNTER — Encounter: Payer: Self-pay | Admitting: Family Medicine

## 2019-04-24 NOTE — Progress Notes (Signed)
Patient requested letter to return to work per request of her employer

## 2019-05-26 ENCOUNTER — Other Ambulatory Visit: Payer: Self-pay

## 2019-05-26 ENCOUNTER — Ambulatory Visit (HOSPITAL_COMMUNITY): Payer: BC Managed Care – PPO | Admitting: Psychiatry

## 2019-05-29 ENCOUNTER — Other Ambulatory Visit: Payer: Self-pay

## 2019-05-29 ENCOUNTER — Ambulatory Visit (INDEPENDENT_AMBULATORY_CARE_PROVIDER_SITE_OTHER): Payer: BC Managed Care – PPO | Admitting: Psychiatry

## 2019-05-29 ENCOUNTER — Encounter (HOSPITAL_COMMUNITY): Payer: Self-pay | Admitting: Psychiatry

## 2019-05-29 DIAGNOSIS — F1010 Alcohol abuse, uncomplicated: Secondary | ICD-10-CM | POA: Diagnosis not present

## 2019-05-29 DIAGNOSIS — Z7289 Other problems related to lifestyle: Secondary | ICD-10-CM | POA: Diagnosis not present

## 2019-05-29 DIAGNOSIS — F333 Major depressive disorder, recurrent, severe with psychotic symptoms: Secondary | ICD-10-CM | POA: Diagnosis not present

## 2019-05-29 DIAGNOSIS — F4310 Post-traumatic stress disorder, unspecified: Secondary | ICD-10-CM

## 2019-05-29 DIAGNOSIS — Z79899 Other long term (current) drug therapy: Secondary | ICD-10-CM

## 2019-05-29 MED ORDER — FLUOXETINE HCL 40 MG PO CAPS: 40.0000 mg | ORAL_CAPSULE | Freq: Every day | ORAL | 0 refills | Status: DC

## 2019-05-29 MED ORDER — HYDROXYZINE HCL 50 MG PO TABS: 50.0000 mg | ORAL_TABLET | Freq: Every day | ORAL | 0 refills | Status: DC | PRN

## 2019-05-29 MED ORDER — ARIPIPRAZOLE 2 MG PO TABS: 2.0000 mg | ORAL_TABLET | Freq: Every day | ORAL | 0 refills | Status: DC

## 2019-05-29 MED ORDER — LITHIUM CARBONATE ER 300 MG PO TBCR: EXTENDED_RELEASE_TABLET | ORAL | 0 refills | Status: DC

## 2019-05-29 NOTE — Progress Notes (Signed)
Virtual Visit via Telephone Note  I connected with Beth [name] on 05/29/19 at  8:40 AM EDT by telephone and verified that I am speaking with the correct person using two identifiers.   I discussed the limitations, risks, security and privacy concerns of performing an evaluation and management service by telephone and the availability of in person appointments. I also discussed with the patient that there may be a patient responsible charge related to this service. The patient expressed understanding and agreed to proceed.   History of Present Illness: Patient was evaluated by phone session.  She admitted having episodes of severe depression at least once a month when she feels very down and having suicidal thoughts.  She also admitted cut superficially her thigh twice in 1 month.  She is not sure what triggered these depressive thoughts but around that time she feels very lonely having crying spells and anhedonia.  She also reported that sleeping 8 to 10 hours and in the morning gets very tired.  We have recommended to take the lithium in divided doses but she forgot and is still taking lithium 900 mg at bedtime.  She also forgot blood work which was recommended on the last visit.  She admitted there are times when she skipped her medication as she cannot afford and wait for her paycheck.  Recently she was seen in the emergency room due to hitting her head on anesthesia machine while at work.  Patient works in a [employer]ch[employer], retail.  No imaging was done in the ER and now she is feeling better.  She admitted still sometimes have headaches but not as bad.  Patient admitted her work is very stressful.  She is working some time more than 40 hours a week.  She denies any paranoia, hallucination, anger or severe mood swing.  She lives by herself but she has a good support from her parents and her boyfriend.  She feels proud that she has been sober from drinking for past 4 months.  She started AA and that  helped her a lot.  However she has not able to find a therapist since she stopped therapy from [facility].  Her nightmares are not as bad and she denies lately any flashbacks.  She has no tremors, shakes or any EPS.  Early level is fair.  Past Psychiatric History:Reviewed. H/Odepression, anxiety, PTSD,cutting andsubstance use. [facility]. [place], Xanax,andAtivan. H/O inpatientin October 2017. TookCymbalta, Wellbutrin, Lexapro, Seroquel with poor outcome.TMS helped.  Recent Results (from the past 2160 hour(s))  Novel Coronavirus, NAA (Hosp order, Send-out to Ref Lab; TAT 18-24 hrs     Status: None   Collection Time: 04/13/19 11:09 PM   Specimen: Nasopharyngeal Swab; Respiratory  Result Value Ref Range   SARS-CoV-2, NAA NOT DETECTED NOT DETECTED    Comment: (NOTE) This nucleic acid amplification test was developed and its perfomance characteristics determined by [employer]. Nucleic acid amplification tests include PCR and TMA. This test has not been FDA cleared or approved. This test has been authorized by FDA under an Emergency Use Authorization (EUA). This test is only authorized for the duration of time the declaration that circumstances exist justifying the authorization of the emergency use of in vitro diagnostic tests for detection of SARS-CoV-2 virus and/or diagnosis of COVID-19 infection under section 564(b)(1) of the Act, 21 U.S.C. 854OEV-0(J) (1), unless the authorization is terminated or revoked sooner. When diagnostic testing is negative, the possibility of a false negative result should be  considered in the context of a patient's recent exposures and the presence of clinical signs and symptoms consistent with COVID-19. An individual without symptoms of COVID- 19 and who is not shedding SARS-CoV-2 vir [place] would expect to have a negative (not detected) result in this assay. Performed At: [facility] [place], [place] [id number] Rush Farmer MD IP:[id number]    Coronavirus Source NASOPHARYNGEAL     Comment: Performed at [facility] [address], [place], [place] [address]      Psychiatric Specialty Exam: Physical Exam  ROS  There were no vitals taken for this visit.There is no height or weight on file to calculate BMI.  General Appearance: NA  Eye Contact:  NA  Speech:  Clear and Coherent and Slow  Volume:  Normal  Mood:  Anxious and Dysphoric  Affect:  NA  Thought Process:  Goal Directed  Orientation:  Full (Time, Place, and Person)  Thought Content:  Rumination  Suicidal Thoughts:  No  Homicidal Thoughts:  No  Memory:  Immediate;   Good Recent;   Good Remote;   Good  Judgement:  Good  Insight:  Present  Psychomotor Activity:  NA  Concentration:  Concentration: Good and Attention Span: Good  Recall:  Good  Fund of Knowledge:  Good  Language:  Good  Akathisia:  No  Handed:  Right  AIMS (if indicated):     Assets:  Communication Skills Desire for Improvement Housing Resilience  ADL's:  Intact  Cognition:  WNL  Sleep:   too much      Assessment and Plan: Major depressive disorder, recurrent.  Alcohol abuse in partial remission.  Posttraumatic stress disorder.  Self abusive behavior.  Discuss compliance issue and strongly encouraged that she should take the medicine on a regular basis.  Discussed her cutting which is superficially and does not require acute or emergency help.  I do believe she should see a therapist to address her symptoms and coping skills.  Her drinking is stopped and she has been sober for 4 months.  I recommend 1 more time that she should not take lithium hydroxyzine and trazodone all at bedtime.  Reminded that she should take lithium in divided doses then take the hydroxyzine and if she cannot sleep then she can take trazodone.  I will also add low-dose Abilify to help her depression.   Encouraged to continue with AA which is helping her.  We will also do blood work and she promised that she can come tomorrow for lithium level.  We will provide names of the therapist who she can call to schedule appointment.  Discussed safety concerns and anytime having active suicidal thoughts or homicidal thought that she need to call 911 or go to local emergency room.  I also told that if she noticed that her headaches are persistent then she may need to see neurology.  Follow-up in 3 to 4 weeks.  Time spent 30 minutes.  Follow Up Instructions:    I discussed the assessment and treatment plan with the patient. The patient was provided an opportunity to ask questions and all were answered. The patient agreed with the plan and demonstrated an understanding of the instructions.   The patient was advised to call back or seek an in-person evaluation if the symptoms worsen or if the condition fails to improve as anticipated.  I provided 30 minutes of non-face-to-face time during this encounter.   Kathlee Nations, MD

## 2019-05-30 ENCOUNTER — Other Ambulatory Visit (HOSPITAL_COMMUNITY): Payer: Self-pay

## 2019-05-30 DIAGNOSIS — Z79899 Other long term (current) drug therapy: Secondary | ICD-10-CM | POA: Diagnosis not present

## 2019-05-31 LAB — HEMOGLOBIN A1C
Est. average glucose Bld gHb Est-mCnc: 105 mg/dL
Hgb A1c MFr Bld: 5.3 % (ref 4.8–5.6)

## 2019-05-31 LAB — LITHIUM LEVEL: Lithium Lvl: 0.3 mmol/L — ABNORMAL LOW (ref 0.6–1.2)

## 2019-06-19 ENCOUNTER — Other Ambulatory Visit: Payer: Self-pay

## 2019-06-19 ENCOUNTER — Ambulatory Visit (HOSPITAL_COMMUNITY): Payer: BC Managed Care – PPO | Admitting: Psychiatry

## 2019-06-26 ENCOUNTER — Ambulatory Visit (INDEPENDENT_AMBULATORY_CARE_PROVIDER_SITE_OTHER): Payer: BC Managed Care – PPO | Admitting: Psychiatry

## 2019-06-26 ENCOUNTER — Telehealth (HOSPITAL_COMMUNITY): Payer: Self-pay | Admitting: Licensed Clinical Social Worker

## 2019-06-26 ENCOUNTER — Encounter (HOSPITAL_COMMUNITY): Payer: Self-pay | Admitting: Psychiatry

## 2019-06-26 ENCOUNTER — Other Ambulatory Visit: Payer: Self-pay

## 2019-06-26 DIAGNOSIS — Z7289 Other problems related to lifestyle: Secondary | ICD-10-CM

## 2019-06-26 DIAGNOSIS — F1010 Alcohol abuse, uncomplicated: Secondary | ICD-10-CM

## 2019-06-26 DIAGNOSIS — F4310 Post-traumatic stress disorder, unspecified: Secondary | ICD-10-CM

## 2019-06-26 DIAGNOSIS — F333 Major depressive disorder, recurrent, severe with psychotic symptoms: Secondary | ICD-10-CM | POA: Diagnosis not present
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
## 2019-06-27 NOTE — Telephone Encounter (Signed)
Clinician contacted client at referral of psychiatrist. Client states unable to make CDIOP time. Client is open to outpatient therapy to address both mental health and substance use concerns. Client scheduled for OPT with K Moris Ratchford, LCSW

## 2019-07-03 ENCOUNTER — Telehealth (INDEPENDENT_AMBULATORY_CARE_PROVIDER_SITE_OTHER): Payer: BC Managed Care – PPO | Admitting: Licensed Clinical Social Worker

## 2019-07-03 ENCOUNTER — Ambulatory Visit (HOSPITAL_COMMUNITY): Payer: BC Managed Care – PPO | Admitting: Licensed Clinical Social Worker

## 2019-07-03 NOTE — Telephone Encounter (Signed)
Clinician was unable to reach client via WebEx meeting or phone contact listed in record. Clinician let voicemail requesting return contact with phone information for clinician direct line and front desk with the option to re-schedule assessment.

## 2019-07-04 ENCOUNTER — Telehealth (HOSPITAL_COMMUNITY): Payer: Self-pay

## 2019-07-04 ENCOUNTER — Other Ambulatory Visit: Payer: Self-pay

## 2019-07-04 ENCOUNTER — Telehealth: Payer: Self-pay | Admitting: Family Medicine

## 2019-07-04 ENCOUNTER — Telehealth (INDEPENDENT_AMBULATORY_CARE_PROVIDER_SITE_OTHER): Payer: BC Managed Care – PPO | Admitting: Family Medicine

## 2019-07-04 DIAGNOSIS — B9789 Other viral agents as the cause of diseases classified elsewhere: Secondary | ICD-10-CM | POA: Diagnosis not present

## 2019-07-04 DIAGNOSIS — J988 Other specified respiratory disorders: Secondary | ICD-10-CM

## 2019-07-04 DIAGNOSIS — Z20822 Contact with and (suspected) exposure to covid-19: Secondary | ICD-10-CM

## 2019-07-04 NOTE — Telephone Encounter (Signed)
Pt had a virtual call today in ATC. We advised her to self quarantine herself. She explained this to her employer and they do not believe her and would like [place] to fax a letter stating this information from our office and company letter head signed by the doctor.   Please fax to [employer] [phone] attention Shauna

## 2019-07-04 NOTE — Telephone Encounter (Addendum)
Post virtual visit 07/04/2019 at 1125, patient stated that she would like for [facility] to mail her a copy of letter.  This was done.  A copy has been faxed to patient's job.  Ozella Almond, [place]

## 2019-07-04 NOTE — Progress Notes (Signed)
[place] Telemedicine Visit  Patient consented to have virtual visit. Method of visit: Video  Encounter participants: Patient: Beth [name] - located at home Provider: Danna Hefty - located at [facility] Others (if applicable): None  Chief Complaint: Sore throat, SOB, body aches  HPI: Sore throat, SOB, body aches since Sunday 11/15. She also had some vomiting 2-3x yesterday, just remnants of what she ate. She also endorses soft stools however this is chronic for her and is unchanged from her normal. She was exposed to COVID about 2 weeks ago. She was tested for COVID 8/30 and she was negative. Tolerating PO and having normal voids. Notes she is getting more winded when she ambulates and does feel more short of breath than normal at rest. She also endorses nonproductive cough and congestion. Denies any changes in smell or taste. She works at a [employer] - she works 3rd shift. One of her coworkers was confirmed positive for COVID and is out sick this week.   ROS: per HPI  Pertinent PMHx: None  Exam:  Gen: Well appearing female although appears tired, lying comfortably in bed in her room  Respiratory: Breathing comfortably on room air, Speaking in full sentences  Assessment/Plan:  Viral respiratory illness Signs and symptoms appear most consistent with viral illness. High suspicioun for COVID given symptoms and recent exposure. Some concern for her SOB that she endorses, however patient is well appearing and breathign comfortable on exam which is very reassuring. No symptoms consistent with pneumonia at this time. Will have patient get tested for COVID at [facility] ED precautions discussed at length including any worsening SOB, productive cough, inability to maintain adequate hydration. Patient voiced understanding and agreement to plan. CDC Quarantine Guidelines discussed at length.   [id number] for COVID testing placed.  Patient advised of 3 locations for drive up  testing including:  [facility] (old [facility] in [place]) [place]. [place], [place] [address]  [place] ([place]) [address] [place], [place] [address]  [address] (near [place] in [place]) [place], [place] [address]  Patient advised of hours 8am-4pm and that they should be in line by 3 PM.  -Strict ED precautions discussed and patient expressed good understanding.  -Patient counseled on wearing a mask, washing hands -Patient instructed to avoid others until she meets criteria for ending isolation after any suspected COVID, which are:  -3 days with no fever and  -Respiratory symptoms have improved (e.g. cough, shortness of breath) or  -14 days since symptoms first appeared    Time spent during visit with patient: 21 minutes  Mina Marble, DO [place], PGY2 07/04/19

## 2019-07-04 NOTE — Telephone Encounter (Signed)
Patients father called and left a voicemail. We do no have permission to speak with him so I did not call him back, however he left a detailed message. He states that patient is drinking heavily and daily, she is cutting a lot and has several cuts that are the length of her arm and she is having severe and fast mood swings. I looked at your last note and it looks like she turned down the CDIOP and she no showed her appointment with Oneita Kras. I did call patient just to see how she was doing and she said that she was willing to reschedule with Oneita Kras, but otherwise she was fine...please review and advise, thank you

## 2019-07-04 NOTE — Telephone Encounter (Signed)
We can call father back and recommended to call 911 or take her to ER if he feels patient in danger.

## 2019-07-05 DIAGNOSIS — B9789 Other viral agents as the cause of diseases classified elsewhere: Secondary | ICD-10-CM | POA: Insufficient documentation

## 2019-07-05 NOTE — Assessment & Plan Note (Signed)
Signs and symptoms appear most consistent with viral illness. High suspicioun for COVID given symptoms and recent exposure. Some concern for her SOB that she endorses, however patient is well appearing and breathign comfortable on exam which is very reassuring. No symptoms consistent with pneumonia at this time. Will have patient get tested for COVID at [facility]. ED precautions discussed at length including any worsening SOB, productive cough, inability to maintain adequate hydration. Patient voiced understanding and agreement to plan. CDC Quarantine Guidelines discussed at length.   [id number] for COVID testing placed.  Patient advised of 3 locations for drive up testing including:  [facility] (old [facility] in [place]) [address] [place], [place] [address]  [facility] ([place]) [address] [place], [place] [address]  [address] (near [address] in [place]) [place], [place] [address]  Patient advised of hours 8am-4pm and that they should be in line by 3 PM.  -Strict ED precautions discussed and patient expressed good understanding.  -Patient counseled on wearing a mask, washing hands -Patient instructed to avoid others until she meets criteria for ending isolation after any suspected COVID, which are:  -3 days with no fever and  -Respiratory symptoms have improved (e.g. cough, shortness of breath) or  -14 days since symptoms first appeared

## 2019-07-07 LAB — NOVEL CORONAVIRUS, NAA: SARS-CoV-2, NAA: NOT DETECTED

## 2019-07-14 ENCOUNTER — Telehealth (HOSPITAL_COMMUNITY): Payer: Self-pay

## 2019-07-14 DIAGNOSIS — F1010 Alcohol abuse, uncomplicated: Secondary | ICD-10-CM

## 2019-07-14 DIAGNOSIS — F333 Major depressive disorder, recurrent, severe with psychotic symptoms: Secondary | ICD-10-CM

## 2019-07-14 DIAGNOSIS — F4310 Post-traumatic stress disorder, unspecified: Secondary | ICD-10-CM

## 2019-07-14 NOTE — Telephone Encounter (Signed)
Medication refill - Fax for refill of pt's Trazodone received.  No current follow up appt scheduled.

## 2019-07-15 NOTE — Telephone Encounter (Signed)
Did she agree for CDIOP? She has been drinking heavily.

## 2019-07-16 NOTE — Telephone Encounter (Signed)
Medication management - Message left for pt trying to contact her about medication needs and if she would be starting CDIOP as pt missed appt with Micheline Chapman 07/03/19 and appt with Dr. Adele Schilder 06/19/19 and 05/26/19. Requested pt call back to discuss.

## 2019-07-16 NOTE — Telephone Encounter (Signed)
She is not consistent with our treatment plan.  We may need to refer her out if she did not call us back.

## 2019-07-18 ENCOUNTER — Telehealth (HOSPITAL_COMMUNITY): Payer: Self-pay | Admitting: Licensed Clinical Social Worker

## 2019-07-18 NOTE — Telephone Encounter (Signed)
Returned client call. VM left for client to discuss CDIOP or other SA Tx options

## 2019-07-18 NOTE — Telephone Encounter (Signed)
If she don't call back. Please send termination letter for non-compliance with treatment plan.

## 2019-07-21 ENCOUNTER — Telehealth (HOSPITAL_COMMUNITY): Payer: Self-pay | Admitting: *Deleted

## 2019-07-21 NOTE — Telephone Encounter (Signed)
She is inconsistent with our treatment plan and recommendation.  I have recommended to referred outside.  Please check with the front desk if they have sent the letter.  Thanks

## 2019-07-21 NOTE — Telephone Encounter (Signed)
Pt left message asking for Trazadone refill. Is she still pt in practice? She has no upcoming appointments. Please review and advise.

## 2019-07-22 ENCOUNTER — Other Ambulatory Visit: Payer: Self-pay

## 2019-07-22 ENCOUNTER — Telehealth (INDEPENDENT_AMBULATORY_CARE_PROVIDER_SITE_OTHER): Payer: BC Managed Care – PPO | Admitting: Family Medicine

## 2019-07-22 DIAGNOSIS — R6889 Other general symptoms and signs: Secondary | ICD-10-CM | POA: Diagnosis not present

## 2019-07-22 NOTE — Assessment & Plan Note (Signed)
Differential includes coronavirus infection, influenza infection, upper respiratory viral infection.  She is recommended to have the COVID-19 test performed soon as possible (testing center not open until tomorrow).  If the coronavirus test is negative I continue to have a high suspicion for influenza infection.  She is now far outside the window for having any benefit from Tamiflu. -Instructed to have COVID-19 test performed -Work note sent -We will follow-up following Covid test

## 2019-07-22 NOTE — Progress Notes (Signed)
[facility] Telemedicine Visit  Patient consented to have virtual visit. Method of visit: Video  Encounter participants: Patient: Beth [name] - located at home Provider: Matilde Haymaker - located at [facility] Others (if applicable): none  Chief Complaint: Flulike symptoms  HPI: Flulike symptoms [name] reports flulike symptoms for the past 5 to 6 days.  Her symptoms include general fatigue, body aches, chills, coughing with minimal yellow sputum production and nasal congestion.  She also notes that she becomes winded easily with mild exertion.  She appeared for work 2 days ago and was told to go home and be assessed by physician before returning to work because of her constellation of symptoms.  She has had multiple Covid tests in the past year most recently on 11/20 which was negative.  No known Covid exposure.  She is able to breathe comfortably at rest but does get winded easily on exertion.  She denies fevers.  She is requested that I not be provided for her work can be faxed today.  Fax for work: [phone]  ROS: per HPI  Pertinent PMHx: Multiple encounters for viral upper respiratory tract infections in the past several months  Exam:  Respiratory: Able to breathe comfortably on room air and able to complete long sentences without effort.  Assessment/Plan:  Influenza-like symptoms Differential includes coronavirus infection, influenza infection, upper respiratory viral infection.  She is recommended to have the COVID-19 test performed soon as possible (testing center not open until tomorrow).  If the coronavirus test is negative I continue to have a high suspicion for influenza infection.  She is now far outside the window for having any benefit from Tamiflu. -Instructed to have COVID-19 test performed -Work note sent -We will follow-up following Covid test    Time spent during visit with patient: 15 minutes

## 2019-07-22 NOTE — Telephone Encounter (Signed)
Client left VM with multiple people on 07/21/19. Clinician return contact requesting call back to discuss and assess for appropriateness of CDIOP.

## 2019-07-23 ENCOUNTER — Other Ambulatory Visit: Payer: Self-pay

## 2019-07-23 DIAGNOSIS — Z20822 Contact with and (suspected) exposure to covid-19: Secondary | ICD-10-CM

## 2019-07-24 MED ORDER — TRAZODONE HCL 50 MG PO TABS: 50.0000 mg | ORAL_TABLET | Freq: Every evening | ORAL | 0 refills | Status: DC | PRN

## 2019-07-24 MED ORDER — LITHIUM CARBONATE ER 300 MG PO TBCR: EXTENDED_RELEASE_TABLET | ORAL | 0 refills | Status: DC

## 2019-07-24 MED ORDER — FLUOXETINE HCL 40 MG PO CAPS: 40.0000 mg | ORAL_CAPSULE | Freq: Every day | ORAL | 0 refills | Status: DC

## 2019-07-24 NOTE — Telephone Encounter (Signed)
Spoke with client yesterday afternoon. Client not interested in [place], only regular OPT

## 2019-07-24 NOTE — Telephone Encounter (Signed)
I send 30 days supply of lithium, trazodone and Prozac to her pharmacy. As per chart she is not taking Abilify.

## 2019-07-25 ENCOUNTER — Telehealth: Payer: Self-pay | Admitting: Family Medicine

## 2019-07-25 LAB — NOVEL CORONAVIRUS, NAA: SARS-CoV-2, NAA: NOT DETECTED

## 2019-07-25 NOTE — Telephone Encounter (Signed)
Patient had COVID test 07-23-2019 and it just came back negative. Patient needs a doctors note to return back to work on Sunday.   Please fax the note to [place] at [phone] and at attention to [place].

## 2019-07-25 NOTE — Telephone Encounter (Signed)
Called patient.  Informed of negative Covid test.  Work note faxed saying okay to return to work on 12/13.  Matilde Haymaker, MD

## 2019-07-28 ENCOUNTER — Ambulatory Visit (INDEPENDENT_AMBULATORY_CARE_PROVIDER_SITE_OTHER): Payer: BC Managed Care – PPO | Admitting: Licensed Clinical Social Worker

## 2019-07-28 DIAGNOSIS — F4310 Post-traumatic stress disorder, unspecified: Secondary | ICD-10-CM

## 2019-07-28 DIAGNOSIS — F333 Major depressive disorder, recurrent, severe with psychotic symptoms: Secondary | ICD-10-CM

## 2019-07-28 NOTE — Progress Notes (Signed)
PT did not show or call for appointment.  Beth [name].

## 2019-07-29 ENCOUNTER — Ambulatory Visit (HOSPITAL_COMMUNITY): Payer: BC Managed Care – PPO | Admitting: Licensed Clinical Social Worker

## 2019-07-29 ENCOUNTER — Telehealth (HOSPITAL_COMMUNITY): Payer: Self-pay | Admitting: Licensed Clinical Social Worker

## 2019-07-29 ENCOUNTER — Other Ambulatory Visit: Payer: Self-pay

## 2019-07-31 ENCOUNTER — Ambulatory Visit (HOSPITAL_COMMUNITY): Payer: BC Managed Care – PPO | Admitting: Licensed Clinical Social Worker

## 2019-08-01 ENCOUNTER — Ambulatory Visit (HOSPITAL_COMMUNITY): Payer: BC Managed Care – PPO | Admitting: Licensed Clinical Social Worker

## 2019-08-01 ENCOUNTER — Telehealth (HOSPITAL_COMMUNITY): Payer: Self-pay | Admitting: Licensed Clinical Social Worker

## 2019-08-06 ENCOUNTER — Telehealth (HOSPITAL_COMMUNITY): Payer: Self-pay | Admitting: Licensed Clinical Social Worker

## 2019-08-11 NOTE — Telephone Encounter (Signed)
Multiple attempts made to engage client in [place] with no return contact for times/dates offered on multiple occassions. Client recommended to be referred to alternate provider for CDIOP. Primary psychiatrist will be notified

## 2019-08-15 DIAGNOSIS — F1010 Alcohol abuse, uncomplicated: Secondary | ICD-10-CM

## 2019-08-15 HISTORY — DX: Alcohol abuse, uncomplicated: F10.10

## 2019-08-19 ENCOUNTER — Telehealth (HOSPITAL_COMMUNITY): Payer: Self-pay | Admitting: Licensed Clinical Social Worker

## 2019-08-19 ENCOUNTER — Ambulatory Visit (HOSPITAL_COMMUNITY): Payer: BC Managed Care – PPO | Admitting: Licensed Clinical Social Worker

## 2019-08-26 ENCOUNTER — Encounter (HOSPITAL_COMMUNITY): Payer: Self-pay | Admitting: Psychiatry

## 2019-09-17 ENCOUNTER — Ambulatory Visit: Payer: BC Managed Care – PPO

## 2019-10-20 DIAGNOSIS — K625 Hemorrhage of anus and rectum: Secondary | ICD-10-CM | POA: Diagnosis not present

## 2019-10-27 DIAGNOSIS — F3341 Major depressive disorder, recurrent, in partial remission: Secondary | ICD-10-CM | POA: Diagnosis not present

## 2019-11-07 ENCOUNTER — Other Ambulatory Visit: Payer: Self-pay

## 2019-11-07 ENCOUNTER — Ambulatory Visit (INDEPENDENT_AMBULATORY_CARE_PROVIDER_SITE_OTHER): Payer: BC Managed Care – PPO | Admitting: Family Medicine

## 2019-11-07 ENCOUNTER — Encounter: Payer: Self-pay | Admitting: Family Medicine

## 2019-11-07 VITALS — BP 110/60 | HR 85 | Ht 62.0 in | Wt 178.0 lb

## 2019-11-07 DIAGNOSIS — F1010 Alcohol abuse, uncomplicated: Secondary | ICD-10-CM

## 2019-11-07 DIAGNOSIS — F333 Major depressive disorder, recurrent, severe with psychotic symptoms: Secondary | ICD-10-CM | POA: Diagnosis not present

## 2019-11-07 DIAGNOSIS — F419 Anxiety disorder, unspecified: Secondary | ICD-10-CM

## 2019-11-07 DIAGNOSIS — K625 Hemorrhage of anus and rectum: Secondary | ICD-10-CM

## 2019-11-07 DIAGNOSIS — F4310 Post-traumatic stress disorder, unspecified: Secondary | ICD-10-CM

## 2019-11-07 MED ORDER — BUSPIRONE HCL 10 MG PO TABS: 10.0000 mg | ORAL_TABLET | Freq: Three times a day (TID) | ORAL | 2 refills | Status: DC

## 2019-11-07 MED ORDER — PROPRANOLOL HCL 10 MG PO TABS: 10.0000 mg | ORAL_TABLET | Freq: Two times a day (BID) | ORAL | 2 refills | Status: DC

## 2019-11-07 MED ORDER — ARIPIPRAZOLE (SENSOR) 5 MG PO TABS: 5.0000 mg | ORAL_TABLET | Freq: Every day | ORAL | 2 refills | Status: DC

## 2019-11-07 MED ORDER — HYDROXYZINE HCL 50 MG PO TABS: 50.0000 mg | ORAL_TABLET | Freq: Every day | ORAL | 0 refills | Status: DC | PRN

## 2019-11-07 MED ORDER — DOCUSATE SODIUM 100 MG PO CAPS: 100.0000 mg | ORAL_CAPSULE | Freq: Two times a day (BID) | ORAL | 1 refills | Status: AC | PRN

## 2019-11-07 MED ORDER — TRAZODONE HCL 50 MG PO TABS: 50.0000 mg | ORAL_TABLET | Freq: Every evening | ORAL | 2 refills | Status: DC | PRN

## 2019-11-07 MED ORDER — FLUOXETINE HCL 40 MG PO CAPS: 40.0000 mg | ORAL_CAPSULE | Freq: Every day | ORAL | 2 refills | Status: DC

## 2019-11-07 NOTE — Patient Instructions (Signed)
It was great to see you today! Thank you for letting me participate in your care!  Today, we discussed your anxiety and depression and I have refilled your medications. Please keep your appointment with your Psychiatrist on Monday. I am glad the medications seem to be working for you. Please don't hesitate to reach out to [place] and let us know if you are struggling or just need some extra help. We are always here to listen to you and help and support you in any way we can. Stay safe!  Be well, Jules Schick, DO PGY-3, [facility]

## 2019-11-07 NOTE — Progress Notes (Signed)
    SUBJECTIVE:   CHIEF COMPLAINT / HPI:   Anxiety and Depression Follow up Beth [name] is here following a recent stay in a rehab center and states she has remained sober. She presents today for follow up for her depression and anxiety as she states they made some medication changes when she was in the rehab center. She states overall her anxiety and depression feel well controlled. She has no thoughts of ending her life or causing harm to herself or others.   GAD-7 is 14. She feels like her normal self but is still struggling at time with anxiety. She states she has an appointment with her Psychiatrist on Monday and just needs refills of what she was on during her stay in rehab. She brought her list of medications she was taking and I have refilled those medications for her.  PERTINENT  PMH / PSH: Depression, Anxiety, transgender  OBJECTIVE:   BP 110/60   Pulse 85   Ht 5\' 2"  (1.575 m)   Wt 178 lb (80.7 kg)   SpO2 97%   BMI 32.56 kg/m   Gen: Alert and Oriented x 3, NAD CV: RRR, no murmurs, normal S1, S2 split Resp: CTAB, no wheezing, rales, or rhonchi, comfortable work of breathing Skin: warm, dry, intact, no rashes Psych: normal mood and affect, appropriate behavior  ASSESSMENT/PLAN:   MDD (major depressive disorder), recurrent, severe, with psychosis (HCC) Denies and SI or HI today. Depression seems well controlled. Keep appointment with Psychiatry on Monday, 3/29 - Fluoxetine 40mg  daily - Buspar 10mg  TID - Aripiprazole 5mg  QHS  Anxiety GAD-7 score of 14 but stable. Patient did not feel like she needed to be on more medications and felt like anxiety is getting better and she is able to go to work and do ADLs. Keep appointment with Psychiatry. - cont Buspar 10mg  TID - cont Atarax 50mg  daily PRN - Cont propranolol 10mg  BID - Cont trazodone 50mg  QHS     4/29, DO [place] [facility]

## 2019-11-10 DIAGNOSIS — F419 Anxiety disorder, unspecified: Secondary | ICD-10-CM | POA: Insufficient documentation

## 2019-11-10 DIAGNOSIS — F3341 Major depressive disorder, recurrent, in partial remission: Secondary | ICD-10-CM | POA: Diagnosis not present

## 2019-11-10 NOTE — Assessment & Plan Note (Signed)
Denies and SI or HI today. Depression seems well controlled. Keep appointment with Psychiatry on Monday, 3/29 - Fluoxetine 40mg  daily - Buspar 10mg  TID - Aripiprazole 5mg  QHS

## 2019-11-10 NOTE — Assessment & Plan Note (Signed)
GAD-7 score of 14 but stable. Patient did not feel like she needed to be on more medications and felt like anxiety is getting better and she is able to go to work and do ADLs. Keep appointment with Psychiatry. - cont Buspar 10mg  TID - cont Atarax 50mg  daily PRN - Cont propranolol 10mg  BID - Cont trazodone 50mg  QHS

## 2019-11-18 DIAGNOSIS — F3341 Major depressive disorder, recurrent, in partial remission: Secondary | ICD-10-CM | POA: Diagnosis not present

## 2019-11-21 DIAGNOSIS — F3341 Major depressive disorder, recurrent, in partial remission: Secondary | ICD-10-CM | POA: Diagnosis not present

## 2019-12-01 ENCOUNTER — Other Ambulatory Visit: Payer: Self-pay

## 2019-12-01 ENCOUNTER — Encounter (HOSPITAL_COMMUNITY): Payer: Self-pay

## 2019-12-01 ENCOUNTER — Inpatient Hospital Stay (HOSPITAL_COMMUNITY)
Admission: EM | Admit: 2019-12-01 | Discharge: 2019-12-04 | DRG: 896 | Disposition: A | Discharge status: Home / Self Care | Payer: BC Managed Care – PPO | Attending: Internal Medicine | Admitting: Internal Medicine

## 2019-12-01 ENCOUNTER — Ambulatory Visit (HOSPITAL_COMMUNITY)
Admission: AD | Admit: 2019-12-01 | Discharge: 2019-12-01 | Disposition: A | Discharge status: Home / Self Care | Payer: BC Managed Care – PPO | Source: Home / Self Care | Attending: Psychiatry | Admitting: Psychiatry

## 2019-12-01 DIAGNOSIS — F1010 Alcohol abuse, uncomplicated: Secondary | ICD-10-CM

## 2019-12-01 DIAGNOSIS — G92 Toxic encephalopathy: Secondary | ICD-10-CM | POA: Diagnosis present

## 2019-12-01 DIAGNOSIS — F4310 Post-traumatic stress disorder, unspecified: Secondary | ICD-10-CM | POA: Diagnosis present

## 2019-12-01 DIAGNOSIS — E872 Acidosis, unspecified: Secondary | ICD-10-CM

## 2019-12-01 DIAGNOSIS — F10129 Alcohol abuse with intoxication, unspecified: Secondary | ICD-10-CM | POA: Insufficient documentation

## 2019-12-01 DIAGNOSIS — F333 Major depressive disorder, recurrent, severe with psychotic symptoms: Secondary | ICD-10-CM

## 2019-12-01 DIAGNOSIS — Z91410 Personal history of adult physical and sexual abuse: Secondary | ICD-10-CM

## 2019-12-01 DIAGNOSIS — F10939 Alcohol use, unspecified with withdrawal, unspecified: Secondary | ICD-10-CM | POA: Diagnosis present

## 2019-12-01 DIAGNOSIS — Z79899 Other long term (current) drug therapy: Secondary | ICD-10-CM

## 2019-12-01 DIAGNOSIS — Z915 Personal history of self-harm: Secondary | ICD-10-CM

## 2019-12-01 DIAGNOSIS — F1020 Alcohol dependence, uncomplicated: Secondary | ICD-10-CM | POA: Diagnosis present

## 2019-12-01 DIAGNOSIS — F17210 Nicotine dependence, cigarettes, uncomplicated: Secondary | ICD-10-CM | POA: Diagnosis present

## 2019-12-01 DIAGNOSIS — F10239 Alcohol dependence with withdrawal, unspecified: Principal | ICD-10-CM | POA: Diagnosis present

## 2019-12-01 DIAGNOSIS — Z793 Long term (current) use of hormonal contraceptives: Secondary | ICD-10-CM

## 2019-12-01 DIAGNOSIS — R45851 Suicidal ideations: Secondary | ICD-10-CM | POA: Diagnosis not present

## 2019-12-01 DIAGNOSIS — Z20822 Contact with and (suspected) exposure to covid-19: Secondary | ICD-10-CM | POA: Diagnosis present

## 2019-12-01 DIAGNOSIS — R000 Tachycardia, unspecified: Secondary | ICD-10-CM | POA: Diagnosis not present

## 2019-12-01 DIAGNOSIS — Z9114 Patient's other noncompliance with medication regimen: Secondary | ICD-10-CM

## 2019-12-01 DIAGNOSIS — F10229 Alcohol dependence with intoxication, unspecified: Secondary | ICD-10-CM | POA: Diagnosis present

## 2019-12-01 DIAGNOSIS — F332 Major depressive disorder, recurrent severe without psychotic features: Secondary | ICD-10-CM | POA: Diagnosis present

## 2019-12-01 DIAGNOSIS — E860 Dehydration: Secondary | ICD-10-CM | POA: Diagnosis present

## 2019-12-01 LAB — COMPREHENSIVE METABOLIC PANEL
ALT: 49 U/L — ABNORMAL HIGH (ref 0–44)
AST: 45 U/L — ABNORMAL HIGH (ref 15–41)
Albumin: 4.7 g/dL (ref 3.5–5.0)
Alkaline Phosphatase: 105 U/L (ref 38–126)
Anion gap: 19 — ABNORMAL HIGH (ref 5–15)
BUN: 7 mg/dL (ref 6–20)
CO2: 19 mmol/L — ABNORMAL LOW (ref 22–32)
Calcium: 8.7 mg/dL — ABNORMAL LOW (ref 8.9–10.3)
Chloride: 101 mmol/L (ref 98–111)
Creatinine, Ser: 0.63 mg/dL (ref 0.44–1.00)
GFR calc Af Amer: >60 mL/min (ref >60)
GFR calc non Af Amer: >60 mL/min (ref >60)
Glucose, Bld: 113 mg/dL — ABNORMAL HIGH (ref 70–99)
Potassium: 3.9 mmol/L (ref 3.5–5.1)
Sodium: 139 mmol/L (ref 135–145)
Total Bilirubin: 0.8 mg/dL (ref 0.3–1.2)
Total Protein: 8.2 g/dL — ABNORMAL HIGH (ref 6.5–8.1)

## 2019-12-01 LAB — CBC
HCT: 42.9 % (ref 36.0–46.0)
Hemoglobin: 14.4 g/dL (ref 12.0–15.0)
MCH: 28.8 pg (ref 26.0–34.0)
MCHC: 33.6 g/dL (ref 30.0–36.0)
MCV: 85.8 fL (ref 80.0–100.0)
Platelets: 484 10*3/uL — ABNORMAL HIGH (ref 150–400)
RBC: 5 MIL/uL (ref 3.87–5.11)
RDW: 14.5 % (ref 11.5–15.5)
WBC: 16 10*3/uL — ABNORMAL HIGH (ref 4.0–10.5)
nRBC: 0 % (ref 0.0–0.2)

## 2019-12-01 LAB — RAPID URINE DRUG SCREEN, HOSP PERFORMED
Amphetamines: NOT DETECTED
Barbiturates: NOT DETECTED
Benzodiazepines: NOT DETECTED
Cocaine: NOT DETECTED
Opiates: NOT DETECTED
Tetrahydrocannabinol: NOT DETECTED

## 2019-12-01 LAB — I-STAT BETA HCG BLOOD, ED (MC, WL, AP ONLY): I-stat hCG, quantitative: <5 m[IU]/mL (ref <5)

## 2019-12-01 LAB — ETHANOL: Alcohol, Ethyl (B): 221 mg/dL — ABNORMAL HIGH (ref <10)

## 2019-12-01 MED ORDER — LORAZEPAM 2 MG/ML IJ SOLN
0.0000 mg | Freq: Four times a day (QID) | INTRAMUSCULAR | Status: AC
  Administered 2019-12-02 – 2019-12-03 (×2): 2 mg via INTRAVENOUS
  Filled 2019-12-01 (×2): qty 1

## 2019-12-01 MED ORDER — THIAMINE HCL 100 MG/ML IJ SOLN: 100.0000 mg | Freq: Every day | INTRAMUSCULAR | Status: DC

## 2019-12-01 MED ORDER — LORAZEPAM 1 MG PO TABS
0.0000 mg | ORAL_TABLET | Freq: Four times a day (QID) | ORAL | Status: AC
  Administered 2019-12-01: 1 mg via ORAL
  Administered 2019-12-02 (×2): 2 mg via ORAL
  Administered 2019-12-03: 1 mg via ORAL
  Filled 2019-12-01 (×2): qty 1
  Filled 2019-12-01 (×2): qty 2

## 2019-12-01 MED ORDER — SODIUM CHLORIDE 0.9 % IV BOLUS
1000.0000 mL | Freq: Once | INTRAVENOUS | Status: AC
  Administered 2019-12-01: 1000 mL via INTRAVENOUS

## 2019-12-01 MED ORDER — LORAZEPAM 2 MG/ML IJ SOLN: 0.0000 mg | Freq: Two times a day (BID) | INTRAMUSCULAR | Status: DC

## 2019-12-01 MED ORDER — LORAZEPAM 1 MG PO TABS: 0.0000 mg | ORAL_TABLET | Freq: Two times a day (BID) | ORAL | Status: DC

## 2019-12-01 MED ORDER — THIAMINE HCL 100 MG PO TABS
100.0000 mg | ORAL_TABLET | Freq: Every day | ORAL | Status: DC
  Administered 2019-12-02: 100 mg via ORAL
  Filled 2019-12-01: qty 1

## 2019-12-01 NOTE — ED Provider Notes (Signed)
29 year old female received at signout from [place] pending labs and continued evaluation. She will also require TTS consult. Per her HPI:  "Beth [name] is a 29 y.o. female with a past medical history of alcohol abuse, substance abuse, depression presenting to the ED for suicidal thoughts.  Patient states that she has been having suicidal thoughts for unknown amount of time.  She reports superficial cutting to her right thigh which she has done in the past as well.  States that she relapsed from being clean from alcohol use after about a month.  She states that she was drinking wine today with last drink being approximately 14 h ago.  She has had withdrawal symptoms in the past.  She denies any tremors, hallucinations, homicidal ideation, chest pain, abdominal pain or vomiting."  29 year old gender fluid adult, assigned female at birth, pronouns they/them.  Patient has been on a 3-day alcohol bender.  Has drank multiple bottles of wine over the last 3 days.  Last drink was this morning, approximately 14 hours prior to arrival.  They report they had a fever at work last Wednesday.  No other fevers, but they have been endorsing chills over the last few days.    They have been having multiple episodes of watery diarrhea over the last week.  No recent antibiotic administration.  They are unsure if they have been vomiting due to persistent intoxication over the last few days.  No recent cough or shortness of breath.  No IV drug use.   Per chart review from [facility] note "Collateral information gather from patients mother Mother Beth [name] [phone]) States that patient was recently discharged from a 9 day rehab program in [place] (Got out March 10th).  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  Her work called [place] today and said that she missed work so we went to her apartment and the whole floor is covered with liquor and beer bottles; you  can't even walk in the house.  She hasn't been taking care of herself.  Once she starts drinking she can't stop.  Now she is going to lose her job and then she can't pay her rent.  Her father and I are divorced and she can't stay with either of [place] cause of smaller children; now she is going to be homeless and I don't know what to do."  Discussed [employer] and mother feels it is a good idea.  Informed that patient could not be forced to go and that alcohol use is not criteria for acute psychiatric admission.  Because of the intoxication and alter mental status.  Patient sent to [facility] for medical clarence and will reassess in morning."    Patient reports they have not been taking lithium for several months.  Level 5 caveat secondary to altered mental status over the last few days.  Physical Exam  BP 115/85   Pulse (!) 114   Temp 98.9 F (37.2 C)   Resp 18   Ht 5\' 1"  (1.549 m)   Wt 81.6 kg   SpO2 94%   BMI 34.01 kg/m   Physical Exam Vitals and nursing note reviewed.  Constitutional:      General: She is not in acute distress.    Comments: Anxious appearing.  Feels as if there is skin is crawling.  HENT:     Head: Normocephalic.  Eyes:     Extraocular Movements: Extraocular  movements intact.     Conjunctiva/sclera: Conjunctivae normal.     Pupils: Pupils are equal, round, and reactive to light.  Cardiovascular:     Rate and Rhythm: Regular rhythm. Tachycardia present.     Heart sounds: No murmur. No friction rub. No gallop.   Pulmonary:     Effort: Pulmonary effort is normal. No respiratory distress.     Comments: Wheezes noted on the right mid lung and base.  Lungs are clear otherwise clear to auscultation bilaterally. Abdominal:     General: There is no distension.     Palpations: Abdomen is soft. There is no mass.     Tenderness: There is no abdominal tenderness. There is no right CVA tenderness, left CVA tenderness, guarding or rebound.     Hernia: No hernia is  present.     Comments: Abdomen soft, nontender, nondistended.  Musculoskeletal:     Cervical back: Neck supple.     Right lower leg: No edema.     Left lower leg: No edema.  Skin:    General: Skin is warm.     Capillary Refill: Capillary refill takes 2 to 3 seconds.     Findings: No rash.  Neurological:     General: No focal deficit present.     Mental Status: She is alert.     Comments: Tremulous.  Psychiatric:        Behavior: Behavior normal.     ED Course/Procedures     .Critical Care Performed by: Barkley Boards, PA-C Authorized by: Barkley Boards, PA-C   Critical care provider statement:    Critical care time (minutes):  55   Critical care time was exclusive of:  Separately billable procedures and treating other patients and teaching time   Critical care was necessary to treat or prevent imminent or life-threatening deterioration of the following conditions:  Metabolic crisis   Critical care was time spent personally by me on the following activities:  Ordering and performing treatments and interventions, ordering and review of laboratory studies, ordering and review of radiographic studies, re-evaluation of patient's condition, review of old charts, obtaining history from patient or surrogate, examination of patient, evaluation of patient's response to treatment, development of treatment plan with patient or surrogate and pulse oximetry    MDM   29 year old gender fluid adult AFAB (pronouns they/them) received signout from Beth [name].  Please see her note for further work-up and medical decision making.  Ethanol level was pending and is 221.  Salicylate level is negative.  Tylenol level is not elevated.  Lithium level is decreased.  Labs are notable for leukocytosis of 16.  Will add on differential.  Bicarb is slightly decreased at 19 and anion gap is 19.  Suspect this is secondary to lactic acidosis given recent excessive alcohol use.  However, patient also has been  having infectious symptoms.  With leukocytosis, chills, tachycardia, the patient does meet sepsis criteria.  Rectal temp was obtained.  No fever.  Initial lactate is elevated at 6.5 and sepsis protocols were initiated.  Patient was given a total of 3 L of IV fluids.  Repeat lactate was significantly delayed.  After calling the lab, they report they did not have an order in place.  Epic was checked and 3 different lactate orders were currently pending at that time.  Repeat lactate was 4.7.  This appears to have been collected at 0350, but this resulted within minutes of a second lactate of 1.7, but this appears to  have been drawn at 0605.  The patient does not take Metformin.  pH is upper normal.  Repeat metabolic panel with elevated gap of 17 bicarb remains minimally decreased at 21.   Given that the patient's gap has not closed with significant lactic acidosis, the patient is not medically cleared for psychiatry at this time.  Although it is suspected that lactic acidosis is secondary to significant alcohol use, septic etiology cannot be ruled out at this time.  Blood cultures x2 were initiated prior to antibiotic administration.  On initial evaluation of the patient, the patient appeared to be withdrawing.  Nursing staff documented initial CIWA score of 5.  However, the patient was tachycardic, endorsing skin crawling, very anxious, and I suspect this was much higher since Ativan was given.  Symptoms seem to improve on repeat assessments.  Patient was seen and independently evaluated by Dr. Manus Gunning, attending physician. The patient will require admission for lactic acidosis of undetermined etiology.  The patient is also at very high risk for withdrawal from alcohol.  Consulted the hospitalist team and Dr. Pola Corn will admit. The patient appears reasonably stabilized for admission considering the current resources, flow, and capabilities available in the ED at this time, and I doubt any other [facility] requiring  further screening and/or treatment in the ED prior to admission.      Frederik Pear A, PA-C 12/02/19 0809    Glynn Octave, MD 12/02/19 1651

## 2019-12-01 NOTE — ED Triage Notes (Signed)
Pt cutting self on right thigh. Sts she was dropped off here and does not know why. Having suicidal thoughts with a plan to cut herself.

## 2019-12-01 NOTE — H&P (Addendum)
Behavioral Health Medical Screening Exam  Beth [name] is an 29 y.o. female presents to [facility] as a walk in accompanied by her mother with complaints of alcohol abuse, and danger to herself.   Patient currently not a good historian.  Collateral information gather from patients mother Mother Beth [name] [phone]) States that patient was recently discharged from a 61 day rehab program in [place] (Got out March 10th).  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  Her work called [place] today and said that she missed work so we went to her apartment and the whole floor is covered with liquor and beer bottles; you can't even walk in the house.  She hasn't been taking care of herself.  Once she starts drinking she can't stop.  Now she is going to lose her job and then she can't pay her rent.  Her father and I are divorced and she can't stay with either of [place] cause of smaller children; now she is going to be homeless and I don't know what to do."  Discussed [employer] and mother feels it is a good idea.  Informed that patient could not be forced to go and that alcohol use is not criteria for acute psychiatric admission.  Because of the intoxication and alter mental status.  Patient sent to [facility] for medical clarence and will reassess in morning.    Total Time spent with patient: 30 minutes  Psychiatric Specialty Exam: Physical Exam  Vitals reviewed. Respiratory: Effort normal.  Neurological: She is alert.  Intoxicated  Skin: Skin is warm and dry.  Psychiatric: Her speech is delayed and slurred. Thought content is not paranoid and not delusional. Cognition and memory are impaired. She exhibits a depressed mood. She expresses suicidal ideation. She expresses no homicidal ideation.    Review of Systems  Psychiatric/Behavioral: Positive for decreased concentration. Negative for hallucinations and sleep disturbance. Suicidal ideas:  States she cut her leg.  History of self harm;        Patient has been drinking for at least a day non stop; patient is unable to answer questions related to intoxication  All other systems reviewed and are negative.   Blood pressure (!) 124/91, pulse (!) 134, temperature 97.9 F (36.6 C), temperature source Oral, resp. rate 20, SpO2 95 %.There is no height or weight on file to calculate BMI.  General Appearance: Casual and Disheveled  Eye Contact:  Minimal  Speech:  Slow  Volume:  Decreased  Mood:  Depressed  Affect:  Depressed  Thought Process:  Disorganized and Linear  Orientation:  Full (Time, Place, and Person)  Thought Content:  Denies hallucinations, delusions, and paranoia  Suicidal Thoughts:  Reporting self harm that she will cut herself.    Homicidal Thoughts:  No  Memory:  Immediate;   Poor Recent;   Poor  Judgement:  Impaired  Insight:  Lacking  Psychomotor Activity:  Decreased  Concentration: Concentration: Poor and Attention Span: Poor  Recall:  Poor  Fund of Knowledge:Fair  Language: Fair  Akathisia:  No  Handed:  Right  AIMS (if indicated):     Assets:  Housing Social Support  Sleep:       Musculoskeletal: Strength & Muscle Tone: within normal limits Gait & Station: normal Patient leans: N/A  Blood pressure (!) 124/91, pulse (!) 134, temperature 97.9 F (36.6 C), temperature source Oral, resp. rate 20, SpO2 95 %.  Recommendations:  Sending to [facility] for medical clarence.  Detox/rehab services recommended.  Fax out to [facility], [facility] and other facilities   Based on my evaluation the patient appears to have an emergency medical condition for which I recommend the patient be transferred to the emergency department for further evaluation.   Spoke with Dr. Estell Harpin informed sending patient to [facility] for medical clearance.    Heike Pounds, NP 12/01/2019, 6:45 PM

## 2019-12-01 NOTE — ED Notes (Signed)
Pt changed into wine scrubs, wanded by security, and her belongings placed in one bag under the ice machine in triage.
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
## 2019-12-01 NOTE — ED Notes (Signed)
Pt's belongings were brought to the 9-25 area.

## 2019-12-02 ENCOUNTER — Emergency Department (HOSPITAL_COMMUNITY): Payer: BC Managed Care – PPO

## 2019-12-02 ENCOUNTER — Encounter (HOSPITAL_COMMUNITY): Payer: Self-pay | Admitting: Internal Medicine

## 2019-12-02 DIAGNOSIS — F1020 Alcohol dependence, uncomplicated: Secondary | ICD-10-CM | POA: Diagnosis not present

## 2019-12-02 DIAGNOSIS — R45851 Suicidal ideations: Secondary | ICD-10-CM | POA: Diagnosis not present

## 2019-12-02 DIAGNOSIS — F10239 Alcohol dependence with withdrawal, unspecified: Secondary | ICD-10-CM | POA: Diagnosis present

## 2019-12-02 DIAGNOSIS — F10231 Alcohol dependence with withdrawal delirium: Secondary | ICD-10-CM | POA: Diagnosis not present

## 2019-12-02 DIAGNOSIS — R0602 Shortness of breath: Secondary | ICD-10-CM | POA: Diagnosis not present

## 2019-12-02 DIAGNOSIS — F10939 Alcohol use, unspecified with withdrawal, unspecified: Secondary | ICD-10-CM | POA: Diagnosis present

## 2019-12-02 LAB — BASIC METABOLIC PANEL
Anion gap: 17 — ABNORMAL HIGH (ref 5–15)
BUN: 6 mg/dL (ref 6–20)
CO2: 21 mmol/L — ABNORMAL LOW (ref 22–32)
Calcium: 8.9 mg/dL (ref 8.9–10.3)
Chloride: 101 mmol/L (ref 98–111)
Creatinine, Ser: 0.6 mg/dL (ref 0.44–1.00)
GFR calc Af Amer: >60 mL/min (ref >60)
GFR calc non Af Amer: >60 mL/min (ref >60)
Glucose, Bld: 96 mg/dL (ref 70–99)
Potassium: 3.3 mmol/L — ABNORMAL LOW (ref 3.5–5.1)
Sodium: 139 mmol/L (ref 135–145)

## 2019-12-02 LAB — SALICYLATE LEVEL: Salicylate Lvl: <7 mg/dL — ABNORMAL LOW (ref 7.0–30.0)

## 2019-12-02 LAB — LACTIC ACID, PLASMA
Lactic Acid, Venous: 6.5 mmol/L (ref 0.5–1.9)
Lactic Acid, Venous: 1.7 mmol/L (ref 0.5–1.9)
Lactic Acid, Venous: 4.7 mmol/L (ref 0.5–1.9)
Lactic Acid, Venous: 2.1 mmol/L (ref 0.5–1.9)

## 2019-12-02 LAB — RESPIRATORY PANEL BY RT PCR (FLU A&B, COVID)
Influenza A by PCR: NEGATIVE
Influenza B by PCR: NEGATIVE
SARS Coronavirus 2 by RT PCR: NEGATIVE

## 2019-12-02 LAB — LITHIUM LEVEL: Lithium Lvl: <0.06 mmol/L — ABNORMAL LOW (ref 0.60–1.20)

## 2019-12-02 LAB — URINALYSIS, ROUTINE W REFLEX MICROSCOPIC
Bilirubin Urine: NEGATIVE
Glucose, UA: NEGATIVE mg/dL
Ketones, ur: NEGATIVE mg/dL
Leukocytes,Ua: NEGATIVE
Nitrite: NEGATIVE
Protein, ur: NEGATIVE mg/dL
Specific Gravity, Urine: 1.005 (ref 1.005–1.030)
pH: 7 (ref 5.0–8.0)

## 2019-12-02 LAB — BLOOD GAS, VENOUS
Acid-base deficit: 1.5 mmol/L (ref 0.0–2.0)
Bicarbonate: 21 mmol/L (ref 20.0–28.0)
O2 Saturation: 88.9 %
Patient temperature: 98.6
pCO2, Ven: 30.3 mmHg — ABNORMAL LOW (ref 44.0–60.0)
pH, Ven: 7.456 — ABNORMAL HIGH (ref 7.250–7.430)
pO2, Ven: 58.5 mmHg — ABNORMAL HIGH (ref 32.0–45.0)

## 2019-12-02 LAB — ACETAMINOPHEN LEVEL: Acetaminophen (Tylenol), Serum: <10 ug/mL — ABNORMAL LOW (ref 10–30)

## 2019-12-02 LAB — PHOSPHORUS: Phosphorus: 2.7 mg/dL (ref 2.5–4.6)

## 2019-12-02 LAB — MAGNESIUM: Magnesium: 1.6 mg/dL — ABNORMAL LOW (ref 1.7–2.4)

## 2019-12-02 MED ORDER — ACETAMINOPHEN 325 MG PO TABS
650.0000 mg | ORAL_TABLET | Freq: Four times a day (QID) | ORAL | Status: DC | PRN
  Administered 2019-12-03 (×2): 650 mg via ORAL
  Filled 2019-12-02 (×3): qty 2

## 2019-12-02 MED ORDER — CHLORDIAZEPOXIDE HCL 25 MG PO CAPS
25.0000 mg | ORAL_CAPSULE | Freq: Three times a day (TID) | ORAL | Status: DC
  Administered 2019-12-02: 25 mg via ORAL
  Filled 2019-12-02: qty 1

## 2019-12-02 MED ORDER — MAGNESIUM HYDROXIDE 400 MG/5ML PO SUSP: 30.0000 mL | Freq: Every day | ORAL | Status: DC | PRN

## 2019-12-02 MED ORDER — THIAMINE HCL 100 MG/ML IJ SOLN
500.0000 mg | Freq: Every day | INTRAVENOUS | Status: DC
  Administered 2019-12-02 – 2019-12-04 (×3): 500 mg via INTRAVENOUS
  Filled 2019-12-02 (×3): qty 5

## 2019-12-02 MED ORDER — THIAMINE HCL 100 MG PO TABS: 100.0000 mg | ORAL_TABLET | Freq: Every day | ORAL | Status: DC

## 2019-12-02 MED ORDER — ONDANSETRON HCL 4 MG/2ML IJ SOLN
4.0000 mg | Freq: Four times a day (QID) | INTRAMUSCULAR | Status: DC | PRN
  Administered 2019-12-03: 4 mg via INTRAVENOUS
  Filled 2019-12-02: qty 2

## 2019-12-02 MED ORDER — SODIUM CHLORIDE 0.9 % IV SOLN
1.0000 g | Freq: Once | INTRAVENOUS | Status: AC
  Administered 2019-12-02: 1 g via INTRAVENOUS
  Filled 2019-12-02: qty 10

## 2019-12-02 MED ORDER — THIAMINE HCL 100 MG/ML IJ SOLN: 100.0000 mg | Freq: Every day | INTRAMUSCULAR | Status: DC

## 2019-12-02 MED ORDER — SODIUM CHLORIDE 0.9% FLUSH: 3.0000 mL | Freq: Two times a day (BID) | INTRAVENOUS | Status: DC

## 2019-12-02 MED ORDER — ONDANSETRON HCL 4 MG PO TABS: 4.0000 mg | ORAL_TABLET | Freq: Four times a day (QID) | ORAL | Status: DC | PRN

## 2019-12-02 MED ORDER — ACETAMINOPHEN 650 MG RE SUPP: 650.0000 mg | Freq: Four times a day (QID) | RECTAL | Status: DC | PRN

## 2019-12-02 MED ORDER — SODIUM CHLORIDE 0.9 % IV BOLUS
1000.0000 mL | Freq: Once | INTRAVENOUS | Status: AC
  Administered 2019-12-02: 1000 mL via INTRAVENOUS

## 2019-12-02 MED ORDER — SODIUM CHLORIDE 0.9 % IV SOLN: INTRAVENOUS | Status: DC

## 2019-12-02 MED ORDER — FOLIC ACID 1 MG PO TABS
1.0000 mg | ORAL_TABLET | Freq: Every day | ORAL | Status: DC
  Administered 2019-12-02 – 2019-12-04 (×3): 1 mg via ORAL
  Filled 2019-12-02 (×3): qty 1

## 2019-12-02 MED ORDER — SORBITOL 70 % SOLN
30.0000 mL | Freq: Every day | Status: DC | PRN
  Filled 2019-12-02: qty 30

## 2019-12-02 MED ORDER — SENNA 8.6 MG PO TABS
1.0000 | ORAL_TABLET | Freq: Two times a day (BID) | ORAL | Status: DC
  Administered 2019-12-02 – 2019-12-03 (×3): 8.6 mg via ORAL
  Filled 2019-12-02 (×4): qty 1

## 2019-12-02 NOTE — ED Notes (Signed)
Date and time results received: 12/02/19 0344 (use smartphrase ".now" to insert current time)  Test: Lactic Acid Critical Value: 6.5  Name of Provider Notified: Mia Pa  Orders Received? Or Actions Taken?: Sepsis Protocol

## 2019-12-02 NOTE — ED Notes (Signed)
Mia PA was advised of pt having elevated Lactic Acid level immediately PA and writer were at bedside to initiate Sepsis protocol. IV access, blood cultures, and labs were obtained along with rectal temp.
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
## 2019-12-02 NOTE — ED Notes (Signed)
Patient made aware of need for additional urine sample.
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
## 2019-12-02 NOTE — Progress Notes (Signed)
   12/02/19 1857  Assess: MEWS Score  Temp 98.8 F (37.1 C)  BP (!) 126/92  Pulse Rate (!) 116  Resp 18  SpO2 96 %  O2 Device Room Air  Assess: MEWS Score  MEWS Temp 0  MEWS Systolic 0  MEWS Pulse 2  MEWS RR 0  MEWS LOC 0  MEWS Score 2  MEWS Score Color Yellow  Assess: if the MEWS score is Yellow or Red  Were vital signs taken at a resting state? Yes  Focused Assessment Documented focused assessment  Early Detection of Sepsis Score *See Row Information* Low  MEWS guidelines implemented *See Row Information* Yes  Treat  MEWS Interventions Other (Comment) (treated for sepsis)  Take Vital Signs  Increase Vital Sign Frequency  Yellow: Q 2hr X 2 then Q 4hr X 2, if remains yellow, continue Q 4hrs  Escalate  MEWS: Escalate Yellow: discuss with charge nurse/RN and consider discussing with provider and RRT  Notify: Charge Nurse/RN  Name of Charge Nurse/RN Notified Chasity  Date Charge Nurse/RN Notified 12/02/19  Time Charge Nurse/RN Notified 1900  Document  Patient Outcome Other (Comment) (stable)

## 2019-12-02 NOTE — Progress Notes (Signed)
Patient arrives to room 1519 from the ED at this time via wheelchair. Patient indedpendant from stretcher to bed. Patient oriented to callbell.  Bed low with siderails up x2. Sitter at bedside

## 2019-12-03 DIAGNOSIS — F17210 Nicotine dependence, cigarettes, uncomplicated: Secondary | ICD-10-CM | POA: Diagnosis present

## 2019-12-03 DIAGNOSIS — F1020 Alcohol dependence, uncomplicated: Secondary | ICD-10-CM | POA: Diagnosis not present

## 2019-12-03 DIAGNOSIS — Z915 Personal history of self-harm: Secondary | ICD-10-CM | POA: Diagnosis not present

## 2019-12-03 DIAGNOSIS — F332 Major depressive disorder, recurrent severe without psychotic features: Secondary | ICD-10-CM | POA: Diagnosis present

## 2019-12-03 DIAGNOSIS — Z79899 Other long term (current) drug therapy: Secondary | ICD-10-CM | POA: Diagnosis not present

## 2019-12-03 DIAGNOSIS — R45851 Suicidal ideations: Secondary | ICD-10-CM | POA: Diagnosis present

## 2019-12-03 DIAGNOSIS — G92 Toxic encephalopathy: Secondary | ICD-10-CM | POA: Diagnosis present

## 2019-12-03 DIAGNOSIS — F4310 Post-traumatic stress disorder, unspecified: Secondary | ICD-10-CM | POA: Diagnosis present

## 2019-12-03 DIAGNOSIS — Z20822 Contact with and (suspected) exposure to covid-19: Secondary | ICD-10-CM | POA: Diagnosis present

## 2019-12-03 DIAGNOSIS — Z9114 Patient's other noncompliance with medication regimen: Secondary | ICD-10-CM | POA: Diagnosis not present

## 2019-12-03 DIAGNOSIS — E860 Dehydration: Secondary | ICD-10-CM | POA: Diagnosis present

## 2019-12-03 DIAGNOSIS — Z793 Long term (current) use of hormonal contraceptives: Secondary | ICD-10-CM | POA: Diagnosis not present

## 2019-12-03 DIAGNOSIS — F10229 Alcohol dependence with intoxication, unspecified: Secondary | ICD-10-CM | POA: Diagnosis present

## 2019-12-03 DIAGNOSIS — F10239 Alcohol dependence with withdrawal, unspecified: Secondary | ICD-10-CM | POA: Diagnosis present

## 2019-12-03 DIAGNOSIS — Z91410 Personal history of adult physical and sexual abuse: Secondary | ICD-10-CM | POA: Diagnosis not present

## 2019-12-03 DIAGNOSIS — E872 Acidosis: Secondary | ICD-10-CM | POA: Diagnosis present

## 2019-12-03 LAB — CBC
HCT: 37 % (ref 36.0–46.0)
Hemoglobin: 12.1 g/dL (ref 12.0–15.0)
MCH: 28.9 pg (ref 26.0–34.0)
MCHC: 32.7 g/dL (ref 30.0–36.0)
MCV: 88.3 fL (ref 80.0–100.0)
Platelets: 264 10*3/uL (ref 150–400)
RBC: 4.19 MIL/uL (ref 3.87–5.11)
RDW: 14.4 % (ref 11.5–15.5)
WBC: 8.2 10*3/uL (ref 4.0–10.5)
nRBC: 0 % (ref 0.0–0.2)

## 2019-12-03 LAB — BASIC METABOLIC PANEL
Anion gap: 11 (ref 5–15)
BUN: 5 mg/dL — ABNORMAL LOW (ref 6–20)
CO2: 22 mmol/L (ref 22–32)
Calcium: 9.1 mg/dL (ref 8.9–10.3)
Chloride: 107 mmol/L (ref 98–111)
Creatinine, Ser: 0.5 mg/dL (ref 0.44–1.00)
GFR calc Af Amer: >60 mL/min (ref >60)
GFR calc non Af Amer: >60 mL/min (ref >60)
Glucose, Bld: 107 mg/dL — ABNORMAL HIGH (ref 70–99)
Potassium: 3.4 mmol/L — ABNORMAL LOW (ref 3.5–5.1)
Sodium: 140 mmol/L (ref 135–145)

## 2019-12-03 LAB — HIV ANTIBODY (ROUTINE TESTING W REFLEX): HIV Screen 4th Generation wRfx: NONREACTIVE

## 2019-12-03 MED ORDER — LORAZEPAM 1 MG PO TABS
1.0000 mg | ORAL_TABLET | ORAL | Status: DC | PRN
  Administered 2019-12-04: 2 mg via ORAL
  Filled 2019-12-03: qty 2

## 2019-12-03 MED ORDER — LORAZEPAM 2 MG/ML IJ SOLN
1.0000 mg | INTRAMUSCULAR | Status: DC | PRN
  Administered 2019-12-03: 2 mg via INTRAVENOUS
  Filled 2019-12-03: qty 1

## 2019-12-03 MED ORDER — POTASSIUM CHLORIDE CRYS ER 20 MEQ PO TBCR
40.0000 meq | EXTENDED_RELEASE_TABLET | Freq: Once | ORAL | Status: AC
  Administered 2019-12-03: 40 meq via ORAL
  Filled 2019-12-03: qty 2

## 2019-12-03 NOTE — TOC Initial Note (Signed)
Transition of Care [facility]) - Initial/Assessment Note    Patient Details  Name: Beth [name] MRN: [id number] Date of Birth: Jan 22, 1991  Transition of Care [facility]) CM/SW Contact:    Trish Mage, LCSW Phone Number: 12/03/2019, 11:47 AM  Clinical Narrative:  Met with patient in response to physician consult re: substance abuse.  Ms Uram states she had 2.5 years of sobriety, and attributes it to [place] and her sponsor.  Relapsed sometime during COVID-attributes this to the move to virtual meetings and "feeling sad."  Martin Majestic to a 28 day program in [place],  has been out for about a month, relapsed, and now finds herself in a bind as her parents are helping her financially, and they are telling her she needs to go to her aunt's home in [place].  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  She states her mother tells her she has lost her job, but she herself has not called to speak to her supervisor.  I encouraged her to call, and printed a list of local [facility] for her per request.  No further needs identified. TOC will continue to follow during the course of hospitalization.              Expected Discharge Plan: Home/Self Care Barriers to Discharge: No Barriers Identified   Patient Goals and CMS Choice Patient states their goals for this hospitalization and ongoing recovery are:: I don't want to go to [facility]      Expected Discharge Plan and Services Expected Discharge Plan: Home/Self Care In-house Referral: Clinical Social Work     Living arrangements for the past 2 months: Apartment                                      Prior Living Arrangements/Services Living arrangements for the past 2 months: Apartment Lives with:: Self Patient language and need for interpreter reviewed:: Yes Do you feel  safe going back to the place where you live?: Yes      Need for Family Participation in Patient Care: No (Comment) Care giver support system in place?: Yes (comment)   Criminal Activity/Legal Involvement Pertinent to Current Situation/Hospitalization: No - Comment as needed  Activities of Daily Living Home Assistive Devices/Equipment: None ADL Screening (condition at time of admission) Patient's cognitive ability adequate to safely complete daily activities?: No(patient not sure why she was dropped off at the hospital) Is the patient deaf or have difficulty hearing?: No Does the patient have difficulty seeing, even when wearing glasses/contacts?: No Does the patient have difficulty concentrating, remembering, or making decisions?: Yes Patient able to express need for assistance with ADLs?: Yes Does the patient have difficulty dressing or bathing?: No Independently performs ADLs?: Yes (appropriate for developmental age) Does the patient have difficulty walking or climbing stairs?: No Weakness of Legs: None Weakness of Arms/Hands: None  Permission Sought/Granted                  Emotional Assessment Appearance:: Appears older than stated age Attitude/Demeanor/Rapport: Engaged Affect (typically observed): Appropriate Orientation: : Oriented to Self, Oriented to Place, Oriented to  Time, Oriented to Situation Alcohol / Substance Use: Alcohol Use Psych Involvement:  No (comment)  Admission diagnosis:  Alcohol withdrawal ([place]) [F10.239] Suicidal ideation [R45.851] Lactic acidosis [E87.2] Alcohol withdrawal syndrome with complication [facility]) [D80.063] Patient Active Problem List   Diagnosis Date Noted  . Alcohol withdrawal ([place]) 12/02/2019  . Alcohol use disorder, severe, dependence ([place]) 12/02/2019  . Suicidal ideation   . Anxiety 11/10/2019  . Influenza-like symptoms 07/22/2019  . Viral respiratory illness 07/05/2019  . Mild concussion 04/20/2019  . Viral illness 04/20/2019   . MDD (major depressive disorder), recurrent episode, severe ([place]) 12/19/2018  . Dyspepsia 09/02/2018  . Pilonidal cyst without infection 03/04/2018  . Tobacco use disorder 01/11/2018  . Abnormal Pap smear of cervix 10/01/2017  . Post traumatic stress disorder (PTSD) 06/14/2016  . MDD (major depressive disorder), recurrent, severe, with psychosis ([place]) 06/05/2016   PCP:  Nuala Alpha, DO Pharmacy:   [facility] [phone] [address], [place] [facility] [address] Phone: [phone] Fax: [phone]     Social Determinants of Health (SDOH) Interventions    Readmission Risk Interventions No flowsheet data found.

## 2019-12-03 NOTE — Progress Notes (Signed)
PROGRESS NOTE    Beth [name]  YJE:[id number] DOB: [birth year] DOA: 12/01/2019 PCP: Arlyce Harman, DO   Brief Narrative: 29 year old with past medical history significant for alcohol abuse, substance abuse, depression, anxiety, suicidal attempt the past requiring admission to behavioral health.  Patient was found by her mom intoxicated who brought her to behavioral health unit subsequently transferred to the ED for clearance. Patient was admitted for alcohol intoxication and subsequently alcohol withdrawal.  Patient had sepsis-like picture, SIRS criteria likely related to alcohol intoxication and dehydration.  Work-up for infection negative. Evaluated by psych and has been clear, patient does not require admission to psychiatric facility.  Assessment & Plan:   Principal Problem:   Alcohol use disorder, severe, dependence (HCC) Active Problems:   Alcohol withdrawal (HCC)  1-chronic alcoholism and recently relapsed Alcohol intoxication, high risk for alcohol withdrawal acute toxic metabolic encephalopathy Presented intoxicated with lactic acidosis. She is alert now and conversant.  Lactic acid has decreased. Continue with supportive care. Social worker has provided patient with outpatient rehab facility.  2-alcohol withdrawal: CIWA score at 14.  Continue with Ativan as needed 3-sepsis rule out. Suicidal ideation history of suicidal attempt, self mutilating behavior: She has been cleared by psych  Estimated body mass index is 34.01 kg/m as calculated from the following:   Height as of this encounter: 5\' 1"  (1.549 m).   Weight as of this encounter: 81.6 kg.   DVT prophylaxis: Lovenox Code Status: Full code Family Communication: Care discussed with patient Disposition Plan:  Patient is from: Home Anticipated d/c date: 24 hours after resolution of alcohol withdrawal, discharged home Barriers to d/c or necessity for inpatient status: Remain inpatient with, requiring  Ativan  Consultants:   Psychiatrist  Procedures:   None  Antimicrobials:  None  Subjective: Patient feeling sad, anxious.  She does not know if she is going to go back home or if she is going to with her Aunt.   Objective: Vitals:   12/03/19 0300 12/03/19 0640 12/03/19 1204 12/03/19 1300  BP: (!) 123/94 125/88 (!) 122/94 (!) 122/94  Pulse: 100 90 79 79  Resp: 18 18 17    Temp: 98.4 F (36.9 C) 98.4 F (36.9 C) 98.7 F (37.1 C)   TempSrc: Oral Oral Oral   SpO2: 96% 97% 98%   Weight:      Height:        Intake/Output Summary (Last 24 hours) at 12/03/2019 1758 Last data filed at 12/03/2019 0930 Gross per 24 hour  Intake 873.75 ml  Output --  Net 873.75 ml   Filed Weights   12/01/19 2029  Weight: 81.6 kg    Examination:  General exam: Appears calm and comfortable  Respiratory system: Clear to auscultation. Respiratory effort normal. Cardiovascular system: S1 & S2 heard, RRR. No JVD, murmurs, rubs, gallops or clicks. No pedal edema. Gastrointestinal system: Abdomen is nondistended, soft and nontender. No organomegaly or masses felt. Normal bowel sounds heard. Central nervous system: Alert and oriented. No focal neurological deficits. Extremities: Symmetric 5 x 5 power. Skin: No rashes, lesions or ulcers   Data Reviewed: I have personally reviewed following labs and imaging studies  CBC: Recent Labs  Lab 12/01/19 2150 12/03/19 0609  WBC 16.0* 8.2  HGB 14.4 12.1  HCT 42.9 37.0  MCV 85.8 88.3  PLT 484* 264   Basic Metabolic Panel: Recent Labs  Lab 12/01/19 2150 12/02/19 0400 12/03/19 0609  NA 139 139 140  K 3.9 3.3* 3.4*  CL 101 101  107  CO2 19* 21* 22  GLUCOSE 113* 96 107*  BUN 7 6 5*  CREATININE 0.63 0.60 0.50  CALCIUM 8.7* 8.9 9.1  MG  --  1.6*  --   PHOS  --  2.7  --    GFR: Estimated Creatinine Clearance: 99.5 mL/min (by C-G formula based on SCr of 0.5 mg/dL). Liver Function Tests: Recent Labs  Lab 12/01/19 2150  AST 45*   ALT 49*  ALKPHOS 105  BILITOT 0.8  PROT 8.2*  ALBUMIN 4.7   No results for input(s): LIPASE, AMYLASE in the last 168 hours. No results for input(s): AMMONIA in the last 168 hours. Coagulation Profile: No results for input(s): INR, PROTIME in the last 168 hours. Cardiac Enzymes: No results for input(s): CKTOTAL, CKMB, CKMBINDEX, TROPONINI in the last 168 hours. BNP (last 3 results) No results for input(s): PROBNP in the last 8760 hours. HbA1C: No results for input(s): HGBA1C in the last 72 hours. CBG: No results for input(s): GLUCAP in the last 168 hours. Lipid Profile: No results for input(s): CHOL, HDL, LDLCALC, TRIG, CHOLHDL, LDLDIRECT in the last 72 hours. Thyroid Function Tests: No results for input(s): TSH, T4TOTAL, FREET4, T3FREE, THYROIDAB in the last 72 hours. Anemia Panel: No results for input(s): VITAMINB12, FOLATE, FERRITIN, TIBC, IRON, RETICCTPCT in the last 72 hours. Sepsis Labs: Recent Labs  Lab 12/02/19 0015 12/02/19 0350 12/02/19 0605 12/02/19 0814  LATICACIDVEN 6.5* 4.7* 1.7 2.1*    Recent Results (from the past 240 hour(s))  Respiratory Panel by RT PCR (Flu A&B, Covid) - Nasopharyngeal Swab     Status: None   Collection Time: 12/01/19 11:56 PM   Specimen: Nasopharyngeal Swab  Result Value Ref Range Status   SARS Coronavirus 2 by RT PCR NEGATIVE NEGATIVE Final    Comment: (NOTE) SARS-CoV-2 target nucleic acids are NOT DETECTED. The SARS-CoV-2 RNA is generally detectable in upper respiratoy specimens during the acute phase of infection. The lowest concentration of SARS-CoV-2 viral copies this assay can detect is 131 copies/mL. A negative result does not preclude SARS-Cov-2 infection and should not be used as the sole basis for treatment or other patient management decisions. A negative result may occur with  improper specimen collection/handling, submission of specimen other than nasopharyngeal swab, presence of viral mutation(s) within the areas  targeted by this assay, and inadequate number of viral copies (<131 copies/mL). A negative result must be combined with clinical observations, patient history, and epidemiological information. The expected result is Negative. Fact Sheet for Patients:  https://www.moore.com/ Fact Sheet for Healthcare Providers:  https://www.young.biz/ This test is not yet ap proved or cleared by the [place] FDA and  has been authorized for detection and/or diagnosis of SARS-CoV-2 by FDA under an Emergency Use Authorization (EUA). This EUA will remain  in effect (meaning this test can be used) for the duration of the COVID-19 declaration under Section 564(b)(1) of the Act, 21 U.S.C. section 360bbb-3(b)(1), unless the authorization is terminated or revoked sooner.    Influenza A by PCR NEGATIVE NEGATIVE Final   Influenza B by PCR NEGATIVE NEGATIVE Final    Comment: (NOTE) The Xpert Xpress SARS-CoV-2/FLU/RSV assay is intended as an aid in  the diagnosis of influenza from Nasopharyngeal swab specimens and  should not be used as a sole basis for treatment. Nasal washings and  aspirates are unacceptable for Xpert Xpress SARS-CoV-2/FLU/RSV  testing. Fact Sheet for Patients: https://www.moore.com/ Fact Sheet for Healthcare Providers: https://www.young.biz/ This test is not yet approved or cleared by the  [place] Architectural technologist and  has been authorized for detection and/or diagnosis of SARS-CoV-2 by  FDA under an Print production planner (EUA). This EUA will remain  in effect (meaning this test can be used) for the duration of the  Covid-19 declaration under Section 564(b)(1) of the Act, 21  U.S.C. section 360bbb-3(b)(1), unless the authorization is  terminated or revoked. Performed at [facility], [place] [address], [place], [place] [address]   Blood culture (routine x 2)     Status: None (Preliminary result)    Collection Time: 12/02/19  3:45 AM   Specimen: BLOOD  Result Value Ref Range Status   Specimen Description   Final    BLOOD BLOOD LEFT FOREARM Performed at [place] [address], [place], [place] [address]    Special Requests   Final    BOTTLES DRAWN AEROBIC AND ANAEROBIC Blood Culture adequate volume Performed at [place] [address] [place], [place] [address]    Culture   Final    NO GROWTH 1 DAY Performed at [facility], [place] [address] [place], [place] [address]    Report Status PENDING  Incomplete  Blood culture (routine x 2)     Status: None (Preliminary result)   Collection Time: 12/02/19  3:50 AM   Specimen: BLOOD  Result Value Ref Range Status   Specimen Description   Final    BLOOD BLOOD RIGHT FOREARM Performed at [place] [address], [place], [place] [address]    Special Requests   Final    BOTTLES DRAWN AEROBIC AND ANAEROBIC Blood Culture results may not be optimal due to an excessive volume of blood received in culture bottles Performed at [place] [address] [place], [place] [address]    Culture   Final    NO GROWTH 1 DAY Performed at [facility], [place] [address] [place],  [address]    Report Status PENDING  Incomplete         Radiology Studies: DG Chest 2 View  Result Date: 12/02/2019 CLINICAL DATA:  Shortness of breath. EXAM: CHEST - 2 VIEW COMPARISON:  04/13/2019 FINDINGS: The cardiac silhouette, mediastinal and hilar contours are within normal given the AP projection. Very low lung volumes with vascular crowding and streaky atelectasis. No obvious infiltrates or effusions. The bony thorax is intact. IMPRESSION: Very low lung volumes with vascular crowding and streaky atelectasis. Electronically Signed   By: Marijo Sanes M.D.   On: 12/02/2019 05:07        Scheduled Meds: . folic acid  1 mg Oral Daily  . LORazepam   0-4 mg Intravenous Q6H   Or  . LORazepam  0-4 mg Oral Q6H  . [START ON 12/04/2019] LORazepam  0-4 mg Intravenous Q12H   Or  . [START ON 12/04/2019] LORazepam  0-4 mg Oral Q12H  . senna  1 tablet Oral BID  . sodium chloride flush  3 mL Intravenous Q12H  . [START ON 12/07/2019] thiamine  100 mg Oral Daily   Or  . [START ON 12/07/2019] thiamine  100 mg Intravenous Daily   Continuous Infusions: . sodium chloride 100 mL/hr at 12/02/19 1907  . thiamine injection 500 mg (12/03/19 1006)     LOS: 0 days    Time spent: 35 minutes.     Elmarie Shiley, MD [facility]   If 7PM-7AM, please contact night-coverage [email or web address]  12/03/2019, 5:58 PM

## 2019-12-04 LAB — BASIC METABOLIC PANEL
Anion gap: 6 (ref 5–15)
BUN: 5 mg/dL — ABNORMAL LOW (ref 6–20)
CO2: 22 mmol/L (ref 22–32)
Calcium: 8.5 mg/dL — ABNORMAL LOW (ref 8.9–10.3)
Chloride: 110 mmol/L (ref 98–111)
Creatinine, Ser: 0.49 mg/dL (ref 0.44–1.00)
GFR calc Af Amer: >60 mL/min (ref >60)
GFR calc non Af Amer: >60 mL/min (ref >60)
Glucose, Bld: 97 mg/dL (ref 70–99)
Potassium: 3.7 mmol/L (ref 3.5–5.1)
Sodium: 138 mmol/L (ref 135–145)

## 2019-12-04 LAB — CBC
HCT: 35.8 % — ABNORMAL LOW (ref 36.0–46.0)
Hemoglobin: 11.7 g/dL — ABNORMAL LOW (ref 12.0–15.0)
MCH: 28.5 pg (ref 26.0–34.0)
MCHC: 32.7 g/dL (ref 30.0–36.0)
MCV: 87.1 fL (ref 80.0–100.0)
Platelets: 255 10*3/uL (ref 150–400)
RBC: 4.11 MIL/uL (ref 3.87–5.11)
RDW: 14.3 % (ref 11.5–15.5)
WBC: 7.5 10*3/uL (ref 4.0–10.5)
nRBC: 0 % (ref 0.0–0.2)

## 2019-12-04 MED ORDER — FOLIC ACID 1 MG PO TABS: 1.0000 mg | ORAL_TABLET | Freq: Every day | ORAL | 0 refills | Status: DC

## 2019-12-04 MED ORDER — ARIPIPRAZOLE (SENSOR) 5 MG PO TABS: 5.0000 mg | ORAL_TABLET | Freq: Every day | ORAL | 1 refills | Status: DC
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
## 2019-12-04 NOTE — Progress Notes (Signed)
Discharge instructions given along with perscriptions.  Patient states she will not be able to get the meds the doctor has perscribed because her father will not allow her to take medication.  Patient has very flat affect and states she does not want to go to her aunt's in [place].  This RN spoke to father regarding importance of the medications.  He refused to make eye contact during discussion and only mumbled "ok" in response.

## 2019-12-04 NOTE — Discharge Summary (Signed)
Physician Discharge Summary  KEYOSHA TIEDT OAC:[id number] DOB: Jan 01, 1991 DOA: 12/01/2019  PCP: Arlyce Harman, DO  Admit date: 12/01/2019 Discharge date: 12/04/2019  Admitted From: Home Disposition: Home  Recommendations for Outpatient Follow-up:  1. Follow up with PCP in 1-2 weeks 2. Please obtain BMP/CBC in one week 3. Follow up with Psych for further management of her medications.  4. Follow up with outpatient alcohol Rehab.   Discharge Condition: Stable.  CODE STATUS; Full code Diet recommendation: Heart Healthy   Brief/Interim Summary: 29 year old with past medical history significant for alcohol abuse, substance abuse, depression, anxiety, suicidal attempt the past requiring admission to behavioral health.  Patient was found by her mom intoxicated who brought her to behavioral health unit subsequently transferred to the ED for clearance. Patient was admitted for alcohol intoxication and subsequently alcohol withdrawal.  Patient had sepsis-like picture, SIRS criteria likely related to alcohol intoxication and dehydration.  Work-up for infection negative. Evaluated by psych and has been clear, patient does not require admission to psychiatric facility.  1-Chronic alcoholism and recently relapsed Alcohol intoxication, high risk for alcohol withdrawal acute toxic metabolic encephalopathy Presented intoxicated with lactic acidosis. She is alert now and conversant.  Lactic acid has decreased. Treated with supportive care. Social worker has provided patient with outpatient rehab facility. Patient will go to [place] to stay with family.   2-Alcohol withdrawal: CIWA score at 4. Stable.   3-sepsis rule out. Suicidal ideation history of suicidal attempt, self mutilating behavior: She has been cleared by psych  4-Depression; Psych consulted for medications management. Discharge on Abilify, proozac, trazodone, Buspar.  Counsel provided to patient in regards alcohol use and her  medications interation for serious adverse effect.    Discharge Diagnoses:  Principal Problem:   Alcohol use disorder, severe, dependence (HCC) Active Problems:   Alcohol withdrawal [facility])    Discharge Instructions  Discharge Instructions    Diet - low sodium heart healthy   Complete by: As directed    Increase activity slowly   Complete by: As directed      Allergies as of 12/04/2019      Reactions   Benadryl Allergy [diphenhydramine Hcl] Other (See Comments)   Reports makes her feel nervous and jerky   Latex Other (See Comments)   irritation      Medication List    STOP taking these medications   lithium carbonate 300 MG CR tablet Commonly known as: LITHOBID   naltrexone 50 MG tablet Commonly known as: DEPADE     TAKE these medications   ARIPiprazole (sensor) 5 MG Tabs Take 5 mg by mouth at bedtime.   busPIRone 10 MG tablet Commonly known as: BUSPAR Take 1 tablet (10 mg total) by mouth 3 (three) times daily.   docusate sodium 100 MG capsule Commonly known as: Colace Take 1 capsule (100 mg total) by mouth 2 (two) times daily as needed for mild constipation.   FLUoxetine 40 MG capsule Commonly known as: PROZAC Take 1 capsule (40 mg total) by mouth daily.   fluticasone 50 MCG/ACT nasal spray Commonly known as: FLONASE Place 1-2 sprays into both nostrils daily as needed for allergies.   folic acid 1 MG tablet Commonly known as: FOLVITE Take 1 tablet (1 mg total) by mouth daily. Start taking on: December 05, 2019   hydrOXYzine 50 MG tablet Commonly known as: ATARAX/VISTARIL Take 1 tablet (50 mg total) by mouth daily as needed for anxiety.   levonorgestrel 20 MCG/24HR IUD Commonly known as: MIRENA 1 each  by Intrauterine route once.   propranolol 10 MG tablet Commonly known as: INDERAL Take 1 tablet (10 mg total) by mouth 2 (two) times daily.   traZODone 50 MG tablet Commonly known as: DESYREL Take 1 tablet (50 mg total) by mouth at bedtime as needed  for sleep. What changed: how much to take       Allergies  Allergen Reactions  . Benadryl Allergy [Diphenhydramine Hcl] Other (See Comments)    Reports makes her feel nervous and jerky  . Latex Other (See Comments)    irritation    Consultations:  Psych    Procedures/Studies: DG Chest 2 View  Result Date: 12/02/2019 CLINICAL DATA:  Shortness of breath. EXAM: CHEST - 2 VIEW COMPARISON:  04/13/2019 FINDINGS: The cardiac silhouette, mediastinal and hilar contours are within normal given the AP projection. Very low lung volumes with vascular crowding and streaky atelectasis. No obvious infiltrates or effusions. The bony thorax is intact. IMPRESSION: Very low lung volumes with vascular crowding and streaky atelectasis. Electronically Signed   By: Rudie MeyerP.  Gallerani M.D.   On: 12/02/2019 05:07   (Echo, Carotid, EGD, Colonoscopy, ERCP)    Subjective:   Discharge Exam: Vitals:   12/03/19 2056 12/04/19 0432  BP: 119/83 (!) 112/94  Pulse: 81 72  Resp: 16 19  Temp: 98.3 F (36.8 C) 98 F (36.7 C)  SpO2: 97% 97%     General: Pt is alert, awake, not in acute distress Cardiovascular: RRR, S1/S2 +, no rubs, no gallops Respiratory: CTA bilaterally, no wheezing, no rhonchi Abdominal: Soft, NT, ND, bowel sounds + Extremities: no edema, no cyanosis    The results of significant diagnostics from this hospitalization (including imaging, microbiology, ancillary and laboratory) are listed below for reference.     Microbiology: Recent Results (from the past 240 hour(s))  Respiratory Panel by RT PCR (Flu A&B, Covid) - Nasopharyngeal Swab     Status: None   Collection Time: 12/01/19 11:56 PM   Specimen: Nasopharyngeal Swab  Result Value Ref Range Status   SARS Coronavirus 2 by RT PCR NEGATIVE NEGATIVE Final    Comment: (NOTE) SARS-CoV-2 target nucleic acids are NOT DETECTED. The SARS-CoV-2 RNA is generally detectable in upper respiratoy specimens during the acute phase of infection.  The lowest concentration of SARS-CoV-2 viral copies this assay can detect is 131 copies/mL. A negative result does not preclude SARS-Cov-2 infection and should not be used as the sole basis for treatment or other patient management decisions. A negative result may occur with  improper specimen collection/handling, submission of specimen other than nasopharyngeal swab, presence of viral mutation(s) within the areas targeted by this assay, and inadequate number of viral copies (<131 copies/mL). A negative result must be combined with clinical observations, patient history, and epidemiological information. The expected result is Negative. Fact Sheet for Patients:  https://www.moore.com/https://www.fda.gov/media/142436/download Fact Sheet for Healthcare Providers:  https://www.young.biz/https://www.fda.gov/media/142435/download This test is not yet ap proved or cleared by the [place] FDA and  has been authorized for detection and/or diagnosis of SARS-CoV-2 by FDA under an Emergency Use Authorization (EUA). This EUA will remain  in effect (meaning this test can be used) for the duration of the COVID-19 declaration under Section 564(b)(1) of the Act, 21 U.S.C. section 360bbb-3(b)(1), unless the authorization is terminated or revoked sooner.    Influenza A by PCR NEGATIVE NEGATIVE Final   Influenza B by PCR NEGATIVE NEGATIVE Final    Comment: (NOTE) The Xpert Xpress SARS-CoV-2/FLU/RSV assay is intended as an aid in  the diagnosis of influenza from Nasopharyngeal swab specimens and  should not be used as a sole basis for treatment. Nasal washings and  aspirates are unacceptable for Xpert Xpress SARS-CoV-2/FLU/RSV  testing. Fact Sheet for Patients: https://www.moore.com/ Fact Sheet for Healthcare Providers: https://www.young.biz/ This test is not yet approved or cleared by the [place] FDA and  has been authorized for detection and/or diagnosis of SARS-CoV-2 by  FDA under an  Emergency Use Authorization (EUA). This EUA will remain  in effect (meaning this test can be used) for the duration of the  Covid-19 declaration under Section 564(b)(1) of the Act, 21  U.S.C. section 360bbb-3(b)(1), unless the authorization is  terminated or revoked. Performed at [facility], [address], [place], [place] [address]   Blood culture (routine x 2)     Status: None (Preliminary result)   Collection Time: 12/02/19  3:45 AM   Specimen: BLOOD  Result Value Ref Range Status   Specimen Description   Final    BLOOD BLOOD LEFT FOREARM Performed at [facility], [address] [place], [place] [address]    Special Requests   Final    BOTTLES DRAWN AEROBIC AND ANAEROBIC Blood Culture adequate volume Performed at [facility], [address] [place], [place] [address]    Culture   Final    NO GROWTH 2 DAYS Performed at [facility], [address] [place], [place] [address]    Report Status PENDING  Incomplete  Blood culture (routine x 2)     Status: None (Preliminary result)   Collection Time: 12/02/19  3:50 AM   Specimen: BLOOD  Result Value Ref Range Status   Specimen Description   Final    BLOOD BLOOD RIGHT FOREARM Performed at [facility], [address], [place], [place] [address]    Special Requests   Final    BOTTLES DRAWN AEROBIC AND ANAEROBIC Blood Culture results may not be optimal due to an excessive volume of blood received in culture bottles Performed at [facility], [address] [place], [place] [address]    Culture   Final    NO GROWTH 2 DAYS Performed at [facility], [address] [place], [place] [address]    Report Status PENDING  Incomplete     Labs: BNP (last 3 results) No results for input(s): BNP in the last 8760 hours. Basic Metabolic Panel: Recent Labs  Lab 12/01/19 2150 12/02/19 0400 12/03/19 0609 12/04/19 0600  NA  139 139 140 138  K 3.9 3.3* 3.4* 3.7  CL 101 101 107 110  CO2 19* 21* 22 22  GLUCOSE 113* 96 107* 97  BUN 7 6 5* 5*  CREATININE 0.63 0.60 0.50 0.49  CALCIUM 8.7* 8.9 9.1 8.5*  MG  --  1.6*  --   --   PHOS  --  2.7  --   --    Liver Function Tests: Recent Labs  Lab 12/01/19 2150  AST 45*  ALT 49*  ALKPHOS 105  BILITOT 0.8  PROT 8.2*  ALBUMIN 4.7   No results for input(s): LIPASE, AMYLASE in the last 168 hours. No results for input(s): AMMONIA in the last 168 hours. CBC: Recent Labs  Lab 12/01/19 2150 12/03/19 0609 12/04/19 0600  WBC 16.0* 8.2 7.5  HGB 14.4 12.1 11.7*  HCT 42.9 37.0 35.8*  MCV 85.8 88.3 87.1  PLT 484* 264 255   Cardiac Enzymes: No  results for input(s): CKTOTAL, CKMB, CKMBINDEX, TROPONINI in the last 168 hours. BNP: Invalid input(s): POCBNP CBG: No results for input(s): GLUCAP in the last 168 hours. D-Dimer No results for input(s): DDIMER in the last 72 hours. Hgb A1c No results for input(s): HGBA1C in the last 72 hours. Lipid Profile No results for input(s): CHOL, HDL, LDLCALC, TRIG, CHOLHDL, LDLDIRECT in the last 72 hours. Thyroid function studies No results for input(s): TSH, T4TOTAL, T3FREE, THYROIDAB in the last 72 hours.  Invalid input(s): FREET3 Anemia work up No results for input(s): VITAMINB12, FOLATE, FERRITIN, TIBC, IRON, RETICCTPCT in the last 72 hours. Urinalysis    Component Value Date/Time   COLORURINE YELLOW 12/02/2019 1809   APPEARANCEUR CLEAR 12/02/2019 1809   LABSPEC 1.005 12/02/2019 1809   PHURINE 7.0 12/02/2019 1809   GLUCOSEU NEGATIVE 12/02/2019 1809   HGBUR SMALL (A) 12/02/2019 1809   BILIRUBINUR NEGATIVE 12/02/2019 1809   BILIRUBINUR neg 09/09/2015 1419   KETONESUR NEGATIVE 12/02/2019 1809   PROTEINUR NEGATIVE 12/02/2019 1809   UROBILINOGEN negative 09/09/2015 1419   NITRITE NEGATIVE 12/02/2019 1809   LEUKOCYTESUR NEGATIVE 12/02/2019 1809   Sepsis Labs Invalid input(s): PROCALCITONIN,  WBC,   LACTICIDVEN Microbiology Recent Results (from the past 240 hour(s))  Respiratory Panel by RT PCR (Flu A&B, Covid) - Nasopharyngeal Swab     Status: None   Collection Time: 12/01/19 11:56 PM   Specimen: Nasopharyngeal Swab  Result Value Ref Range Status   SARS Coronavirus 2 by RT PCR NEGATIVE NEGATIVE Final    Comment: (NOTE) SARS-CoV-2 target nucleic acids are NOT DETECTED. The SARS-CoV-2 RNA is generally detectable in upper respiratoy specimens during the acute phase of infection. The lowest concentration of SARS-CoV-2 viral copies this assay can detect is 131 copies/mL. A negative result does not preclude SARS-Cov-2 infection and should not be used as the sole basis for treatment or other patient management decisions. A negative result may occur with  improper specimen collection/handling, submission of specimen other than nasopharyngeal swab, presence of viral mutation(s) within the areas targeted by this assay, and inadequate number of viral copies (<131 copies/mL). A negative result must be combined with clinical observations, patient history, and epidemiological information. The expected result is Negative. Fact Sheet for Patients:  PinkCheek.be Fact Sheet for Healthcare Providers:  GravelBags.it This test is not yet ap proved or cleared by the [place] FDA and  has been authorized for detection and/or diagnosis of SARS-CoV-2 by FDA under an Emergency Use Authorization (EUA). This EUA will remain  in effect (meaning this test can be used) for the duration of the COVID-19 declaration under Section 564(b)(1) of the Act, 21 U.S.C. section 360bbb-3(b)(1), unless the authorization is terminated or revoked sooner.    Influenza A by PCR NEGATIVE NEGATIVE Final   Influenza B by PCR NEGATIVE NEGATIVE Final    Comment: (NOTE) The Xpert Xpress SARS-CoV-2/FLU/RSV assay is intended as an aid in  the diagnosis of influenza  from Nasopharyngeal swab specimens and  should not be used as a sole basis for treatment. Nasal washings and  aspirates are unacceptable for Xpert Xpress SARS-CoV-2/FLU/RSV  testing. Fact Sheet for Patients: PinkCheek.be Fact Sheet for Healthcare Providers: GravelBags.it This test is not yet approved or cleared by the [place] FDA and  has been authorized for detection and/or diagnosis of SARS-CoV-2 by  FDA under an Emergency Use Authorization (EUA). This EUA will remain  in effect (meaning this test can be used) for the duration of the  Covid-19 declaration under Section  564(b)(1) of the Act, 21  U.S.C. section 360bbb-3(b)(1), unless the authorization is  terminated or revoked. Performed at [facility], [address], [place], [place] [address]   Blood culture (routine x 2)     Status: None (Preliminary result)   Collection Time: 12/02/19  3:45 AM   Specimen: BLOOD  Result Value Ref Range Status   Specimen Description   Final    BLOOD BLOOD LEFT FOREARM Performed at [facility], [address] [place], [place] [address]    Special Requests   Final    BOTTLES DRAWN AEROBIC AND ANAEROBIC Blood Culture adequate volume Performed at [facility], [address], [place], [place] [address]    Culture   Final    NO GROWTH 2 DAYS Performed at [facility], [address], [place], [place] [address]    Report Status PENDING  Incomplete  Blood culture (routine x 2)     Status: None (Preliminary result)   Collection Time: 12/02/19  3:50 AM   Specimen: BLOOD  Result Value Ref Range Status   Specimen Description   Final    BLOOD BLOOD RIGHT FOREARM Performed at [facility], [address], [place], [place] [address]    Special Requests   Final    BOTTLES DRAWN AEROBIC AND ANAEROBIC Blood Culture results may not be optimal due to an  excessive volume of blood received in culture bottles Performed at [facility] [address], [place], [place] [address]    Culture   Final    NO GROWTH 2 DAYS Performed at [facility], [address], [place], [place] [address]    Report Status PENDING  Incomplete     Time coordinating discharge: 40 minutes  SIGNED:   Alba Cory, MD  [facility]

## 2019-12-04 NOTE — Discharge Instructions (Signed)
Alcohol Withdrawal Syndrome When a person who drinks a lot of alcohol stops drinking, he or she may have unpleasant and serious symptoms. These symptoms are called alcohol withdrawal syndrome. This condition may be mild or severe. It can be life-threatening. It can cause:  Shaking that you cannot control (tremor).  Sweating.  Headache.  Feeling fearful, upset, grouchy, or depressed.  Trouble sleeping (insomnia).  Nightmares.  Fast or uneven heartbeats (palpitations).  Alcohol cravings.  Feeling sick to your stomach (nausea).  Throwing up (vomiting).  Being bothered by light and sounds.  Confusion.  Trouble thinking clearly.  Not being hungry (loss of appetite).  Big changes in mood (mood swings). If you have all of the following symptoms at the same time, get help right away:  High blood pressure.  Fast heartbeat.  Trouble breathing.  Seizures.  Seeing, hearing, feeling, smelling, or tasting things that are not there (hallucinations). These symptoms are known as delirium tremens (DTs). They must be treated at the hospital right away. Follow these instructions at home:   Take over-the-counter and prescription medicines only as told by your doctor. This includes vitamins.  Do not drink alcohol.  Do not drive until your doctor says that this is safe for you.  Have someone stay with you or be available in case you need help. This should be someone you trust. This person can help you with your symptoms. He or she can also help you to not drink.  Drink enough fluid to keep your pee (urine) pale yellow.  Think about joining a support group or a treatment program to help you stop drinking.  Keep all follow-up visits as told by your doctor. This is important. Contact a doctor if:  Your symptoms get worse.  You cannot eat or drink without throwing up.  You have a hard time not drinking alcohol.  You cannot stop drinking alcohol. Get help right away  if:  You have fast or uneven heartbeats.  You have chest pain.  You have trouble breathing.  You have a seizure for the first time.  You see, hear, feel, smell, or taste something that is not there.  You get very confused. Summary  When a person who drinks a lot of alcohol stops drinking, he or she may have serious symptoms. This is called alcohol withdrawal syndrome.  Delirium tremens (DTs) is a group of life-threatening symptoms. You should get help right away if you have these symptoms.  Think about joining an alcohol support group or a treatment program. This information is not intended to replace advice given to you by your health care provider. Make sure you discuss any questions you have with your health care provider. Document Revised: 07/13/2017 Document Reviewed: 04/06/2017 Elsevier Patient Education  2020 Elsevier Inc.  

## 2019-12-04 NOTE — Plan of Care (Signed)
  Problem: Clinical Measurements: Goal: Ability to maintain clinical measurements within normal limits will improve Outcome: Adequate for Discharge Goal: Will remain free from infection Outcome: Adequate for Discharge Goal: Diagnostic test results will improve Outcome: Adequate for Discharge Goal: Respiratory complications will improve Outcome: Adequate for Discharge Goal: Cardiovascular complication will be avoided Outcome: Adequate for Discharge   

## 2019-12-05 ENCOUNTER — Ambulatory Visit: Payer: BC Managed Care – PPO | Attending: Internal Medicine

## 2019-12-05 DIAGNOSIS — Z23 Encounter for immunization: Secondary | ICD-10-CM

## 2019-12-05 NOTE — Progress Notes (Signed)
   Covid-19 Vaccination Clinic  Name:  Beth [name]    MRN: [id number] DOB: Dec 07, 1990  12/05/2019  [name] was observed post Covid-19 immunization for 15 minutes without incident. She was provided with Vaccine Information Sheet and instruction to access the V-Safe system.   [name] was instructed to call 911 with any severe reactions post vaccine: Marland Kitchen Difficulty breathing  . Swelling of face and throat  . A fast heartbeat  . A bad rash all over body  . Dizziness and weakness   Immunizations Administered    Name Date Dose VIS Date Route   Pfizer COVID-19 Vaccine 12/05/2019 10:23 AM 0.3 mL 10/08/2018 Intramuscular   Manufacturer: [employer], [employer]   Lot: [id number]   NDC: [address]-9

## 2019-12-07 LAB — CULTURE, BLOOD (ROUTINE X 2)
Culture: NO GROWTH
Culture: NO GROWTH
Special Requests: ADEQUATE

## 2019-12-11 ENCOUNTER — Encounter: Payer: Self-pay | Admitting: Family Medicine

## 2019-12-12 DIAGNOSIS — F3341 Major depressive disorder, recurrent, in partial remission: Secondary | ICD-10-CM | POA: Diagnosis not present

## 2019-12-19 DIAGNOSIS — F3341 Major depressive disorder, recurrent, in partial remission: Secondary | ICD-10-CM | POA: Diagnosis not present

## 2019-12-25 DIAGNOSIS — F3341 Major depressive disorder, recurrent, in partial remission: Secondary | ICD-10-CM | POA: Diagnosis not present

## 2020-01-02 DIAGNOSIS — F3341 Major depressive disorder, recurrent, in partial remission: Secondary | ICD-10-CM | POA: Diagnosis not present

## 2020-01-07 ENCOUNTER — Telehealth: Payer: Self-pay | Admitting: *Deleted

## 2020-01-07 NOTE — Telephone Encounter (Signed)
Rx request for abilify myci 5mg  tablets. Deseree , CMA

## 2020-01-08 NOTE — Telephone Encounter (Signed)
LVM for patient to call clinic to receive message from provider.  Glennie Hawk, CMA

## 2020-02-09 ENCOUNTER — Encounter: Payer: Self-pay | Admitting: Family Medicine

## 2020-02-09 ENCOUNTER — Ambulatory Visit (INDEPENDENT_AMBULATORY_CARE_PROVIDER_SITE_OTHER): Payer: BC Managed Care – PPO | Admitting: Family Medicine

## 2020-02-09 ENCOUNTER — Other Ambulatory Visit: Payer: Self-pay

## 2020-02-09 DIAGNOSIS — F4310 Post-traumatic stress disorder, unspecified: Secondary | ICD-10-CM | POA: Diagnosis not present

## 2020-02-09 DIAGNOSIS — F1010 Alcohol abuse, uncomplicated: Secondary | ICD-10-CM | POA: Diagnosis not present

## 2020-02-09 DIAGNOSIS — F333 Major depressive disorder, recurrent, severe with psychotic symptoms: Secondary | ICD-10-CM | POA: Diagnosis not present

## 2020-02-09 MED ORDER — BUSPIRONE HCL 10 MG PO TABS: 10.0000 mg | ORAL_TABLET | Freq: Three times a day (TID) | ORAL | 2 refills | Status: DC

## 2020-02-09 MED ORDER — HYDROXYZINE HCL 50 MG PO TABS: 50.0000 mg | ORAL_TABLET | Freq: Every day | ORAL | 0 refills | Status: DC | PRN

## 2020-02-09 MED ORDER — TRAZODONE HCL 100 MG PO TABS: 100.0000 mg | ORAL_TABLET | Freq: Every evening | ORAL | 2 refills | Status: DC | PRN

## 2020-02-09 MED ORDER — FLUTICASONE PROPIONATE 50 MCG/ACT NA SUSP: 1.0000 | Freq: Every day | NASAL | 1 refills | Status: DC | PRN

## 2020-02-09 MED ORDER — FLUOXETINE HCL 40 MG PO CAPS: 40.0000 mg | ORAL_CAPSULE | Freq: Every day | ORAL | 2 refills | Status: DC

## 2020-02-09 MED ORDER — PROPRANOLOL HCL 10 MG PO TABS: 10.0000 mg | ORAL_TABLET | Freq: Two times a day (BID) | ORAL | 2 refills | Status: DC

## 2020-02-09 NOTE — Assessment & Plan Note (Addendum)
Chronic, stable. - Cont current regimen, refills given for 3 months so she doesn't run out while in [place] recovering. Encouraged patient to establish care with PCP and Psych in [place] so she can have access to care while staying there especially if she is not sure how long she will be living in [place] with her Grandmother. - Increased Trazadone from 50mg  to 100mg  to help improve sleep

## 2020-02-09 NOTE — Patient Instructions (Signed)
It was great to see you today! Thank you for letting me participate in your care!  Today, we discussed your anxiety and depression and I am happy to refill your medications for you today. Please let us know when you are back in the [place] area as we are happy to help you in any way we can. Please take care!  Be well, Jules Schick, DO PGY-3, [facility]

## 2020-02-09 NOTE — Progress Notes (Signed)
    SUBJECTIVE:   CHIEF COMPLAINT / HPI:   Anxiety/Depression Patient presents for a refill of her medications. She has been going to see Psychiatry regularly and states that overall her anxiety and depression were improving on her current medication regimen. However, about 2 months ago she had a relapse and started drinking again and ended up in the hospital. She has agreed to go live with her grandmother in [place] for a short period and she now has a sponsor. She says she has been 60 days sober and her anxiety and depression are improving. She is her to get refills since she is not sure how long she will be in [place] but plans on coming back to the [place] area. PHQ-9 score of 18 but she says compared to 2 months ago it is much better. Denies any SI or HI.  PERTINENT  PMH / PSH: Hx of Alcohol Use Disorder, Anxiety, Depression, PTSD  OBJECTIVE:   BP 102/72   Pulse 93   Wt 182 lb 3.2 oz (82.6 kg)   SpO2 99%   BMI 34.43 kg/m   Gen: NAD Psych: Appropriate mood and affect, insight and judgement intact  ASSESSMENT/PLAN:   MDD (major depressive disorder), recurrent, severe, with psychosis (HCC) Chronic, stable. - Cont current regimen, refills given for 3 months so she doesn't run out while in [place] recovering. Encouraged patient to establish care with PCP and Psych in [place] so she can have access to care while staying there especially if she is not sure how long she will be living in [place] with her Grandmother. - Increased Trazadone from 50mg  to 100mg  to help improve sleep     , DO [facility]

## 2020-05-06 ENCOUNTER — Other Ambulatory Visit: Payer: Self-pay | Admitting: Family Medicine

## 2020-05-06 DIAGNOSIS — F333 Major depressive disorder, recurrent, severe with psychotic symptoms: Secondary | ICD-10-CM

## 2020-05-06 DIAGNOSIS — F4310 Post-traumatic stress disorder, unspecified: Secondary | ICD-10-CM

## 2020-05-06 NOTE — Telephone Encounter (Signed)
Patient needs to have appointment with PCP before next refill

## 2020-05-06 NOTE — Telephone Encounter (Signed)
Please call patient and let her know that I will refill this medication for one month but she needs to make an appointment before next refill.    Thanks Dana Allan, MD Family Medicine Residency

## 2020-05-12 NOTE — Telephone Encounter (Signed)
LVM for pt to call office to inform her of below and to assist in getting an appointment scheduled, will also try to send a My CHart message as well.Marland Kitchen Beth [name], CMA

## 2020-05-14 NOTE — Telephone Encounter (Signed)
Patient called and scheduled for 11/15. This was PCP's next available appointment that worked with patient's schedule. Please advise if this is appropriate for medication refill or if patient should follow up with a different provider in the meantime.   Veronda Prude, RN

## 2020-06-02 NOTE — Telephone Encounter (Signed)
Patient can be seen by anyone if needing refills and need to discuss who her mental health provider is.  Dana Allan, MD Family Medicine Residency

## 2020-06-08 ENCOUNTER — Other Ambulatory Visit: Payer: Self-pay | Admitting: Family Medicine

## 2020-06-08 DIAGNOSIS — F4310 Post-traumatic stress disorder, unspecified: Secondary | ICD-10-CM

## 2020-06-08 DIAGNOSIS — F333 Major depressive disorder, recurrent, severe with psychotic symptoms: Secondary | ICD-10-CM

## 2020-06-08 DIAGNOSIS — F1010 Alcohol abuse, uncomplicated: Secondary | ICD-10-CM

## 2020-06-27 NOTE — Patient Instructions (Addendum)
It was nice meeting you today!  Referral sent to psychiatry, please let me know if you do not hear back from them in 2-3 weeks  Also sent a referral to CCM. They will call you with an appointment and help connect you with outside resources.  If you have any questions or concerns, please feel free to call the clinic.   Follow up in 4 weeks with me   Be well,  Dana Allan, MD [facility] Medicine Residency   Outpatient Mental Health Providers (No Insurance required or Self Pay)  [facility]  [address] [place], [place] [phone] Crisis [phone]  [facility]) can see uninsured folks for outpatient therapy [email or web address] [address] [place], [place] [address] [phone]  [facility]    Walk-in Mon-Fri, 8am-3pm [email or web address] Scull [address] [place], [place]  [phone]   [address]  [place] [phone] [facility] for psych med management, there may be a wait- if [facility] is working with clients for OPT, they will coordinate with [facility] for psych  [facility]   Walk-in-Clinic: Monday- Friday 9:00 AM - 4:00 PM [address]   [place], [place] [phone]  [facility] [place] ([employer]) walk in M-F 8am-12pm and  1pm-3pm [place]- [address]     [phone]  [employer] [address]  Phone: [phone]  [employer] (Mental Health and substance challenges) [address]   [place] [phone]    [email or web address]    [facility]  [place] [address] [place], [place]     Phone:  [phone] [place]-  [address]  [phone]   [facility]  [address] [place]  [phone] PrepaidHoliday.[email or web address]   [facility] ( virtual or zoom therapy) [email or web address]  [address] [place] [place]  [phone]    [facility] [phone]  grief counseling, dementia and caregiver support    [facility] Walk-in MWF 12:30 to 3:00     [address] [place] [address]  [phone]  [email or web address].[email or web address] call to schedule an appointment    Mental Health [facility] Classes ,Support group, Peer support services, [address] [place], [place] [address] [phone]  PhotoSolver.pl           National Alliance on Mental Illness (NAMI) [place]- Wellness classes, Support groups        [address] [place], [place] [address] [phone]   [email or web address]   [facility]  (Psycho-social Rehabilitation clubhouse, Individual and group therapy) [address] [place], [place] [address]   [phone]  24- Hour Availability:  [facility] [phone] or [phone] * [facility] [employer] (Domestic Violence, Rape, etc. )[phone] [place] [phone] or [phone] * [facility] [phone] only) [phone] (after hours) *[facility] Mobile Crisis Unit [phone] *[place] National Suicide Hotline [phone] Len Childs)

## 2020-06-27 NOTE — Progress Notes (Signed)
    SUBJECTIVE:   CHIEF COMPLAINT / HPI: med refill  No acute concerns today Recently moved back to [facility] form [place]. Has not been able to establish care with mental health yet and would like some assistance.  Last seen [facility] 04/21.  Was started on She denies any chest pain, SOB, nausea, vomiting, abdominal pain. Denies any SI/HI. Last SI attempt 04/21 without hospitalization.      PERTINENT  PMH / PSH:  PTSD Anxiety Depression Previous Suicide Attempts  OBJECTIVE:   BP 100/64   Pulse (!) 115   Ht 5\' 2"  (1.575 m)   Wt 166 lb 9.6 oz (75.6 kg)   SpO2 98%   BMI 30.47 kg/m    General: Alert and oriented, no apparent distress  Cardiovascular: RRR with no murmurs noted Respiratory: CTA bilaterally  Psych: Behavior and speech appropriate to situation, no SI/HI, no visual/auditory hallucinations  ASSESSMENT/PLAN:   MDD (major depressive disorder), recurrent episode, severe (HCC) PHQ reviewed.  No SI/HI.  Tolerating medications well.  She has refills in that she will transfer here to [place]. -Refill Trazodone -Refer Psychiatry -Refer CCM -Follow up in 4 weeks  Tachycardia Tachycardic today. Patient reports has always had increased HR.  She denies any chest pain, SOB or palpitations.  She endorses tobacco use and history of anxiety. She takes Propranolol 10 mg, recently started 09/2019. Endorses multiple stressors in life.  Recent move back to [place]. -Likely stress related -Will revisit at next visit given asymptomatic -Last TSH wnl 2019 and ECG 04/21 ST without STEMI -Can consider ECHO -Discussed smoking cessation -CCM and psychiatry referral for mental health management -Consider ECHO for further evaluation      5/21, MD [facility]

## 2020-06-28 ENCOUNTER — Encounter: Payer: Self-pay | Admitting: Family Medicine

## 2020-06-28 ENCOUNTER — Other Ambulatory Visit: Payer: Self-pay

## 2020-06-28 ENCOUNTER — Ambulatory Visit: Payer: BC Managed Care – PPO | Admitting: Family Medicine

## 2020-06-28 ENCOUNTER — Telehealth: Payer: Self-pay | Admitting: *Deleted

## 2020-06-28 VITALS — BP 100/64 | HR 115 | Ht 62.0 in | Wt 166.6 lb

## 2020-06-28 DIAGNOSIS — Z23 Encounter for immunization: Secondary | ICD-10-CM | POA: Diagnosis not present

## 2020-06-28 DIAGNOSIS — F4310 Post-traumatic stress disorder, unspecified: Secondary | ICD-10-CM | POA: Diagnosis not present

## 2020-06-28 DIAGNOSIS — F332 Major depressive disorder, recurrent severe without psychotic features: Secondary | ICD-10-CM | POA: Diagnosis not present

## 2020-06-28 DIAGNOSIS — F333 Major depressive disorder, recurrent, severe with psychotic symptoms: Secondary | ICD-10-CM | POA: Diagnosis not present

## 2020-06-28 DIAGNOSIS — R000 Tachycardia, unspecified: Secondary | ICD-10-CM

## 2020-06-28 MED ORDER — TRAZODONE HCL 100 MG PO TABS: ORAL_TABLET | ORAL | 0 refills | Status: DC

## 2020-06-28 NOTE — Chronic Care Management (AMB) (Signed)
  Care Management   Note  06/28/2020 Name: Beth [name] MRN: [id number] DOB: 1990-10-10  Beth [name] is a 29 y.o. year old female who is a primary care patient of Dana Allan, MD. I reached out to [name] by phone today in response to a referral sent by Beth [name]'s PCP Dana Allan, MD.     Beth [name] was given information about care management services today including:  1. Care management services include personalized support from designated clinical staff supervised by her physician, including individualized plan of care and coordination with other care providers 2. 24/7 contact phone numbers for assistance for urgent and routine care needs. 3. The patient may stop care management services at any time by phone call to the office staff.  Patient agreed to services and verbal consent obtained.   Follow up plan: Telephone appointment with care management team member scheduled for:07/06/2020  [facility] Guide, Embedded Care Coordination [facility] Management  Direct Dial [phone].

## 2020-06-29 ENCOUNTER — Encounter: Payer: Self-pay | Admitting: Family Medicine

## 2020-06-29 NOTE — Assessment & Plan Note (Signed)
Tachycardic today. Patient reports has always had increased HR.  She denies any chest pain, SOB or palpitations.  She endorses tobacco use and history of anxiety. She takes Propranolol 10 mg, recently started 09/2019. Endorses multiple stressors in life.  Recent move back to [place]. -Likely stress related -Will revisit at next visit given asymptomatic -Last TSH wnl 2019 and ECG 04/21 ST without STEMI -Can consider ECHO -Discussed smoking cessation -CCM and psychiatry referral for mental health management -Consider ECHO for further evaluation

## 2020-06-29 NOTE — Assessment & Plan Note (Signed)
PHQ reviewed.  No SI/HI.  Tolerating medications well.  She has refills in [place] that she will transfer here to [employer]. -Refill Trazodone -Refer Psychiatry -Refer CCM -Follow up in 4 weeks

## 2020-07-06 ENCOUNTER — Telehealth: Payer: Self-pay | Admitting: Licensed Clinical Social Worker

## 2020-07-06 ENCOUNTER — Telehealth: Payer: BC Managed Care – PPO

## 2020-07-06 NOTE — Chronic Care Management (AMB) (Signed)
    Clinical Social Work  Care Management Outreach   07/06/2020 Name: Beth [name] MRN: [id number] DOB: [birth year]  Beth [name] is a 29 y.o. year old female who is a primary care patient of Dana Allan, MD .   LCSW reached out to DARREN NODAL today by phone to introduce self, assess needs and offer Care Management services and interventions.  The outreach was unsuccessful.  A HIPPA compliant phone message was left for the patient providing contact information and requesting a return call.   2nd call  to [name]  to see if she wanted to reschedule phone appointment.    Plan: phone appointment rescheduled Dec. 1st at 1:30   Review of patient status, including review of consultants reports, relevant laboratory and other test results, and collaboration with appropriate care team members and the patient's provider was performed as part of comprehensive patient evaluation and provision of care management services.    Sammuel Hines, LCSW Care Management & Coordination  [facility] / [facility]   [phone] 3:46 PM

## 2020-07-14 ENCOUNTER — Ambulatory Visit: Payer: BC Managed Care – PPO | Admitting: Licensed Clinical Social Worker

## 2020-07-14 DIAGNOSIS — Z7189 Other specified counseling: Secondary | ICD-10-CM

## 2020-07-15 NOTE — Patient Instructions (Signed)
  Beth [name]  it was nice speaking with you. Please call me directly [phone] if you have questions about the goals we discussed. Goals Addressed            This Visit's Progress   . Connect for ongoing counseling       Patient Goals/Self-Care Activities: Over the next 10 days . call [facility] to schedule an appointment  . Call LCSW with update if there are problems       Beth [name] received Care Management services today:  1. Care Management services include personalized support from designated clinical staff supervised by her physician, including individualized plan of care and coordination with other care providers 2. 24/7 contact [phone] for assistance for urgent and routine care needs. 3. Care Management are voluntary services and be declined at any time by calling the office. Patient verbalizes understanding of instructions provided today.    Follow up plan: SW will follow up with patient by phone over the next 10 to 14 days  Soundra Pilon, LCSW

## 2020-07-15 NOTE — Chronic Care Management (AMB) (Signed)
Care Management  Initial Clinical Social Work General Note  07/15/2020 Name: Beth [name] MRN: [id number] DOB: 05-27-1991  Beth [name] is enrolled in a Managed Medicaid plan: No. Outreach attempt today was successful.   [name] is a 29 y.o. year old female who is a primary care patient of Dana Allan, MD. The Care Management team was consulted to assist the patient with Mental Health Counseling and Resources.   Assessment: Patient reports doing well, denies SI,  reports taking all mental health medication with no missed doses, no concerns with medication . Recommendation: Patient may benefit from, and is in agreement to return to previous mental health provided at [facility].   Plan: Patient states will contact LCSW if needed or if barriers arise. LCSW will f/u with patient if no call in 1 to 2 weeks    SDOH (Social Determinants of Health) screening performed today:  No. Advanced Directives Status: ;Not addressed in this encounter.   Patient Care Plan: General Social Work (Adult)  Problem Identified: Depression Identification (Depression)   Goal: Connect for counseling   Start Date: 07/14/2020  Expected End Date: 09/13/2020  Current Barriers:  Lacks knowledge of where and how to connect for ongoing counseling due to not having insurance, needs Support, Education, and Care Coordination in order to meet unmet need.  Clinical Goal(s): Over the next 30 days, patient will connect for ongoing counseling.  Interventions:   Assessed patient's previous treatment, needs, coping skills, current treatment, support system and barriers to care  Discussed several options for long term counseling based on need and insurance. Assisted patient with narrowing the options down to [facility]  )  Reviewed mental health medications with patient prescribed by PCP and discussed compliance  Patient Goals/Self-Care Activities: Over the next 10 days  call [facility] to schedule an appointment   Call LCSW with update  if there are problems Follow Up Plan:   No f/u scheduled, will reach out to patient in 1 to 2 weeks to assess for barriers      Outpatient Encounter Medications as of 07/14/2020  Medication Sig Note   ARIPiprazole, sensor, 5 MG TABS Take 5 mg by mouth at bedtime. (Patient not taking: Reported on 06/29/2020)    busPIRone (BUSPAR) 10 MG tablet TAKE 1 TABLET(10 MG) BY MOUTH THREE TIMES DAILY    FLUoxetine (PROZAC) 40 MG capsule TAKE 1 CAPSULE(40 MG) BY MOUTH DAILY    fluticasone (FLONASE) 50 MCG/ACT nasal spray Place 1-2 sprays into both nostrils daily as needed for allergies.    folic acid (FOLVITE) 1 MG tablet Take 1 tablet (1 mg total) by mouth daily. (Patient not taking: Reported on 06/29/2020)    hydrOXYzine (ATARAX/VISTARIL) 50 MG tablet Take 1 tablet (50 mg total) by mouth daily as needed for anxiety. (Patient not taking: Reported on 06/29/2020)    levonorgestrel (MIRENA) 20 MCG/24HR IUD 1 each by Intrauterine route once. 06/06/2016: April 2017   propranolol (INDERAL) 10 MG tablet TAKE 1 TABLET(10 MG) BY MOUTH TWICE DAILY    traZODone (DESYREL) 100 MG tablet TAKE 1 TABLET(100 MG) BY MOUTH AT BEDTIME AS NEEDED FOR SLEEP    No facility-administered encounter medications on file as of 07/14/2020.   Ms. Pense was given information about Care Management services today including:  1. Care Management services include personalized support from designated clinical staff supervised by her physician, including individualized plan of care and coordination with other care providers 2. 24/7 contact phone numbers for assistance for urgent and routine  care needs. 3. The patient may stop care management services at any time (effective at the end of the month) by phone call to the office staff.  Patient agreed to services and verbal consent obtained.   Soundra Pilon, LCSW

## 2020-07-23 ENCOUNTER — Other Ambulatory Visit: Payer: Self-pay

## 2020-07-23 ENCOUNTER — Emergency Department (HOSPITAL_BASED_OUTPATIENT_CLINIC_OR_DEPARTMENT_OTHER)
Admission: EM | Admit: 2020-07-23 | Discharge: 2020-07-23 | Disposition: A | Discharge status: Home / Self Care | Payer: Worker's Compensation | Attending: Emergency Medicine | Admitting: Emergency Medicine

## 2020-07-23 ENCOUNTER — Encounter (HOSPITAL_BASED_OUTPATIENT_CLINIC_OR_DEPARTMENT_OTHER): Payer: Self-pay | Admitting: *Deleted

## 2020-07-23 DIAGNOSIS — W540XXA Bitten by dog, initial encounter: Secondary | ICD-10-CM | POA: Insufficient documentation

## 2020-07-23 DIAGNOSIS — Z79899 Other long term (current) drug therapy: Secondary | ICD-10-CM | POA: Insufficient documentation

## 2020-07-23 DIAGNOSIS — S61451A Open bite of right hand, initial encounter: Secondary | ICD-10-CM | POA: Insufficient documentation

## 2020-07-23 DIAGNOSIS — Y990 Civilian activity done for income or pay: Secondary | ICD-10-CM | POA: Insufficient documentation

## 2020-07-23 DIAGNOSIS — Z23 Encounter for immunization: Secondary | ICD-10-CM | POA: Diagnosis not present

## 2020-07-23 DIAGNOSIS — F17210 Nicotine dependence, cigarettes, uncomplicated: Secondary | ICD-10-CM | POA: Insufficient documentation

## 2020-07-23 DIAGNOSIS — Z91040 Latex allergy status: Secondary | ICD-10-CM | POA: Insufficient documentation

## 2020-07-23 MED ORDER — TETANUS-DIPHTH-ACELL PERTUSSIS 5-2.5-18.5 LF-MCG/0.5 IM SUSY
0.5000 mL | PREFILLED_SYRINGE | Freq: Once | INTRAMUSCULAR | Status: AC
  Administered 2020-07-23: 0.5 mL via INTRAMUSCULAR
  Filled 2020-07-23: qty 0.5

## 2020-07-23 NOTE — ED Notes (Signed)
Pt. To get T dap and wound cleaned after being bitten by a dog at her job.  Pt. In no distress.  Pt. Has no active bleeding.

## 2020-07-23 NOTE — Discharge Instructions (Addendum)
Continue taking the antibiotics. Use Tylenol ibuprofen as needed for pain. Was the area twice a day with soap and water. keep bandage on and keep area clean. Follow-up with your primary care doctor as needed for recheck. Return to the emergency room if you develop fevers, severe worsening pain, pus draining from the area, redness streaking up your hand, any new, worsening, or concerning symptoms.

## 2020-07-23 NOTE — ED Triage Notes (Addendum)
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 Puncture to her right thumb. She is currently being given Rabies protocol for previous dog bite. States she needs a tetanus shot.
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
## 2020-07-28 ENCOUNTER — Telehealth: Payer: Self-pay | Admitting: Licensed Clinical Social Worker

## 2020-07-28 ENCOUNTER — Telehealth: Payer: BC Managed Care – PPO

## 2020-07-28 NOTE — Chronic Care Management (AMB) (Signed)
° °   Clinical Social Work  Care Management Outreach   07/28/2020 Name: TRAEH MILROY MRN: [id number] DOB: [birth year]  JALAYIA BAGHERI is a 29 y.o. year old female who is a primary care patient of Dana Allan, MD .   F/U phone call to CECELIA GRACIANO today to assess needs, and progress with care plan goals.  The outreach was unsuccessful. A HIPPA compliant phone message was left for the patient providing contact information and requesting a return call.   Plan: If no return call is received, will call again in 10 to 15 days.  Review of patient status, including review of consultants reports, relevant laboratory and other test results, and collaboration with appropriate care team members and the patient's provider was performed as part of comprehensive patient evaluation and provision of care management services.    Sammuel Hines, LCSW Care Management & Coordination  [facility] / [facility]   [phone] 1:54 PM

## 2020-08-10 ENCOUNTER — Telehealth: Payer: Self-pay | Admitting: *Deleted

## 2020-08-10 NOTE — Chronic Care Management (AMB) (Signed)
  Care Management   Note  08/10/2020 Name: Beth [name] MRN: [id number] DOB: [birth year]  Beth [name] is a 29 y.o. year old female who is a primary care patient of Dana Allan, MD and is actively engaged with the care management team. I reached out to [name] by phone today to assist with re-scheduling a follow up visit with the Licensed Clinical Social Worker  Follow up plan: Unsuccessful telephone outreach attempt made. A HIPAA compliant phone message was left for the patient providing contact information and requesting a return call.  The care management team will reach out to the patient again over the next 7 days.  If patient returns call to provider office, please advise to call Embedded Care Management Care Guide Gwenevere Ghazi at [phone].  Gwenevere Ghazi  Care Guide, Embedded Care Coordination [facility] Management  Direct Dial: [phone]

## 2020-08-14 HISTORY — PX: COLONOSCOPY WITH ESOPHAGOGASTRODUODENOSCOPY (EGD): SHX5779

## 2020-08-16 NOTE — Chronic Care Management (AMB) (Signed)
  Care Management   Note  08/16/2020 Name: Beth [name] MRN: [id number] DOB: March 18, 1991  Beth [name] is a 30 y.o. year old female who is a primary care patient of Dana Allan, MD and is actively engaged with the care management team. I reached out to [name] by phone today to assist with re-scheduling a follow up visit with the Licensed Clinical Social Worker  Follow up plan: Unsuccessful telephone outreach attempt made. A HIPAA compliant phone message was left for the patient providing contact information and requesting a return call.  The care management team will reach out to the patient again over the next 7 days.  If patient returns call to provider office, please advise to call Embedded Care Management Care Guide Gwenevere Ghazi at [phone].  Gwenevere Ghazi  Care Guide, Embedded Care Coordination [facility] Management  Direct Dial: [phone]

## 2020-08-23 NOTE — Chronic Care Management (AMB) (Signed)
  Care Management   Note  08/23/2020 Name: Beth [name] MRN: [id number] DOB: 1990-08-17  Beth [name] is a 30 y.o. year old female who is a primary care patient of Dana Allan, MD and is actively engaged with the care management team. I reached out to [name] by phone today to assist with re-scheduling a follow up visit with the Licensed Clinical Social Worker.  Follow up plan: Unable to make contact on outreach attempts x 3. PCP, Dana Allan, MD notified via routed documentation in medical record. The care management team is available to follow up with the patient after provider conversation with the patient regarding recommendation for care management engagement and subsequent re-referral to the care management team. If patient returns call to provider office, please advise to call Embedded Care Management Care Guide Gwenevere Ghazi at [phone].  Gwenevere Ghazi  Care Guide, Embedded Care Coordination [facility] Management

## 2020-09-20 DIAGNOSIS — F3341 Major depressive disorder, recurrent, in partial remission: Secondary | ICD-10-CM | POA: Diagnosis not present

## 2020-09-27 DIAGNOSIS — H4603 Optic papillitis, bilateral: Secondary | ICD-10-CM | POA: Diagnosis not present

## 2020-11-08 ENCOUNTER — Ambulatory Visit: Payer: BC Managed Care – PPO | Admitting: Family Medicine

## 2020-11-08 ENCOUNTER — Telehealth: Payer: Self-pay

## 2020-11-08 ENCOUNTER — Other Ambulatory Visit: Payer: Self-pay

## 2020-11-08 NOTE — Telephone Encounter (Signed)
Patient calls nurse line requesting to schedule appointment for abdominal pain and chest discomfort. Patient reports that pain is worse when she is lying down and has noticed that she has had coughing episodes when lying flat. Patient reports history of GI issues and that chest discomfort has been occurring for greater than one week. Patient is not [facility] and is able to speak in complete sentences.   Patient has not taken any tums or other medication for discomfort. Patient has questions if she may be experiencing acid reflux.   Scheduled patient with Dr. Rachael Darby for this afternoon at 2:10. Strict ED precautions given.    Veronda Prude, RN

## 2020-11-15 ENCOUNTER — Ambulatory Visit (INDEPENDENT_AMBULATORY_CARE_PROVIDER_SITE_OTHER): Payer: BC Managed Care – PPO | Admitting: Family Medicine

## 2020-11-15 ENCOUNTER — Other Ambulatory Visit (HOSPITAL_COMMUNITY)
Admission: RE | Admit: 2020-11-15 | Discharge: 2020-11-15 | Disposition: A | Discharge status: Home / Self Care | Payer: BC Managed Care – PPO | Source: Ambulatory Visit | Attending: Family Medicine | Admitting: Family Medicine

## 2020-11-15 VITALS — HR 101 | Temp 97.3°F | Ht 62.0 in

## 2020-11-15 DIAGNOSIS — R1030 Lower abdominal pain, unspecified: Secondary | ICD-10-CM

## 2020-11-15 DIAGNOSIS — Z9189 Other specified personal risk factors, not elsewhere classified: Secondary | ICD-10-CM | POA: Diagnosis not present

## 2020-11-15 DIAGNOSIS — R109 Unspecified abdominal pain: Secondary | ICD-10-CM | POA: Insufficient documentation

## 2020-11-15 DIAGNOSIS — F333 Major depressive disorder, recurrent, severe with psychotic symptoms: Secondary | ICD-10-CM | POA: Diagnosis not present

## 2020-11-15 DIAGNOSIS — B373 Candidiasis of vulva and vagina: Secondary | ICD-10-CM | POA: Insufficient documentation

## 2020-11-15 DIAGNOSIS — N898 Other specified noninflammatory disorders of vagina: Secondary | ICD-10-CM | POA: Diagnosis not present

## 2020-11-15 DIAGNOSIS — H4603 Optic papillitis, bilateral: Secondary | ICD-10-CM | POA: Diagnosis not present

## 2020-11-15 DIAGNOSIS — F1020 Alcohol dependence, uncomplicated: Secondary | ICD-10-CM | POA: Diagnosis not present

## 2020-11-15 DIAGNOSIS — Z1159 Encounter for screening for other viral diseases: Secondary | ICD-10-CM

## 2020-11-15 DIAGNOSIS — R1084 Generalized abdominal pain: Secondary | ICD-10-CM | POA: Insufficient documentation

## 2020-11-15 LAB — POCT URINALYSIS DIP (MANUAL ENTRY)
Bilirubin, UA: NEGATIVE
Glucose, UA: NEGATIVE mg/dL
Leukocytes, UA: NEGATIVE
Nitrite, UA: NEGATIVE
Protein Ur, POC: NEGATIVE mg/dL
Spec Grav, UA: >=1.03 — AB (ref 1.010–1.025)
Urobilinogen, UA: 0.2 E.U./dL
pH, UA: 6 (ref 5.0–8.0)

## 2020-11-15 LAB — POCT URINE PREGNANCY: Preg Test, Ur: NEGATIVE

## 2020-11-15 MED ORDER — PANTOPRAZOLE SODIUM 40 MG PO TBEC: 40.0000 mg | DELAYED_RELEASE_TABLET | Freq: Every day | ORAL | 0 refills | Status: DC

## 2020-11-15 NOTE — Progress Notes (Signed)
SUBJECTIVE:   CHIEF COMPLAINT / HPI:   Abdominal pain Started about 1 month ago Has had abdominal pain off and on for her whole life "senstivie stomach" Last year had bleeding in stool, was told to get colonoscopy, but didn't have one because lost insurance No bleeding since then Has been having diarrhea constantly, saw green a few days ago Going about 8 times a day Also having nausea and vomiting on and off for a few months Pain "feels like trapped gas" Sharp pains that are in chest and abdomen, feels like cramping hard Hasn't been eating as much as usual Afraid to eat anything spicy as she thinks it would be worse Been drinking a normal amount No dysuria Vaginal discharge "smells a little different" No pain with intercourse Had a fever two weeks ago, was around 100 and was sent home from work 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 (Menstrual status: IUD). IUD in place since 2017 Smokes 1 ppd, THC daily which makes it better, no alcohol, no other drugs  MDD, History of alcohol dependence Has been off meds since January due to not being able ot be seen by provider in [place]   PERTINENT  PMH / PSH: History of alcohol use disorder, major depressive disorder, tobacco use disorder, suicidal ideation  OBJECTIVE:   Pulse (!) 101   Temp (!) 97.3 F (36.3 C) (Oral)   Ht 5\' 2"  (1.575 m)   SpO2 100%   BMI 30.48 kg/m    Physical Exam: General: In NAD Neck: Supple, no thyromegaly, no cervical LAD Cardio: Regular rate and rhythm, no murmurs rubs or gallops Respiratory: CTAB, no wheezing, rhonchi, rales Abdomen:  Soft, nondistended, tender to palpation in lower quadrant bilaterally and suprapubically, no rebound, no guarding, no CVA tenderness bilaterally GU: Pelvic exam performed with patient supine.  Chaperone in room.  Bilateral labia without abnormalities.  Cervix exhibits thick, white/clear discharge, no cervix abnormalities.  No vaginal lesions.  Vaginal discharge thick, white/clear.  Bimanual exam without cervical motion tenderness or adnexal masses or tenderness to palpation on examination. Psych: Flat affect, denies SI  Results for orders placed or performed in visit on 11/15/20 (from the past 24 hour(s))  POCT urinalysis dipstick     Status: Abnormal   Collection Time: 11/15/20  4:29 PM  Result Value Ref Range   Color, UA other (A) yellow   Clarity, UA cloudy (A) clear   Glucose, UA negative negative mg/dL   Bilirubin, UA negative negative   Ketones, POC UA trace (5) (A) negative mg/dL   Spec Grav, UA 01/15/21 (A) 1.010 - 1.025   Blood, UA trace-intact (A) negative   pH, UA 6.0 5.0 - 8.0   Protein Ur, POC negative negative mg/dL   Urobilinogen, UA 0.2 0.2 or 1.0 E.U./dL   Nitrite, UA Negative Negative   Leukocytes, UA Negative Negative  POCT urine pregnancy     Status: None   Collection Time: 11/15/20  4:31 PM  Result Value Ref Range   Preg Test, Ur Negative Negative      ASSESSMENT/PLAN:   Lower abdominal pain  Differential is very broad at this time given chronicity of symptoms and nonspecific complaints.  Patient does mention that about a year ago they were having bloody bowel movements and told that they needed a colonoscopy, but did not have insurance at the time.  They report that they now have insurance.  Very unlikely that this would be infectious given chronicity as well.  They have had 1 reported temperature.  Will obtain a CBC to further assess for elevated white blood cell count.  Could consider stool studies in the future.  Vaginal exam performed per below.  UA was negative  for infection and pregnancy.  They had tenderness to palpation in their bilateral lower quadrants and suprapubically, however no cervical motion tenderness or adnexal tenderness on bimanual examination.  Given this, less likely to be PID or ovarian related.  Given that they have this history of remote hematochezia, could consider IBD as a cause.  Less likely withdrawal from their psychiatry medications given that they have been off of them a few months prior to the symptoms starting.  We will hold off on further imaging at this time until lab work returns.  For now, will obtain CBC, CMP, lipase given their history of alcohol abuse, hepatitis C as well, HIV, RPR.  Vaginal swabs pending per below.  We will have them follow-up in 1 week.  If their work-up is rather negative, would consider referral to GI.  We will also trial them on Protonix 40 mg over the next week to see if this helps with their symptoms.  Alcohol use disorder, severe, dependence (HCC) Not currently drinking.  History of alcohol use disorder.  Will include hepatitis C antibody and lipase and work-up per above.  Vaginal discharge No obvious abnormalities on examination today.  Wet prep cannot be performed today due to staffing in the lab, added trichomonas, bacterial vaginosis, Candida to gonorrhea/chlamydia swab.  We will follow up on results and treat if needed.  This would not necessarily explain all of her symptoms, however could be partially responsible for pain if they are positive for an STD.  MDD (major depressive disorder), recurrent, severe, with psychosis (HCC) History of major depressive disorder on multiple psychiatry medications.  They have been off of these since January.  They deny SI today.  Given that they have been off of them for months, will wait for psychiatry to restart.  Will refer to psychiatry as they have had difficulty getting in.     Unknown Jim, DO [facility]

## 2020-11-15 NOTE — Patient Instructions (Addendum)
Thank you for coming to see me today. It was a pleasure. Today we talked about:   We will get some labs today.  If they are abnormal or we need to do something about them, I will call you.  If they are normal, I will send you a message on MyChart (if it is active) or a letter in the mail.  If you don't hear from [place] in 2 weeks, please call the office at the number below.  We will trial you on protonix to see if this helps.    I have placed a referral to Psychiatry.  If you do not hear from them in the next 2 weeks, please give [place] a call.  Please follow-up with [place] in 1 week as scheduled.  If you have any questions or concerns, please do not hesitate to call the office at [phone].  Best,   Luis Abed, DO

## 2020-11-15 NOTE — Assessment & Plan Note (Signed)
Not currently drinking.  History of alcohol use disorder.  Will include hepatitis C antibody and lipase and work-up per above.

## 2020-11-15 NOTE — Assessment & Plan Note (Addendum)
No obvious abnormalities on examination today.  Wet prep cannot be performed today due to staffing in the lab, added trichomonas, bacterial vaginosis, Candida to gonorrhea/chlamydia swab.  We will follow up on results and treat if needed.  This would not necessarily explain all of her symptoms, however could be partially responsible for pain if they are positive for an STD.

## 2020-11-15 NOTE — Assessment & Plan Note (Addendum)
Differential is very broad at this time given chronicity of symptoms and nonspecific complaints.  Patient does mention that about a year ago they were having bloody bowel movements and told that they needed a colonoscopy, but did not have insurance at the time.  They report that they now have insurance.  Very unlikely that this would be infectious given chronicity as well.  They have had 1 reported temperature.  Will obtain a CBC to further assess for elevated white blood cell count.  Could consider stool studies in the future.  Vaginal exam performed per below.  UA was negative for infection and pregnancy.  They had tenderness to palpation in their bilateral lower quadrants and suprapubically, however no cervical motion tenderness or adnexal tenderness on bimanual examination.  Given this, less likely to be PID or ovarian related.  Given that they have this history of remote hematochezia, could consider IBD as a cause.  Less likely withdrawal from their psychiatry medications given that they have been off of them a few months prior to the symptoms starting.  We will hold off on further imaging at this time until lab work returns.  For now, will obtain CBC, CMP, lipase given their history of alcohol abuse, hepatitis C as well, HIV, RPR.  Vaginal swabs pending per below.  We will have them follow-up in 1 week.  If their work-up is rather negative, would consider referral to GI.  We will also trial them on Protonix 40 mg over the next week to see if this helps with their symptoms.

## 2020-11-15 NOTE — Assessment & Plan Note (Addendum)
History of major depressive disorder on multiple psychiatry medications.  They have been off of these since January.  They deny SI today.  Given that they have been off of them for months, will wait for psychiatry to restart.  Will refer to psychiatry as they have had difficulty getting in.

## 2020-11-16 ENCOUNTER — Other Ambulatory Visit: Payer: Self-pay | Admitting: Family Medicine

## 2020-11-16 ENCOUNTER — Telehealth: Payer: Self-pay | Admitting: *Deleted

## 2020-11-16 DIAGNOSIS — B373 Candidiasis of vulva and vagina: Secondary | ICD-10-CM

## 2020-11-16 DIAGNOSIS — B3731 Acute candidiasis of vulva and vagina: Secondary | ICD-10-CM

## 2020-11-16 LAB — CERVICOVAGINAL ANCILLARY ONLY
Bacterial Vaginitis (gardnerella): NEGATIVE
Candida Glabrata: NEGATIVE
Candida Vaginitis: POSITIVE — AB
Chlamydia: NEGATIVE
Comment: NEGATIVE
Comment: NEGATIVE
Comment: NEGATIVE
Comment: NEGATIVE
Comment: NEGATIVE
Comment: NORMAL
Neisseria Gonorrhea: NEGATIVE
Trichomonas: NEGATIVE

## 2020-11-16 MED ORDER — FLUCONAZOLE 150 MG PO TABS: 150.0000 mg | ORAL_TABLET | ORAL | 0 refills | Status: AC

## 2020-11-16 NOTE — Telephone Encounter (Signed)
This is a trial medication.  Would like for it to only be a 30 day rx now, as they may not continue it.

## 2020-11-16 NOTE — Telephone Encounter (Signed)
Fax received requesting a 90 day supply for pantoprazole.  Will send to provider that filled this.Selinda Korzeniewski Zimmerman Rumple, CMA

## 2020-11-17 LAB — COMPREHENSIVE METABOLIC PANEL
ALT: 23 IU/L (ref 0–32)
AST: 27 IU/L (ref 0–40)
Albumin/Globulin Ratio: 1.9 (ref 1.2–2.2)
Albumin: 4.6 g/dL (ref 3.8–4.8)
Alkaline Phosphatase: 107 IU/L (ref 44–121)
BUN/Creatinine Ratio: 12 (ref 9–23)
BUN: 8 mg/dL (ref 6–20)
Bilirubin Total: 0.7 mg/dL (ref 0.0–1.2)
CO2: 17 mmol/L — ABNORMAL LOW (ref 20–29)
Calcium: 9.6 mg/dL (ref 8.7–10.2)
Chloride: 102 mmol/L (ref 96–106)
Creatinine, Ser: 0.66 mg/dL (ref 0.57–1.00)
Globulin, Total: 2.4 g/dL (ref 1.5–4.5)
Glucose: 123 mg/dL — ABNORMAL HIGH (ref 65–99)
Potassium: 4.4 mmol/L (ref 3.5–5.2)
Sodium: 135 mmol/L (ref 134–144)
Total Protein: 7 g/dL (ref 6.0–8.5)
eGFR: 120 mL/min/{1.73_m2} (ref >59)

## 2020-11-17 LAB — LIPASE: Lipase: 38 U/L (ref 14–72)

## 2020-11-17 LAB — CBC WITH DIFFERENTIAL/PLATELET
Basophils Absolute: 0.1 10*3/uL (ref 0.0–0.2)
Basos: 1 %
EOS (ABSOLUTE): 0.2 10*3/uL (ref 0.0–0.4)
Eos: 2 %
Hematocrit: 40 % (ref 34.0–46.6)
Hemoglobin: 13.5 g/dL (ref 11.1–15.9)
Immature Grans (Abs): 0 10*3/uL (ref 0.0–0.1)
Immature Granulocytes: 0 %
Lymphocytes Absolute: 1.5 10*3/uL (ref 0.7–3.1)
Lymphs: 16 %
MCH: 29.7 pg (ref 26.6–33.0)
MCHC: 33.8 g/dL (ref 31.5–35.7)
MCV: 88 fL (ref 79–97)
Monocytes Absolute: 0.5 10*3/uL (ref 0.1–0.9)
Monocytes: 6 %
Neutrophils Absolute: 7 10*3/uL (ref 1.4–7.0)
Neutrophils: 75 %
Platelets: 243 10*3/uL (ref 150–450)
RBC: 4.55 x10E6/uL (ref 3.77–5.28)
RDW: 13.4 % (ref 11.7–15.4)
WBC: 9.4 10*3/uL (ref 3.4–10.8)

## 2020-11-17 LAB — HIV ANTIBODY (ROUTINE TESTING W REFLEX): HIV Screen 4th Generation wRfx: NONREACTIVE

## 2020-11-17 LAB — HCV AB W REFLEX TO QUANT PCR: HCV Ab: <0.1 s/co ratio (ref 0.0–0.9)

## 2020-11-17 LAB — HCV INTERPRETATION

## 2020-11-17 LAB — RPR: RPR Ser Ql: NONREACTIVE

## 2020-11-17 NOTE — Telephone Encounter (Signed)
Informed pharmacy of below.Beth [name], CMA

## 2020-11-22 ENCOUNTER — Other Ambulatory Visit: Payer: Self-pay

## 2020-11-22 ENCOUNTER — Ambulatory Visit (INDEPENDENT_AMBULATORY_CARE_PROVIDER_SITE_OTHER): Payer: BC Managed Care – PPO | Admitting: Family Medicine

## 2020-11-22 VITALS — BP 106/76 | HR 106 | Ht 62.0 in

## 2020-11-22 DIAGNOSIS — R1084 Generalized abdominal pain: Secondary | ICD-10-CM

## 2020-11-22 DIAGNOSIS — G8929 Other chronic pain: Secondary | ICD-10-CM | POA: Diagnosis not present

## 2020-11-22 DIAGNOSIS — F5089 Other specified eating disorder: Secondary | ICD-10-CM

## 2020-11-22 DIAGNOSIS — R112 Nausea with vomiting, unspecified: Secondary | ICD-10-CM | POA: Diagnosis not present

## 2020-11-22 DIAGNOSIS — J029 Acute pharyngitis, unspecified: Secondary | ICD-10-CM

## 2020-11-22 DIAGNOSIS — R109 Unspecified abdominal pain: Secondary | ICD-10-CM | POA: Diagnosis not present

## 2020-11-22 MED ORDER — ONDANSETRON HCL 4 MG PO TABS: 4.0000 mg | ORAL_TABLET | Freq: Three times a day (TID) | ORAL | 0 refills | Status: DC | PRN

## 2020-11-22 NOTE — Progress Notes (Signed)
   SUBJECTIVE:   CHIEF COMPLAINT / HPI:    Beth [name] is a 30 y.o. female here for abdominal pain follow up. Says she has always had GI issues "my whole life."  States recently she is having to go to the bathroom constantly. She has been vomiting after gross things on watching TV and at her job at the [employer] as she reports seeing gross things at work. Reports increased gag reflux. Believes her sx are worse since she has been off her mental health medications.    Pt taking Protonix with some relief. She has been frequently passing gas. She has diarrhea and soft stools. Has had 10-12 stools in the past 24 hours. She has been staying hydrated. Vomiting once yesterday, no blood. Blood in her stool, small amount. History of alcohol use disorder. Last drink a year ago.  Denies dysuria, fever, shortness of breath. Endorses sore throat, rhinorrhea, congestion, body aches.      PERTINENT  PMH / PSH: reviewed and updated as appropriate   OBJECTIVE:   BP 106/76   Pulse (!) 106   Ht 5\' 2"  (1.575 m)   SpO2 98%   BMI 30.48 kg/m    GEN: well appearing female, in no acute distress  HENT: moist mucus membranes, no oral lesions, normal dentition CV: regular rate and rhythm, no murmurs appreciated  RESP: no increased work of breathing, clear to ascultation bilaterally with no crackles, wheezes, or rhonchi  ABD: Bowel sounds present. Soft non-tender, non-distended. No palpable masses.  SKIN: warm, dry PSYCH: anxious, appropriate speech and behavior    ASSESSMENT/PLAN:   Generalized abdominal pain Per history appears chronic. Patient had broad work up about a week ago that was grossly unremarkable. Patient without red flag sx or acute abdomen on exam. Imaging deferred. Suspect IBS vs psychogenic cause. Consider ova and parasite in the future given her occupation. Patient has not had a colonoscopy due to previous hx. Referral to GI placed. . Continue Protonix and this seemed to help her pain. Given  report of congestion, vomiting, sore throat and body aches, COVID testing obtained. Will trial Zofran.        , DO PGY-2, [facility] 11/22/2020

## 2020-11-22 NOTE — Patient Instructions (Signed)
It was great seeing you today!    A referral was placed for the [facility] for your chronic pain. Someone will call you to schedule an appointment. You were tested for COVID. Be sure to quarantine until your test results. Be sure to stay hydrated. Stop by the pharmacy to pick up anti-nausea medications.    If you have questions or concerns please do not hesitate to call at [phone].  Dr. Katherina Right [facility]

## 2020-11-23 LAB — SARS-COV-2, NAA 2 DAY TAT

## 2020-11-23 LAB — NOVEL CORONAVIRUS, NAA: SARS-CoV-2, NAA: NOT DETECTED

## 2020-11-25 NOTE — Assessment & Plan Note (Addendum)
Per history appears chronic. Patient had broad work up about a week ago that was grossly unremarkable. Patient without red flag sx or acute abdomen on exam. Imaging deferred. Suspect IBS vs psychogenic cause. Consider ova and parasite in the future given her occupation. Patient has not had a colonoscopy due to previous hx. Referral to GI placed. . Continue Protonix and this seemed to help her pain. Given report of congestion, vomiting, sore throat and body aches, COVID testing obtained. Will trial Zofran.

## 2020-11-30 ENCOUNTER — Encounter: Payer: Self-pay | Admitting: Physician Assistant

## 2020-12-20 ENCOUNTER — Encounter: Payer: Self-pay | Admitting: Physician Assistant

## 2020-12-20 ENCOUNTER — Ambulatory Visit: Payer: BC Managed Care – PPO | Admitting: Physician Assistant

## 2020-12-20 ENCOUNTER — Other Ambulatory Visit (INDEPENDENT_AMBULATORY_CARE_PROVIDER_SITE_OTHER): Payer: BC Managed Care – PPO

## 2020-12-20 VITALS — BP 110/78 | HR 102 | Ht 62.0 in | Wt 170.0 lb

## 2020-12-20 DIAGNOSIS — R197 Diarrhea, unspecified: Secondary | ICD-10-CM

## 2020-12-20 DIAGNOSIS — R112 Nausea with vomiting, unspecified: Secondary | ICD-10-CM | POA: Diagnosis not present

## 2020-12-20 DIAGNOSIS — G8929 Other chronic pain: Secondary | ICD-10-CM

## 2020-12-20 DIAGNOSIS — R109 Unspecified abdominal pain: Secondary | ICD-10-CM

## 2020-12-20 LAB — SEDIMENTATION RATE: Sed Rate: 43 mm/hr — ABNORMAL HIGH (ref 0–20)

## 2020-12-20 LAB — C-REACTIVE PROTEIN: CRP: 1.1 mg/dL (ref 0.5–20.0)

## 2020-12-20 MED ORDER — GLYCOPYRROLATE 2 MG PO TABS
2.0000 mg | ORAL_TABLET | Freq: Two times a day (BID) | ORAL | 1 refills | Status: DC | PRN
Start: 2020-12-20 — End: 2021-01-17

## 2020-12-20 MED ORDER — ONDANSETRON HCL 4 MG PO TABS: 4.0000 mg | ORAL_TABLET | Freq: Three times a day (TID) | ORAL | 1 refills | Status: DC | PRN

## 2020-12-20 MED ORDER — PANTOPRAZOLE SODIUM 40 MG PO TBEC: 40.0000 mg | DELAYED_RELEASE_TABLET | Freq: Every day | ORAL | 0 refills | Status: DC

## 2020-12-20 NOTE — Progress Notes (Signed)
Subjective:    Patient ID: Beth [name], female    DOB: 07-Feb-1991, 30 y.o.   MRN: [id number]  HPI Beth "Beth [name]" is a 30 year old white female, new to GI today [name] for evaluation of chronic abdominal pain nausea and vomiting and diarrhea. Patient has not had any prior GI evaluation. She says her current symptoms have been present since January 2022 though she has had longer-term ongoing less significant GI symptoms with loose stools and pain. Interestingly she stopped her psychiatric meds in January as she had run out and did not have any insurance for a period of time and was unable to be seen at behavioral health. She has prior history of EtOH abuse, says she has been abstinent over the past 13 months.  She has history of major depression and chronic anxiety, bulimia, PTSD. She had been on a combination of trazodone, Prozac and BuSpar which she said helped quite a bit with her anxiety and issues with chronic insomnia. She says she has been unable to get meds since then as because of her prior issues behavioral health at [facility] is declining to see her  She has not asked primary care to help with refills on meds. She has been using Protonix 40 mg once daily and Zofran 4 mg twice daily which she says seems to be helping some with her stomach and also helps with the nausea and vomiting though she still will intermittently have vomiting.  She has a difficult time describing her abdominal pain but says that it involves her entire abdomen and is just constant.  She generally will have 3-4 bowel movements per day generally somewhat loose, no melena or hematochezia.  She describes an episode about a year ago with some rectal bleeding but that has not recurred.  She typically feels nauseated after eating and has been recently eating very small meals throughout the day and grazing which seems to be helping.  Her weight fluctuates between 160 and 180. She  thinks she may be lactose intolerant, has been vegetarian recently and not consuming much dairy at all.  She does have occasional diet sodas. She works at a [employer] and is concerned about parasitic infections. Family history negative for definite GI disease though grandmother may have had IBS. Most recent labs 11/2020 hemoglobin 13 hematocrit 40 c-Met unremarkable lipase within normal limits HIV and hep C both negative  Review of Systems Pertinent positive and negative review of systems were noted in the above HPI section.  All other review of systems was otherwise negative.  Outpatient Encounter Medications as of 12/20/2020  Medication Sig  . glycopyrrolate (ROBINUL) 2 MG tablet Take 1 tablet (2 mg total) by mouth 2 (two) times daily as needed (abdominal pain/ diarrhea).  Marland Kitchen levonorgestrel (MIRENA) 20 MCG/24HR IUD 1 each by Intrauterine route once.  . [DISCONTINUED] fluticasone (FLONASE) 50 MCG/ACT nasal spray Place 1-2 sprays into both nostrils daily as needed for allergies.  . [DISCONTINUED] folic acid (FOLVITE) 1 MG tablet Take 1 tablet (1 mg total) by mouth daily.  . [DISCONTINUED] ondansetron (ZOFRAN) 4 MG tablet Take 1 tablet (4 mg total) by mouth every 8 (eight) hours as needed for nausea or vomiting.  . [DISCONTINUED] pantoprazole (PROTONIX) 40 MG tablet Take 1 tablet (40 mg total) by mouth daily.  . ondansetron (ZOFRAN) 4 MG tablet Take 1 tablet (4 mg total) by mouth every 8 (eight) hours as needed for nausea or vomiting.  . pantoprazole (PROTONIX)  40 MG tablet Take 1 tablet (40 mg total) by mouth daily.  . [DISCONTINUED] ARIPiprazole, sensor, 5 MG TABS Take 5 mg by mouth at bedtime. (Patient not taking: No sig reported)  . [DISCONTINUED] busPIRone (BUSPAR) 10 MG tablet TAKE 1 TABLET(10 MG) BY MOUTH THREE TIMES DAILY (Patient not taking: No sig reported)  . [DISCONTINUED] FLUoxetine (PROZAC) 40 MG capsule TAKE 1 CAPSULE(40 MG) BY MOUTH DAILY (Patient not taking: No sig reported)  .  [DISCONTINUED] hydrOXYzine (ATARAX/VISTARIL) 50 MG tablet Take 1 tablet (50 mg total) by mouth daily as needed for anxiety. (Patient not taking: Reported on 12/20/2020)  . [DISCONTINUED] propranolol (INDERAL) 10 MG tablet TAKE 1 TABLET(10 MG) BY MOUTH TWICE DAILY (Patient not taking: Reported on 12/20/2020)  . [DISCONTINUED] traZODone (DESYREL) 100 MG tablet TAKE 1 TABLET(100 MG) BY MOUTH AT BEDTIME AS NEEDED FOR SLEEP (Patient not taking: Reported on 12/20/2020)   No facility-administered encounter medications on file as of 12/20/2020.   Allergies  Allergen Reactions  . Benadryl Allergy [Diphenhydramine Hcl] Other (See Comments)    Reports makes her feel nervous and jerky  . Latex Other (See Comments)    irritation   Patient Active Problem List   Diagnosis Date Noted  . Generalized abdominal pain 11/15/2020  . Vaginal discharge 11/15/2020  . Alcohol withdrawal ([place]) 12/02/2019  . Alcohol use disorder, severe, dependence ([place]) 12/02/2019  . Suicidal ideation   . Anxiety 11/10/2019  . Influenza-like symptoms 07/22/2019  . Viral respiratory illness 07/05/2019  . Mild concussion 04/20/2019  . Viral illness 04/20/2019  . MDD (major depressive disorder), recurrent episode, severe ([place]) 12/19/2018  . Dyspepsia 09/02/2018  . Pilonidal cyst without infection 03/04/2018  . Tachycardia 01/11/2018  . Tobacco use disorder 01/11/2018  . Abnormal Pap smear of cervix 10/01/2017  . Post traumatic stress disorder (PTSD) 06/14/2016  . MDD (major depressive disorder), recurrent, severe, with psychosis ([place]) 06/05/2016   Social History   Socioeconomic History  . Marital status: Single    Spouse name: Not on file  . Number of children: 0  . Years of education: 67  . Highest education level: Not on file  Occupational History  . Occupation: Camera operator  Tobacco Use  . Smoking status: Current Some Day Smoker    Years: 5.00    Types: Cigarettes  . Smokeless tobacco: Never Used  . Tobacco comment: Rarely  smokes - 01/11/18  Vaping Use  . Vaping Use: Never used  Substance and Sexual Activity  . Alcohol use: No    Alcohol/week: 14.0 standard drinks    Types: 14 Standard drinks or equivalent per week    Comment:  (09-08-15- patient has stopped drinking all alcohol) ; has not drank in a month  . Drug use: No    Types: Marijuana    Comment: Usesd marijuana last 35 days previously  . Sexual activity: Yes    Partners: Male    Birth control/protection: I.U.D.    Comment: Nuvaring  Other Topics Concern  . Not on file  Social History Narrative   Fun: Play animals, yoga, video games   Social Determinants of Health   Financial Resource Strain: Not on file  Food Insecurity: Not on file  Transportation Needs: Not on file  Physical Activity: Not on file  Stress: Not on file  Social Connections: Not on file  Intimate Partner Violence: Not on file    Ms. Westendorf's family history includes ADD / ADHD in her sister; Alcohol abuse in her father, maternal grandmother,  paternal grandfather, and sister; Anxiety disorder in her father, mother, and sister; Asthma in her sister; Breast cancer in her mother; Cancer in her maternal grandfather and mother; Dementia in her maternal grandmother; Depression in her father and sister; Diabetes in her father; Drug abuse in her father, mother, and paternal uncle; Heart attack in her father; Hypertension in her father and mother; Ovarian cancer in her mother.      Objective:    Vitals:   12/20/20 1518  BP: 110/78  Pulse: (!) 102  SpO2: 97%    Physical Exam Well-developed well-nourished white female in no acute distress.  Height, Weight, 170 BMI 31.09  HEENT; nontraumatic normocephalic, EOMI, PE R LA, sclera anicteric. Oropharynx; not examined today Neck; supple, no JVD Cardiovascular; regular rate and rhythm with S1-S2, no murmur rub or gallop Pulmonary; Clear bilaterally Abdomen; soft, no focal tenderness, no guarding, nondistended, no palpable mass or  hepatosplenomegaly, bowel sounds are active Rectal; not done today Skin; benign exam, no jaundice rash or appreciable lesions Extremities; no clubbing cyanosis or edema skin warm and dry Neuro/Psych; alert and oriented x4, grossly nonfocal mood and affect appropriate       Assessment & Plan:   #71 30 year old white female with chronic abdominal pain and by report chronic loose stools.  Patient has had significant increase in symptoms over the past 5 months since coming off of her chronic psych meds including trazodone Prozac and BuSpar which were for chronic anxiety, insomnia and depression.  She is complaining of ongoing generalized abdominal pain frequent nausea and intermittent vomiting and frequent stools with 3-4 bowel movements per day episodes of diarrhea at least once daily.  No recent melena or hematochezia.  Weight typically fluctuates within a 20 pound range.  I suspect her symptoms are functional she may have underlying IBS now being exacerbated off her multiple psychiatric meds which she had been on long-term. Cannot rule out IBD, probable lactose intolerance, rule out celiac disease, rule out gallbladder disease  #2 chronic anxiety 3.  History of major depression 4.  Prior history of EtOH abuse abstinent x1 year 5.  PTSD 6.  History of bulimia  Plan; TTG/IgA, sed rate, CRP, stool for lactoferrin, stool for O&P CT abdomen pelvis with contrast she is not had any abdominal imaging. Refill Zofran 4 mg every 8 hours as needed for nausea Refill Protonix 40 mg p.o. every morning AC breakfast Trial of glycopyrrolate 2 mg p.o. twice daily for abdominal pain/loose stools Patient asked to avoid lactose and artificial sweeteners I asked her to contact family medicine to see if she can get refills on her psych meds until she can be established with a psychiatric practice Patient will be established care with Dr. Loletha Carrow.  Further recommendations pending results of above.  She may require  colonoscopy/EGD  depending on above results.   Sunnie Odden S Shaina Gullatt PA-C 12/20/2020   Cc: Carollee Leitz, MD

## 2020-12-20 NOTE — Patient Instructions (Addendum)
If you are age 32 or younger, your body mass index should be between 19-25. Your Body mass index is 31.09 kg/m. If this is out of the aformentioned range listed, please consider follow up with your Primary Care Provider.   Your provider has requested that you go to the basement level for lab work before leaving today. Press "B" on the elevator. The lab is located at the first door on the left as you exit the elevator.  You have been scheduled for a CT scan of the abdomen and pelvis at [facility] ([address]---this is in the same building as [employer]).   You are scheduled on 12/24/2020 at 3:00 pm. You should arrive 15 minutes prior to your appointment time for registration. Please follow the written instructions below on the day of your exam:  WARNING: IF YOU ARE ALLERGIC TO IODINE/X-RAY DYE, PLEASE NOTIFY RADIOLOGY IMMEDIATELY AT [phone]! YOU WILL BE GIVEN A 13 HOUR PREMEDICATION PREP.  1) Do not eat or  after 11:00 am (4 hours prior to your test),  2) You have been given 2 bottles of oral contrast to drink. The solution may taste better if refrigerated, but do NOT add ice or any other liquid to this solution. Shake well before drinking.    Drink 1 bottle of contrast @ 1:00 pm (2 hours prior to your exam)  Drink 1 bottle of contrast @ 2:00 pm (1 hour prior to your exam)  You may take any medications as prescribed with a small amount of water, if necessary. If you take any of the following medications: METFORMIN, GLUCOPHAGE, GLUCOVANCE, AVANDAMET, RIOMET, FORTAMET, [place] MET, JANUMET, GLUMETZA or METAGLIP, you MAY be asked to HOLD this medication 48 hours AFTER the exam.  The purpose of you drinking the oral contrast is to aid in the visualization of your intestinal tract. The contrast solution may cause some diarrhea. Depending on your individual set of symptoms, you may also receive an intravenous injection of x-ray contrast/dye. Plan on being at [facility] for 30 minutes or longer, depending on the type of exam you are having performed.  This test typically takes 30-45 minutes to complete.  If you have any questions regarding your exam or if you need to reschedule, you may call the CT department at [phone] between the hours of 8:00 am and 5:00 pm, Monday-Friday.  We have sent in refills on your Zofran and Pantoprazole.  Start Glycopyrrolate 2 mg 1 tablet twice daily as needed for abdominal pain and/or diarrhea  We have scheduled you for a follow up on January 17, 2021 at 3:30 pm.  Thank you for entrusting me with your care and choosing [facility].  Amy Esterwood, PA-C

## 2020-12-21 ENCOUNTER — Other Ambulatory Visit: Payer: BC Managed Care – PPO

## 2020-12-21 DIAGNOSIS — R197 Diarrhea, unspecified: Secondary | ICD-10-CM

## 2020-12-21 DIAGNOSIS — R112 Nausea with vomiting, unspecified: Secondary | ICD-10-CM

## 2020-12-21 DIAGNOSIS — G8929 Other chronic pain: Secondary | ICD-10-CM | POA: Diagnosis not present

## 2020-12-21 DIAGNOSIS — R109 Unspecified abdominal pain: Secondary | ICD-10-CM

## 2020-12-21 LAB — IGA: Immunoglobulin A: 91 mg/dL (ref 47–310)

## 2020-12-21 LAB — TISSUE TRANSGLUTAMINASE, IGA: (tTG) Ab, IgA: <1 U/mL

## 2020-12-22 NOTE — Progress Notes (Signed)
____________________________________________________________  Attending physician addendum:  Thank you for sending this case to me. I have reviewed the entire note and agree with the plan.  As you indicated, while this may be a functional bowel disorder, further testing as ordered necessary to investigate inflammatory or infectious causes.  Endoscopic workup may be needed if above workup unrevealing and treatment insufficiently effective.  Amada Jupiter, MD  ____________________________________________________________

## 2020-12-24 ENCOUNTER — Inpatient Hospital Stay: Admission: RE | Admit: 2020-12-24 | Payer: BC Managed Care – PPO | Source: Ambulatory Visit

## 2020-12-28 LAB — OVA AND PARASITE EXAMINATION
CONCENTRATE RESULT:: NONE SEEN
MICRO NUMBER:: 11871477
SPECIMEN QUALITY:: ADEQUATE
TRICHROME RESULT:: NONE SEEN

## 2020-12-28 LAB — FECAL LACTOFERRIN, QUANT
Fecal Lactoferrin: NEGATIVE
MICRO NUMBER:: 11871613
SPECIMEN QUALITY:: ADEQUATE

## 2020-12-30 ENCOUNTER — Telehealth: Payer: Self-pay

## 2020-12-30 NOTE — Telephone Encounter (Signed)
Patient is returning your call.  

## 2020-12-30 NOTE — Telephone Encounter (Signed)
Left message for patient to please call back. 

## 2020-12-30 NOTE — Telephone Encounter (Signed)
See patient advice note

## 2020-12-30 NOTE — Telephone Encounter (Signed)
Called patient back and left voice mail to please call back again

## 2021-01-04 ENCOUNTER — Ambulatory Visit (AMBULATORY_SURGERY_CENTER): Payer: Self-pay

## 2021-01-04 ENCOUNTER — Other Ambulatory Visit: Payer: Self-pay

## 2021-01-04 VITALS — Ht 62.0 in | Wt 172.0 lb

## 2021-01-04 DIAGNOSIS — R197 Diarrhea, unspecified: Secondary | ICD-10-CM

## 2021-01-04 DIAGNOSIS — G8929 Other chronic pain: Secondary | ICD-10-CM

## 2021-01-04 DIAGNOSIS — R109 Unspecified abdominal pain: Secondary | ICD-10-CM

## 2021-01-04 NOTE — Progress Notes (Signed)
No allergies to soy or egg Pt is not on blood thinners or diet pills Denies issues with sedation/intubation Denies atrial flutter/fib Denies constipation   Emmi instructions given to pt  Pt is aware of Covid safety and care partner requirements.  

## 2021-01-17 ENCOUNTER — Ambulatory Visit: Payer: BC Managed Care – PPO | Admitting: Physician Assistant

## 2021-01-17 ENCOUNTER — Encounter: Payer: Self-pay | Admitting: Physician Assistant

## 2021-01-17 VITALS — BP 112/70 | HR 116 | Ht 62.0 in | Wt 167.4 lb

## 2021-01-17 DIAGNOSIS — K219 Gastro-esophageal reflux disease without esophagitis: Secondary | ICD-10-CM | POA: Diagnosis not present

## 2021-01-17 DIAGNOSIS — R109 Unspecified abdominal pain: Secondary | ICD-10-CM | POA: Diagnosis not present

## 2021-01-17 DIAGNOSIS — R197 Diarrhea, unspecified: Secondary | ICD-10-CM | POA: Diagnosis not present

## 2021-01-17 MED ORDER — BUSPIRONE HCL 10 MG PO TABS: 10.0000 mg | ORAL_TABLET | Freq: Two times a day (BID) | ORAL | 1 refills | Status: DC

## 2021-01-17 MED ORDER — GLYCOPYRROLATE 2 MG PO TABS: 2.0000 mg | ORAL_TABLET | Freq: Two times a day (BID) | ORAL | 1 refills | Status: DC | PRN

## 2021-01-17 MED ORDER — FLUOXETINE HCL 20 MG PO CAPS: 20.0000 mg | ORAL_CAPSULE | Freq: Every morning | ORAL | 1 refills | Status: DC

## 2021-01-17 MED ORDER — PANTOPRAZOLE SODIUM 40 MG PO TBEC: 40.0000 mg | DELAYED_RELEASE_TABLET | Freq: Every day | ORAL | 0 refills | Status: DC

## 2021-01-17 MED ORDER — TRAZODONE HCL 50 MG PO TABS: 50.0000 mg | ORAL_TABLET | Freq: Every day | ORAL | 1 refills | Status: DC

## 2021-01-17 NOTE — Progress Notes (Signed)
Subjective:    Patient ID: Beth [name], female    DOB: 07-14-1991, 30 y.o.   MRN: [id number]  HPI Beth [name] is a 30 year old female, recently established and seen on 12/20/2020 by myself, who comes back in today for follow-up.  She has been having GI symptoms since January 2022 which started around the same time that she stopped all of her psychiatric meds as she had run out and was unable to be seen to get refills.  Patient has history of EtOH abuse but says she has been abstinent now over the past 14 months, also with history of major depression, chronic anxiety, bulimia and PTSD.  She had been on a combination of trazodone, Prozac and BuSpar which she said helped quite a bit with anxiety and also with insomnia. She has been having problems with nausea and intermittent vomiting.  Says she may vomit a couple of times per week now and she definitely feels that this is exacerbated by anxiety. Her bowel movements have been alternating, on a bad day she says she might have up to 10 bowel movements in a day which are loose and occasionally urgent to the point of incontinence, other days she may have normal bowel movements.  She does think she has more issues with the looser more frequent stools later in her work week as her anxiety increases. After her last visit she had celiac markers done which were negative, stool for lactoferrin was normal, stool for O&P negative CRP within normal limits/sed rate 43 She was started on Protonix 40 mg p.o. every morning for the nausea and also for abdominal pain and dyspepsia.  She says she has been taking that and does feel that it helps.  She has also been using glycopyrrolate 2 mg twice daily which she thinks helps but does not alleviate her bowel symptoms. She had called back after labs had been done etc. with persistent symptoms and we decided to proceed with EGD and colonoscopy which are scheduled with Dr. Myrtie Neither for next week.  She was initially supposed to have a CT  scan but that has been rescheduled out into July in part due to the contrast shortage.  She has not asked her primary care physician again about getting her started back on her regimen of psychiatric meds though had been told by them that she needed to be seen by psychiatry to be prescribed these medications.  Patient says that she has been declined by behavioral health to be seen, because she has missed too many appointments in the past.  She had tried with another group in [facility] but was also unsuccessful with following up with them.  Review of Systems Pertinent positive and negative review of systems were noted in the above HPI section.  All other review of systems was otherwise negative.  Outpatient Encounter Medications as of 01/17/2021  Medication Sig  . busPIRone (BUSPAR) 10 MG tablet Take 1 tablet (10 mg total) by mouth 2 (two) times daily.  . Cannabinoids (THC FREE PO) Take by mouth.  Marland Kitchen FLUoxetine (PROZAC) 20 MG capsule Take 1 capsule (20 mg total) by mouth in the morning.  Marland Kitchen levonorgestrel (MIRENA) 20 MCG/24HR IUD 1 each by Intrauterine route once.  . ondansetron (ZOFRAN) 4 MG tablet Take 1 tablet (4 mg total) by mouth every 8 (eight) hours as needed for nausea or vomiting.  . traZODone (DESYREL) 50 MG tablet Take 1 tablet (50 mg total) by mouth at bedtime.  . [DISCONTINUED] glycopyrrolate (ROBINUL)  2 MG tablet Take 1 tablet (2 mg total) by mouth 2 (two) times daily as needed (abdominal pain/ diarrhea).  . [DISCONTINUED] pantoprazole (PROTONIX) 40 MG tablet Take 1 tablet (40 mg total) by mouth daily.  Marland Kitchen glycopyrrolate (ROBINUL) 2 MG tablet Take 1 tablet (2 mg total) by mouth 2 (two) times daily as needed (abdominal pain/ diarrhea).  . pantoprazole (PROTONIX) 40 MG tablet Take 1 tablet (40 mg total) by mouth daily.   No facility-administered encounter medications on file as of 01/17/2021.   Allergies  Allergen Reactions  . Benadryl Allergy [Diphenhydramine Hcl] Other (See Comments)     Reports makes her feel nervous and jerky  . Latex Other (See Comments)    irritation   Patient Active Problem List   Diagnosis Date Noted  . Generalized abdominal pain 11/15/2020  . Vaginal discharge 11/15/2020  . Alcohol withdrawal ([facility]) 12/02/2019  . Alcohol use disorder, severe, dependence ([facility]) 12/02/2019  . Suicidal ideation   . Anxiety 11/10/2019  . Influenza-like symptoms 07/22/2019  . Viral respiratory illness 07/05/2019  . Mild concussion 04/20/2019  . Viral illness 04/20/2019  . MDD (major depressive disorder), recurrent episode, severe ([facility]) 12/19/2018  . Dyspepsia 09/02/2018  . Pilonidal cyst without infection 03/04/2018  . Tachycardia 01/11/2018  . Tobacco use disorder 01/11/2018  . Abnormal Pap smear of cervix 10/01/2017  . Post traumatic stress disorder (PTSD) 06/14/2016  . MDD (major depressive disorder), recurrent, severe, with psychosis ([facility]) 06/05/2016   Social History   Socioeconomic History  . Marital status: Single    Spouse name: Not on file  . Number of children: 0  . Years of education: 11  . Highest education level: Not on file  Occupational History  . Occupation: Museum/gallery conservator  Tobacco Use  . Smoking status: Current Some Day Smoker    Years: 5.00    Types: Cigarettes  . Smokeless tobacco: Never Used  Vaping Use  . Vaping Use: Former  Substance and Sexual Activity  . Alcohol use: No  . Drug use: Yes    Frequency: 7.0 times per week    Types: Marijuana  . Sexual activity: Yes    Partners: Male    Birth control/protection: I.U.D.    Comment: Nuvaring  Other Topics Concern  . Not on file  Social History Narrative   Fun: Play animals, yoga, video games   Social Determinants of Health   Financial Resource Strain: Not on file  Food Insecurity: Not on file  Transportation Needs: Not on file  Physical Activity: Not on file  Stress: Not on file  Social Connections: Not on file  Intimate Partner Violence: Not on file    Ms. Prindiville's  family history includes ADD / ADHD in her sister; Alcohol abuse in her father, maternal grandmother, paternal grandfather, and sister; Anxiety disorder in her father, mother, and sister; Asthma in her sister; Breast cancer in her mother; Cancer in her maternal grandfather and mother; Colon polyps in her father; Dementia in her maternal grandmother; Depression in her father and sister; Diabetes in her father; Drug abuse in her father, mother, and paternal uncle; Heart attack in her father; Hypertension in her father and mother; Ovarian cancer in her mother.      Objective:    Vitals:   01/17/21 1519  BP: 112/70  Pulse: (!) 116  SpO2: 98%    Physical Exam Well-developed well-nourished female in no acute distress.  Height, Weight,167BMI 30.6  HEENT; nontraumatic normocephalic, EOMI, PE R LA, sclera anicteric.  Oropharynx; not done Neck; supple, no JVD Cardiovascular; regular rate and rhythm with S1-S2, no murmur rub or gallop Pulmonary; Clear bilaterally Abdomen; soft, no focal tenderness nondistended, no palpable mass or hepatosplenomegaly, bowel sounds are active Rectal; not done today Skin; benign exam, no jaundice rash or appreciable lesions Extremities; no clubbing cyanosis or edema skin warm and dry Neuro/Psych; alert and oriented x4, grossly nonfocal mood and affect appropriate but flat       Assessment & Plan:   #58 30 year old female with dyspepsia, nausea, intermittent vomiting, and 46-month history of abdominal discomfort and frequent loose stools. Initial work-up has been unrevealing She is scheduled for upcoming EGD and colonoscopy with Dr. Myrtie Neither I suspect that her symptoms are secondary to functional bowel disease being exacerbated by underlying chronic anxiety and depression  Will rule out IBD  #2 history of EtOH abuse patient reports abstinence over the past 12 to 13 months 3.  PTSD 4.  History of bulimia 5.  Chronic anxiety and history of major depression #6  chronic insomnia  Plan; continue Protonix 40 mg p.o. every morning AC breakfast Continue glycopyrrolate 2 mg p.o. twice daily Await results of upcoming colonoscopy and EGD.  If these are unrevealing we may be able to cancel the CT scan which had been scheduled out into July.  I will go ahead and restart patient on BuSpar 5 mg p.o. twice daily, as well as Prozac 20 mg p.o. every morning and trazodone 100 mg nightly. She had previously been on BuSpar 10 mg 3 times daily, Prozac 40 mg daily and trazodone 100 nightly. I told her I would prescribe these very short-term for a month or 2 until she was able to get in with primary care for follow-up and prescriptions and have also asked her to reach out to other psychiatric practices in [place] and get herself an appointment to reestablish with psychiatry for ongoing follow-up. Dartanyon Frankowski S Kippy Melena PA-C 01/17/2021   Cc: Dana Allan, MD

## 2021-01-17 NOTE — Patient Instructions (Addendum)
If you are age 32 or younger, your body mass index should be between 19-25. Your Body mass index is 30.62 kg/m. If this is out of the aformentioned range listed, please consider follow up with your Primary Care Provider.  __________________________________________________________  The [facility] providers would like to encourage you to use [facility] to communicate with providers for non-urgent requests or questions.  Due to long hold times on the telephone, sending your provider a message by [facility] may be a faster and more efficient way to get a response.  Please allow 48 business hours for a response.  Please remember that this is for non-urgent requests.   Refills of Glycopyrrolate and Pantoprazole have been sent to your Pharmacy.  Restart the following medications, they has been sent to your pharmacy Trazadone 50 mg 1 tablet at bedtime Fluoxetine 20 mg 1 capsule every morning Buspar 10 mg 1 tablet twice daily  Please try to make and appointment with your psychiatrist prior to running out of your mediations.  Keep your appointments for your Colonoscopy/Endoscopy as well as your CT.  Thank you for entrusting me with your care and choosing [facility].  Amy Esterwood, PA-C

## 2021-01-17 NOTE — Progress Notes (Signed)
____________________________________________________________  Attending physician addendum:  Thank you for sending this case to me. I have reviewed the entire note and agree with the plan.  However, this patient is on Dr. Frankey Shown [facility] schedule for a colonoscopy only (no EGD) on 01/25/21. Perhaps [facility] had earlier availability, but there seems to be a scheduling miscommunication with no EGD. Please investigate.  Amada Jupiter, MD  ____________________________________________________________

## 2021-01-24 ENCOUNTER — Encounter: Payer: Self-pay | Admitting: Gastroenterology

## 2021-01-24 ENCOUNTER — Encounter: Payer: Self-pay | Admitting: Certified Registered Nurse Anesthetist

## 2021-01-25 ENCOUNTER — Other Ambulatory Visit (INDEPENDENT_AMBULATORY_CARE_PROVIDER_SITE_OTHER): Payer: BC Managed Care – PPO

## 2021-01-25 ENCOUNTER — Ambulatory Visit (AMBULATORY_SURGERY_CENTER): Payer: BC Managed Care – PPO | Admitting: Gastroenterology

## 2021-01-25 ENCOUNTER — Other Ambulatory Visit: Payer: Self-pay

## 2021-01-25 ENCOUNTER — Encounter: Payer: Self-pay | Admitting: Gastroenterology

## 2021-01-25 VITALS — BP 100/64 | HR 100 | Temp 98.6°F | Resp 18 | Ht 62.0 in | Wt 172.0 lb

## 2021-01-25 DIAGNOSIS — R112 Nausea with vomiting, unspecified: Secondary | ICD-10-CM | POA: Diagnosis not present

## 2021-01-25 DIAGNOSIS — K219 Gastro-esophageal reflux disease without esophagitis: Secondary | ICD-10-CM

## 2021-01-25 DIAGNOSIS — R194 Change in bowel habit: Secondary | ICD-10-CM | POA: Diagnosis not present

## 2021-01-25 DIAGNOSIS — R109 Unspecified abdominal pain: Secondary | ICD-10-CM | POA: Diagnosis not present

## 2021-01-25 DIAGNOSIS — R1084 Generalized abdominal pain: Secondary | ICD-10-CM | POA: Diagnosis not present

## 2021-01-25 DIAGNOSIS — R197 Diarrhea, unspecified: Secondary | ICD-10-CM

## 2021-01-25 LAB — HCG, QUANTITATIVE, PREGNANCY: Quantitative HCG: <0.6 m[IU]/mL

## 2021-01-25 MED ORDER — SODIUM CHLORIDE 0.9 % IV SOLN: 500.0000 mL | Freq: Once | INTRAVENOUS | Status: DC

## 2021-01-25 NOTE — Progress Notes (Signed)
1530 Robinul 0.1 mg IV given due large amount of secretions upon assessment.  MD made aware, vss  

## 2021-01-25 NOTE — Op Note (Signed)
[facility] Patient Name: Beth [name] Procedure Date: 01/25/2021 1:29 PM MRN: [id number] Endoscopist: Doristine Locks , MD Age: 30 Referring MD:  Date of Birth: 1991-03-14 Gender: Female Account #: [id number] Procedure:                Upper GI endoscopy Indications:              Generalized abdominal pain, Diarrhea/Change in                            bowel habits, Nausea with vomiting Medicines:                Monitored Anesthesia Care Procedure:                Pre-Anesthesia Assessment:                           - Prior to the procedure, a History and Physical                            was performed, and patient medications and                            allergies were reviewed. The patient's tolerance of                            previous anesthesia was also reviewed. The risks                            and benefits of the procedure and the sedation                            options and risks were discussed with the patient.                            All questions were answered, and informed consent                            was obtained. Prior Anticoagulants: The patient has                            taken no previous anticoagulant or antiplatelet                            agents. ASA Grade Assessment: II - A patient with                            mild systemic disease. After reviewing the risks                            and benefits, the patient was deemed in                            satisfactory condition to undergo the procedure.  After obtaining informed consent, the endoscope was                            passed under direct vision. Throughout the                            procedure, the patient's blood pressure, pulse, and                            oxygen saturations were monitored continuously. The                            Endoscope was introduced through the mouth, and                            advanced to the second  part of duodenum. The upper                            GI endoscopy was accomplished without difficulty.                            The patient tolerated the procedure well. Scope In: Scope Out: Findings:                 The examined esophagus was normal.                           The entire examined stomach was normal. Biopsies                            were taken with a cold forceps for Helicobacter                            pylori testing. Estimated blood loss was minimal.                           The examined duodenum was normal. Biopsies were                            taken with a cold forceps for histology. Estimated                            blood loss was minimal. Complications:            No immediate complications. Estimated Blood Loss:     Estimated blood loss was minimal. Impression:               - Normal esophagus.                           - Normal stomach. Biopsied.                           - Normal examined duodenum. Biopsied. Recommendation:           - Patient has a contact number available for  emergencies. The signs and symptoms of potential                            delayed complications were discussed with the                            patient. Return to normal activities tomorrow.                            Written discharge instructions were provided to the                            patient.                           - Resume previous diet.                           - Continue present medications.                           - Await pathology results.                           - Colonoscopy today.                           - Return to GI clinic PRN. Doristine Locks, MD 01/25/2021 3:57:28 PM

## 2021-01-25 NOTE — Patient Instructions (Signed)
Resume previous diet Continue current medications Await pathology results Use supplemental fiber ie: metamucil, konsyl, fibercon, citrucel  YOU HAD AN ENDOSCOPIC PROCEDURE TODAY AT THE [facility]:   Refer to the procedure report that was given to you for any specific questions about what was found during the examination.  If the procedure report does not answer your questions, please call your gastroenterologist to clarify.  If you requested that your care partner not be given the details of your procedure findings, then the procedure report has been included in a sealed envelope for you to review at your convenience later.  YOU SHOULD EXPECT: Some feelings of bloating in the abdomen. Passage of more gas than usual.  Walking can help get rid of the air that was put into your GI tract during the procedure and reduce the bloating. If you had a lower endoscopy (such as a colonoscopy or flexible sigmoidoscopy) you may notice spotting of blood in your stool or on the toilet paper. If you underwent a bowel prep for your procedure, you may not have a normal bowel movement for a few days.  Please Note:  You might notice some irritation and congestion in your nose or some drainage.  This is from the oxygen used during your procedure.  There is no need for concern and it should clear up in a day or so.  SYMPTOMS TO REPORT IMMEDIATELY:  Following lower endoscopy (colonoscopy or flexible sigmoidoscopy):  Excessive amounts of blood in the stool  Significant tenderness or worsening of abdominal pains  Swelling of the abdomen that is new, acute  Fever of 100F or higher  Following upper endoscopy (EGD)  Vomiting of blood or coffee ground material  New chest pain or pain under the shoulder blades  Painful or persistently difficult swallowing  New shortness of breath  Fever of 100F or higher  Black, tarry-looking stools  For urgent or emergent issues, a gastroenterologist can be reached at  any hour by calling [phone]. Do not use MyChart messaging for urgent concerns.   DIET:  We do recommend a small meal at first, but then you may proceed to your regular diet.  Drink plenty of fluids but you should avoid alcoholic beverages for 24 hours.  ACTIVITY:  You should plan to take it easy for the rest of today and you should NOT DRIVE or use heavy machinery until tomorrow (because of the sedation medicines used during the test).    FOLLOW UP: Our staff will call the number listed on your records 48-72 hours following your procedure to check on you and address any questions or concerns that you may have regarding the information given to you following your procedure. If we do not reach you, we will leave a message.  We will attempt to reach you two times.  During this call, we will ask if you have developed any symptoms of COVID 19. If you develop any symptoms (ie: fever, flu-like symptoms, shortness of breath, cough etc.) before then, please call [phone].  If you test positive for Covid 19 in the 2 weeks post procedure, please call and report this information to [place].    If any biopsies were taken you will be contacted by phone or by letter within the next 1-3 weeks.  Please call us at [phone] if you have not heard about the biopsies in 3 weeks.   SIGNATURES/CONFIDENTIALITY: You and/or your care partner have signed paperwork which will be entered into your electronic medical  record.  These signatures attest to the fact that that the information above on your After Visit Summary has been reviewed and is understood.  Full responsibility of the confidentiality of this discharge information lies with you and/or your care-partner.

## 2021-01-25 NOTE — Progress Notes (Signed)
Plan for STAT hCG prior to procedure.

## 2021-01-25 NOTE — Progress Notes (Signed)
Report given to PACU, vss 

## 2021-01-25 NOTE — Op Note (Signed)
[facility] Patient Name: Beth [name] Procedure Date: 01/25/2021 2:49 PM MRN: [id number] Endoscopist: Doristine Locks , MD Age: 30 Referring MD:  Date of Birth: 07/07/1991 Gender: Female Account #: [id number] Procedure:                Colonoscopy Indications:              Generalized abdominal pain, Change in bowel habits,                            Diarrhea Medicines:                Monitored Anesthesia Care Procedure:                Pre-Anesthesia Assessment:                           - Prior to the procedure, a History and Physical                            was performed, and patient medications and                            allergies were reviewed. The patient's tolerance of                            previous anesthesia was also reviewed. The risks                            and benefits of the procedure and the sedation                            options and risks were discussed with the patient.                            All questions were answered, and informed consent                            was obtained. Prior Anticoagulants: The patient has                            taken no previous anticoagulant or antiplatelet                            agents. ASA Grade Assessment: II - A patient with                            mild systemic disease. After reviewing the risks                            and benefits, the patient was deemed in                            satisfactory condition to undergo the procedure.                           -  Prior to the procedure, a History and Physical                            was performed, and patient medications and                            allergies were reviewed. The patient's tolerance of                            previous anesthesia was also reviewed. The risks                            and benefits of the procedure and the sedation                            options and risks were discussed with the patient.                             All questions were answered, and informed consent                            was obtained. Prior Anticoagulants: The patient has                            taken no previous anticoagulant or antiplatelet                            agents. ASA Grade Assessment: II - A patient with                            mild systemic disease. After reviewing the risks                            and benefits, the patient was deemed in                            satisfactory condition to undergo the procedure.                           After obtaining informed consent, the colonoscope                            was passed under direct vision. Throughout the                            procedure, the patient's blood pressure, pulse, and                            oxygen saturations were monitored continuously. The                            Olympus CF-HQ190L (Serial# [id number]) Colonoscope was  introduced through the anus and advanced to the the                            terminal ileum. The colonoscopy was performed                            without difficulty. The patient tolerated the                            procedure well. The quality of the bowel                            preparation was adequate. The terminal ileum,                            ileocecal valve, appendiceal orifice, and rectum                            were photographed. Scope In: 3:41:17 PM Scope Out: 3:53:07 PM Scope Withdrawal Time: 0 hours 9 minutes 24 seconds  Total Procedure Duration: 0 hours 11 minutes 50 seconds  Findings:                 The perianal and digital rectal examinations were                            normal.                           A moderate amount of solid food debris (seeds) was                            found scattered throughout the colon. Some of these                            pockets could not be fully cleared and resulted in                             clogging of the colonoscope several times. Cannot                            rule out the presence of small or flat polyps in                            these areas, but overall, with lavage using copious                            irrigation, the prep was considered adequate for                            the purposes of this study.                           The visualized mucosa was otherwise normal  appearing throughout the colon. No areas of mucosal                            erythema, edema, erosions, or ulceration noted.                            Biopsies for histology were taken with a cold                            forceps from the right colon and left colon for                            evaluation of microscopic colitis. Estimated blood                            loss was minimal.                           Retroflexion in the rectum was not performed due to                            anatomy (narrowed rectal vault). The rectum and                            anal verge were otherwise normal on anterograde                            views and slow colonoscope withdrawal.                           The terminal ileum appeared normal. Complications:            No immediate complications. Estimated Blood Loss:     Estimated blood loss was minimal. Impression:               - Scattered areas of solid food debris with some                            areas of decreased visualization, but overall able                            to visualize the majority of the colon.                           - Normal mucosa in the entire examined colon.                            Biopsied.                           - The examined portion of the ileum was normal. Recommendation:           - Patient has a contact number available for  emergencies. The signs and symptoms of potential                            delayed complications were discussed with the                             patient. Return to normal activities tomorrow.                            Written discharge instructions were provided to the                            patient.                           - Resume previous diet.                           - Continue present medications.                           - Await pathology results.                           - Repeat colonoscopy at age 2 for screening                            purposes.                           - Return to GI office PRN.                           - Use fiber, for example Citrucel, Fibercon, Konsyl                            or Metamucil. Doristine Locks, MD 01/25/2021 4:04:21 PM

## 2021-01-25 NOTE — Progress Notes (Signed)
VS by CW  I have reviewed the patient's medical history in detail and updated the computerized patient record.  

## 2021-01-27 ENCOUNTER — Telehealth: Payer: Self-pay | Admitting: *Deleted

## 2021-01-27 NOTE — Telephone Encounter (Signed)
  Follow up Call-  Call back number 01/25/2021  Post procedure Call Back phone  # [phone]  Permission to leave phone message Yes  Some recent data might be hidden     Patient questions:  Do you have a fever, pain , or abdominal swelling? Yes fever 101.5 at 4am Pain Score  0 *  Have you tolerated food without any problems? Yes.    Have you been able to return to your normal activities? No.  Do you have any questions about your discharge instructions: Diet   No. Medications  No. Follow up visit  No.  Do you have questions or concerns about your Care? Yes.    Actions: * If pain score is 4 or above: Physician/ provider Notified : Vito Cirigliano, DO.  Patient states that she has had a fever for over 24 hours.  States body aches and vomiting.  Fever 1201.5 at 4 am.  States that co-workers have the same symptoms.  She did not call our office. Patient is taking tylenol to get the fever under control.  States she needs it every 4-6 hours. Patient will call us if symptoms worsen.

## 2021-02-17 ENCOUNTER — Telehealth: Payer: Self-pay | Admitting: Physician Assistant

## 2021-02-17 NOTE — Telephone Encounter (Signed)
Patient called to let us know she still doe snot have the contrast for the CT on 02/21/21.

## 2021-02-17 NOTE — Telephone Encounter (Signed)
Called patient, she no longer had her oral contrast because she threw it out. I let her know I would leave her some oral contrast at the front desk. She advised she would pick it up tomorrow.

## 2021-02-21 ENCOUNTER — Other Ambulatory Visit: Payer: Self-pay

## 2021-02-21 ENCOUNTER — Ambulatory Visit (INDEPENDENT_AMBULATORY_CARE_PROVIDER_SITE_OTHER)
Admission: RE | Admit: 2021-02-21 | Discharge: 2021-02-21 | Disposition: A | Discharge status: Home / Self Care | Payer: BC Managed Care – PPO | Source: Ambulatory Visit | Attending: Physician Assistant | Admitting: Physician Assistant

## 2021-02-21 DIAGNOSIS — R109 Unspecified abdominal pain: Secondary | ICD-10-CM | POA: Diagnosis not present

## 2021-02-21 DIAGNOSIS — R1110 Vomiting, unspecified: Secondary | ICD-10-CM | POA: Diagnosis not present

## 2021-02-21 DIAGNOSIS — R112 Nausea with vomiting, unspecified: Secondary | ICD-10-CM

## 2021-02-21 DIAGNOSIS — G8929 Other chronic pain: Secondary | ICD-10-CM

## 2021-02-21 DIAGNOSIS — R197 Diarrhea, unspecified: Secondary | ICD-10-CM

## 2021-02-21 MED ORDER — IOHEXOL 300 MG/ML  SOLN
100.0000 mL | Freq: Once | INTRAMUSCULAR | Status: AC | PRN
  Administered 2021-02-21: 100 mL via INTRAVENOUS

## 2021-02-28 ENCOUNTER — Other Ambulatory Visit: Payer: Self-pay | Admitting: Physician Assistant

## 2021-03-07 ENCOUNTER — Encounter: Payer: Self-pay | Admitting: Family Medicine

## 2021-03-07 ENCOUNTER — Ambulatory Visit (INDEPENDENT_AMBULATORY_CARE_PROVIDER_SITE_OTHER): Payer: BC Managed Care – PPO | Admitting: Family Medicine

## 2021-03-07 ENCOUNTER — Other Ambulatory Visit: Payer: Self-pay

## 2021-03-07 VITALS — BP 100/69 | HR 63 | Ht 62.0 in | Wt 166.2 lb

## 2021-03-07 DIAGNOSIS — F5101 Primary insomnia: Secondary | ICD-10-CM | POA: Diagnosis not present

## 2021-03-07 DIAGNOSIS — F419 Anxiety disorder, unspecified: Secondary | ICD-10-CM

## 2021-03-07 DIAGNOSIS — G4700 Insomnia, unspecified: Secondary | ICD-10-CM | POA: Insufficient documentation

## 2021-03-07 MED ORDER — TRAZODONE HCL 50 MG PO TABS: 50.0000 mg | ORAL_TABLET | Freq: Every day | ORAL | 1 refills | Status: DC

## 2021-03-07 MED ORDER — FLUOXETINE HCL 20 MG PO CAPS: 20.0000 mg | ORAL_CAPSULE | Freq: Every morning | ORAL | 1 refills | Status: DC

## 2021-03-07 MED ORDER — BUSPIRONE HCL 10 MG PO TABS: 10.0000 mg | ORAL_TABLET | Freq: Two times a day (BID) | ORAL | 1 refills | Status: DC

## 2021-03-07 NOTE — Assessment & Plan Note (Signed)
-  PHQ-9 score of 17 with 1 for question 9, reassuringly not suicidal at this time -suicide hotline provided, list of psychiatrist provided and CCM referral placed for assistance with finding psychiatry needs in [place] -refills provided for buspar and prozac -scheduled f/u appointment with PCP, Dr. Clent Ridges, 8/30 for mood check, may have to adjust meds as needed

## 2021-03-07 NOTE — Assessment & Plan Note (Signed)
-  improved -refills provided on trazadone -f/u 8/30, consider increasing as needed

## 2021-03-07 NOTE — Progress Notes (Signed)
    SUBJECTIVE:   CHIEF COMPLAINT / HPI:   Anxiety Recently went to see GI outpatient who performed EGD and colonoscopy for concern for functional bowel disease. There they also restarted psych meds and was told to follow up with PCP prior to further refills which is why patient presents to the clinic today. Compliant on buspar and prozac daily, tolerating medication well. Denies current SI without plan. Feels that she has intermittent SI for most of her life. Has had history of multiple attempts in the past although has not attempted in over a year, states that her attempts have to do with her substance abuse and when she is sober she does well which she has remained to do for over a year now. Wanted to see a psychiatrist but put this on hold as she was taking care of her GI issues and now wants to focus on her anxiety. Support system includes her partner and friends. Planning on moving to [facility] soon and would preferably like to see a psychiatrist there although she will continue to be seen at [facility] for her primary care needs. Has been sleeping better with trazadone, sleeps about 6 hours a night. Compliant on trazadone daily.    OBJECTIVE:   BP 100/69   Pulse 63   Ht 5\' 2"  (1.575 m)   Wt 166 lb 3.2 oz (75.4 kg)   SpO2 98%   BMI 30.40 kg/m   General: Patient well-appearing, in no acute distress. CV: RRR, no murmurs or gallops auscultated Resp: CTAB Psych: mood appropriate, denies current SI or plan  ASSESSMENT/PLAN:   Anxiety -PHQ-9 score of 17 with 1 for question 9, reassuringly not suicidal at this time -suicide hotline provided, list of psychiatrist provided and CCM referral placed for assistance with finding psychiatry needs in -refills provided for buspar and prozac -scheduled f/u appointment with PCP, Dr. [place], 8/30 for mood check, may have to adjust meds as needed  Insomnia -improved -refills provided on trazadone -f/u 8/30, consider increasing as needed       9/30, DO [facility]

## 2021-03-07 NOTE — Patient Instructions (Signed)
It was great seeing you today!  Today we discussed your mood. I have provided refills for your trazadone, buspar and prozac at your desired pharmacy. Below are some resources for a psychiatrist. Please pick one at your convenience to establish care with.   Please follow up at your next scheduled appointment in August with your PCP (Dr. Clent Ridges), if anything arises between now and then, please don't hesitate to contact our office.   Thank you for allowing [place] to be a part of your medical care!  Thank you, Dr. Robyne Peers   Psychiatry Resource List (Adults and Children) Most of these providers will take Medicaid. please consult your insurance for a complete and updated list of available providers. When calling to make an appointment have your insurance information available to confirm you are covered.   [facility]iatry and Counseling [address] [place], [place] [address] [phone]  [facility]  [address] [place], [place] [phone] Crisis [phone]   [facility]:   [facility]: [address]     [phone]   [facility]: [address] [place]. [place],        [phone] [place]: [address] [phone],    [phone] [place]: [phone] [address],                   [phone] Children: [facility] [address]         [phone]  [facility] (virtual only) [phone]    [facility]  (Psychiatry only; Adults /children 12 and over, will take Medicaid)  [address] Laurell Josephs [address], [place], [place] [address]       [phone]   [facility] (Psychiatry & counseling ; adults & children ; will take Medicaid [address]  [place] [address]  Go on-line to complete referral ( https://www.[email or web address].org/en/make-[email or web address]-referral 8317185535    (Spanish speaking therapists)  [facility]  Psychiatry & counseling; Adults and children;  Call Registration prior to scheduling an appointment [phone] [address]    [place], [place] [address]    [phone]  [facility] (Psychiatry & counseling; adults & children; Medicare no Medicaid)  [address]   [place], [place]  [address]      [phone]    [facility] (up to age 21)  Psychiatry & counseling ,will take Medicaid, must do counseling to receive psychiatry services  [address] [place] [address]        [phone]  [facility] (Psychiatry & counseling; adults & children; will take Medicaid) Will need a referral from provider [address],  [place], [place]  [phone]   [facility] --- Walk-In Mon-Friday 8am-3pm ( will take Medicaid, Psychiatry, Adults & children,  [address], [place], [place]   [phone]   [facility] [place]--, Walk-in M-F 8am-12pm and 1pm -3pm   (Counseling, Psychiatry, will take Medicaid, adults & children)  [address] [place], [place]  [phone]       If you are feeling suicidal or depression symptoms worsen please immediately go to:   If you are thinking about harming yourself or having thoughts of suicide, or if you know someone who is, seek help right away. If you are in crisis, make sure you are not left alone.  If someone  else is in crisis, make sure he/she/they is not left alone  Call 988 OR 1-800-273-TALK  24 Hour Availability for Walk-IN services  [facility]  [address] [place], [place] [phone] Crisis [phone]    Other crisis resources:  [facility] [employer] (Domestic Violence, Rape & Victim Assistance [phone]  [facility] [employer] [facility]    (ONLY from 8am-4pm)    [phone]  [facility] Mobile Crisis Unit (24/7)   [phone]  [place] National  Suicide Hotline   [phone] Len Childs)

## 2021-04-12 ENCOUNTER — Encounter: Payer: Self-pay | Admitting: Family Medicine

## 2021-04-12 ENCOUNTER — Other Ambulatory Visit: Payer: Self-pay

## 2021-04-12 ENCOUNTER — Ambulatory Visit (INDEPENDENT_AMBULATORY_CARE_PROVIDER_SITE_OTHER): Payer: BC Managed Care – PPO | Admitting: Family Medicine

## 2021-04-12 VITALS — BP 96/62 | HR 82 | Ht 62.0 in | Wt 168.0 lb

## 2021-04-12 DIAGNOSIS — B349 Viral infection, unspecified: Secondary | ICD-10-CM

## 2021-04-12 DIAGNOSIS — F39 Unspecified mood [affective] disorder: Secondary | ICD-10-CM

## 2021-04-12 DIAGNOSIS — F333 Major depressive disorder, recurrent, severe with psychotic symptoms: Secondary | ICD-10-CM | POA: Diagnosis not present

## 2021-04-12 DIAGNOSIS — F4321 Adjustment disorder with depressed mood: Secondary | ICD-10-CM

## 2021-04-12 NOTE — Progress Notes (Signed)
    SUBJECTIVE:   CHIEF COMPLAINT / HPI: not sure what follow up is for  Follow up for Mood disorder Sleeping at night getting better, 6-7 hrs night Eating, binge eating, forgets to eat during the day.  Orders from door dash Weight remains same Tolerating medications.  Currently on Buspar 10 mg 5 days a week, Prozac 20 mg stopped taking 2 weeks ago and Trazadone 50mg   Stress:  Close friend recently passed suddenly from Pneumonia.  Reports good home support  SI: talks to partner, doesn't want to  Used to cut self years ago Last night had thoughts but not wanting to act on them.  Reports when having these thought she plays games, watches something funny, wakes partner to talk Doesn't have plan for self harm.     PERTINENT  PMH / PSH:  Hospitalized for Suicide Attempt in 2020 Hospitalized for EtOH overdose April 2021 EtOH use, as been abstinent since April 2021   OBJECTIVE:   BP 96/62   Pulse 82   Ht 5\' 2"  (1.575 m)   Wt 168 lb (76.2 kg)   SpO2 99%   BMI 30.73 kg/m    General: Alert, no acute distress Cardio: Normal S1 and S2, RRR, no r/m/g Pulm: CTAB, normal work of breathing Abdomen: Bowel sounds normal. Abdomen soft and non-tender.  Extremities: No peripheral edema. Pulses present bilaterally Psych: flat affect, engages in conversation and maintains eye contact, no visual or auditory hallucinations.    ASSESSMENT/PLAN:   Mood disorder (HCC) PHQ9 16, positive for SI score 1.  GAD21.  Has not been taking Buspar as prescribed.  Stopped Prozac 2 weeks ago.  Denies any SI/SH today and has no plan. Reports good support from friends and significant other. -Increase Buspar to 10 mg BID  -Restart Prozac 20 mg daily -Continue Trazadone 50 mg qhs -Referral sent to psychiatry, May 2021 area for ease of access -Encouraged to seek out therapist -Will continue to refill current medications until but I think would benefit from formal psychiatric evaluation for medication  adjustment if appropriate. -Follow up appointment scheduled for Sept 20 for evaluation of medication tolerance -Mental health resources provided -Strict return precautions provided   Grief Unexpected loss of close friend at young age.  Reports good home support. -Counseling provided -Will refer to CCM for grief counseling, patient aware that will try to find resources -She is aware that [facility] is here if she needs any further support through this difficult time -Follow up scheduled for 3 weeks     07-08-1996, MD [facility]

## 2021-04-12 NOTE — Assessment & Plan Note (Addendum)
PHQ9 16, positive for SI score 1.  GAD21.  Has not been taking Buspar as prescribed.  Stopped Prozac 2 weeks ago.  Denies any SI/SH today and has no plan. Reports good support from friends and significant other. -Increase Buspar to 10 mg BID  -Restart Prozac 20 mg daily -Continue Trazadone 50 mg qhs -Referral sent to psychiatry, [place] area for ease of access -Encouraged to seek out therapist -Will continue to refill current medications until but I think would benefit from formal psychiatric evaluation for medication adjustment if appropriate. -Follow up appointment scheduled for Sept 20 for evaluation of medication tolerance -Mental health resources provided -Strict return precautions provided

## 2021-04-12 NOTE — Patient Instructions (Addendum)
Thank you for coming to see me today. It was a pleasure  Follow up with your psychiatrist.  If you need to talk to someone there are resources that I have given you today.  Please use these to help you through any times you need assistance.  Continue Trazadone 50 mg at night and Prozac 20 mg daily  Increase Buspar to 10 mg one tablet twice a day  Your PAP smear is due in Dec 2022, please schedule an appointment at that time  Please follow-up with PCP in three weeks  If you have any questions or concerns, please do not hesitate to call the office at [phone].  Best,   Dana Allan, MD    Psychiatry Resource List (Adults and Children) Most of these providers will take Medicaid. please consult your insurance for a complete and updated list of available providers. When calling to make an appointment have your insurance information available to confirm you are covered.  [facility] Health Clinics:   [place]: [address]     [phone] Sidney Ace: [address] [place],        [phone] : [address],    [phone] Kathryne Sharper: [address],                   [phone]  [facility]  (Psychiatry & counseling ; adults & children ; will take Medicaid) [address], [place], [place]        [phone]   [facility]  (Psychiatry only; Adults only, will take Medicaid)  [address], [place], [place] [address]       [phone]   [facility] (Psychiatry & counseling ; adults & children ; will take Medicaid [address]  [place] [address]   [phone]    (Spanish therapist)  [facility]  Psychiatry & counseling; Adults and children;  [address]    [place], [place] [address]    [phone]  [facility] (Psychiatry & counseling; adults & children; Medicare no Medicaid)  [address]   [place], [place]  [address]      [phone]    [facility] (up to age 22)  Psychiatry & counseling ,will take Medicaid, must do counseling to receive psychiatry services  [address] [place] [address]        [phone]  [facility] (Psychiatry & counseling; adults & children; will take Medicaid) [address],  [place], [place]  [phone]  [facility]---  Walk-in Mon-Fri, 8:30-5:00 (will take Medicaid)  [address] [place], [place]  [phone]    [facility] --- Walk-In Mon-Friday 8am-3pm ( will take Medicaid, Psychiatry, Adults & children,  [address] [place], [place]   [phone]   [facility] [place]--, Walk-in M-F 8am-12pm and 1pm -3pm   (Counseling, Psychiatry, will take Medicaid, adults & children)  [address], [place], [place]  [phone]

## 2021-04-13 ENCOUNTER — Encounter: Payer: Self-pay | Admitting: Family Medicine

## 2021-04-13 DIAGNOSIS — F4321 Adjustment disorder with depressed mood: Secondary | ICD-10-CM | POA: Insufficient documentation

## 2021-04-13 NOTE — Assessment & Plan Note (Signed)
Unexpected loss of close friend at young age.  Reports good home support. -Counseling provided -Will refer to CCM for grief counseling, patient aware that will try to find resources -She is aware that [facility] is here if she needs any further support through this difficult time -Follow up scheduled for 3 weeks

## 2021-04-19 ENCOUNTER — Telehealth: Payer: Self-pay | Admitting: *Deleted

## 2021-04-19 NOTE — Chronic Care Management (AMB) (Signed)
  Care Management   Outreach Note  04/19/2021 Name: Beth [name] MRN: [id number] DOB: November 09, 1990  Referred by: Dana Allan, MD Reason for referral : Care Coordination (Initial outreach to schedule referral with SW)   An unsuccessful telephone outreach was attempted today. The patient was referred to the case management team for assistance with care management and care coordination.   Follow Up Plan:  A HIPAA compliant phone message was left for the patient providing contact information and requesting a return call. The care management team will reach out to the patient again over the next 7 days.  If patient returns call to provider office, please advise to call Embedded Care Management Care Guide Misty Stanley at [phone].  Gwenevere Ghazi  Care Guide, Embedded Care Coordination [facility] Management  Direct Dial: [phone]

## 2021-04-27 NOTE — Chronic Care Management (AMB) (Signed)
  Care Management   Note  04/27/2021 Name: [name] MRN: [id number] DOB: [birth year]  Beth [name] is a 30 y.o. year old female who is a primary care patient of Dana Allan, MD. I reached out to [name] by phone today in response to a referral sent by Ms. Arnaldo Natal PCP, Dana Allan, MD.    Ms. Shad was given information about care management services today including:  Care management services include personalized support from designated clinical staff supervised by her physician, including individualized plan of care and coordination with other care providers 24/7 contact phone numbers for assistance for urgent and routine care needs. The patient may stop care management services at any time by phone call to the office staff.  Patient agreed to services and verbal consent obtained.   Follow up plan: Telephone appointment with care management team member scheduled for:05/16/21  [facility] Guide, Embedded Care Coordination [facility]  Care Management  Direct Dial: [phone]
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
## 2021-04-29 NOTE — Patient Instructions (Addendum)
Thank you for coming to see me today. It was a pleasure.    Please follow-up with PCP in 1 weeks  If you have any questions or concerns, please do not hesitate to call the office at [phone].  Best,   Dana Allan, MD   Outpatient Mental Health Providers (No Insurance required or Self Pay)  [facility]  [phone] [email or web address] [facility]  [address] [place], [place] [phone] Crisis [phone]  [facility]) can see uninsured folks for outpatient therapy [email or web address] [address] [place], [place] [address] [phone]  [facility]    Walk-in Mon-Fri, 8am-3pm [email or web address] [email or web address] [address] [place], [place]  [phone]   [address]  [place] [phone] [facility] [place] [facility] for psych med management, there may be a wait- if [facility] is working with clients for OPT, they will coordinate with [facility] for psych  [facility]   Walk-in-Clinic: Monday- Friday 9:00 AM - 4:00 PM [address]   [place], [place] [phone]  [facility] [place] ([employer]) walk in M-F 8am-12pm and  1pm-3pm [place]- [address]     [phone]  [employer] [address]  Phone: [phone]  [employer] (Mental Health and substance challenges) [address]   [place] [phone]    [email or web address]    [address]  [employer] [facility]  [facility] -[address], [place]     Phone:  [phone] [place]-  [address]  [phone]   [facility]  [address] [place]  [phone] [email or web address]   [facility] ( virtual or zoom therapy) [email or web address]  [address]  [phone]     [facility] [phone]  grief counseling, dementia and caregiver support    [facility] Walk-in MWF 12:30 to 3:00     [address] [place] [address]  [phone]  www.[email or web address] call to schedule an appointment     National Alliance on Mental Illness (NAMI) [place]- Wellness classes, Support groups        [address], [place], [place] [address] [phone]   [email or web address]   [facility]  (Psycho-social Rehabilitation clubhouse, Individual and group therapy) [address] [place], [place] [address]   [phone]  24- Hour Availability:  [facility] [phone] or [phone] * [facility] [employer] (Domestic Violence, Rape, etc. )9174040036 [place]esta [employer]er [phone] or 3401802324 * [facility] [place] [facility] ([phone] only) [phone] (after hours) *[facility] Mobile Crisis Unit [phone] *[place] National Suicide Hotline (304) 207-1167 Len Childs)

## 2021-05-03 ENCOUNTER — Ambulatory Visit (INDEPENDENT_AMBULATORY_CARE_PROVIDER_SITE_OTHER): Payer: BC Managed Care – PPO | Admitting: Family Medicine

## 2021-05-03 ENCOUNTER — Other Ambulatory Visit: Payer: Self-pay

## 2021-05-03 ENCOUNTER — Encounter: Payer: Self-pay | Admitting: Family Medicine

## 2021-05-03 VITALS — BP 100/64 | HR 78 | Ht 62.0 in | Wt 167.6 lb

## 2021-05-03 DIAGNOSIS — F39 Unspecified mood [affective] disorder: Secondary | ICD-10-CM

## 2021-05-04 ENCOUNTER — Encounter: Payer: Self-pay | Admitting: Family Medicine

## 2021-05-04 NOTE — Assessment & Plan Note (Signed)
PHQ 9 and GAD reviewed and increased at this visit. Positive answer to question 9 for SI.  Discussed with patient and she currently has no active plan.  She has a plan to discuss these unwanted thoughts with her boyfriend and made it clear to me that she did not want to end her life.  Her boyfriend has a gun at home but she reports that she does not know where this is.  She has a cat that helps her with anxiety.  Currently looking for mental health provider. -Will continue current medication, I think she will benefit from seeing a Psychiatrist for evaluation and possible adjustment of medications  -Follow up with SW as scheduled Oct 10 -Mental health resources provided -Strict return precautions provided -Follow up with me next week, I am looking forward to seeing more pictures of her cat and the costumes

## 2021-05-10 ENCOUNTER — Other Ambulatory Visit: Payer: Self-pay

## 2021-05-10 ENCOUNTER — Telehealth (INDEPENDENT_AMBULATORY_CARE_PROVIDER_SITE_OTHER): Payer: BC Managed Care – PPO | Admitting: Family Medicine

## 2021-05-10 DIAGNOSIS — F39 Unspecified mood [affective] disorder: Secondary | ICD-10-CM

## 2021-05-10 NOTE — Patient Instructions (Addendum)
Thank you for coming to see me today. It was a pleasure.   Continue current medication  Follow up with CCM as scheduled in October  Referral has been sent to Psychiatry 08/30  Please follow-up with PCP 2-4 weeks  If you have any questions or concerns, please do not hesitate to call the office at [phone].  Best,   Dana Allan, MD    Psychiatry Resource List (Adults and Children) Most of these providers will take Medicaid. please consult your insurance for a complete and updated list of available providers. When calling to make an appointment have your insurance information available to confirm you are covered.  [facility] Behavioral Health Clinics:   [place]: [address]     [phone] Sidney Ace: [address] [place],        [phone] [place]: [address],    [phone] Kathryne Sharper: [address],                   [phone]  [facility]  (Psychiatry & counseling ; adults & children ; will take Medicaid) [address], [place], [place]        [phone]   [facility]  (Psychiatry only; Adults only, will take Medicaid)  [address], [place], [place] [address]       [phone]   [facility] (Psychiatry & counseling ; adults & children ; will take Medicaid [address]  [place] [address]   [phone]    (Spanish therapist)  [facility]  Psychiatry & counseling; Adults and children;  [address]    [place], [place] [address]    [phone]  [facility] (Psychiatry & counseling; adults & children; Medicare no Medicaid)  [address]   [place], [place]  [address]      [phone]    [facility] (up to age 30)  Psychiatry & counseling ,will take Medicaid, must do counseling to receive psychiatry services  [address] [place] [address]         [phone]  [facility] (Psychiatry & counseling; adults & children; will take Medicaid) [address],  [place], [place]  [phone]  [facility]---  Walk-in Mon-Fri, 8:30-5:00 (will take Medicaid)  [address] [place], [place]  [phone]    [facility] --- Walk-In Mon-Friday 8am-3pm ( will take Medicaid, Psychiatry, Adults & children,  [address], [place], [place]   [phone]   [facility] [place]--, Walk-in M-F 8am-12pm and 1pm -3pm   (Counseling, Psychiatry, will take Medicaid, adults & children)  [address] [place], [place]  [phone]
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
## 2021-05-12 ENCOUNTER — Encounter: Payer: Self-pay | Admitting: Family Medicine

## 2021-05-12 NOTE — Assessment & Plan Note (Signed)
Seems to be coping well.  Has good social support for when having SI thoughts. Does not have active plan.  Does have a scheduled appointment with Social Worker next week.  Hoping this will help her find a psychiatrist and therapist.   -Continue current medications -She has 24 Crisis line if needed or can MyChart me -Follow up 4 weeks or sooner if needed

## 2021-05-16 ENCOUNTER — Ambulatory Visit: Payer: BC Managed Care – PPO | Admitting: Licensed Clinical Social Worker

## 2021-05-16 DIAGNOSIS — Z7189 Other specified counseling: Secondary | ICD-10-CM

## 2021-05-16 NOTE — Patient Instructions (Signed)
Licensed Clinical Social Worker Visit Information  Goals we discussed today:   Goals Addressed             This Visit's Progress    Connect for ongoing counseling   On track    Patient Goals/Self-Care Activities: Over the next 14 days Complete intake form with [facility] they will call you in 3 days [phone] [email or web address]          [place] was given information about Care Management services today including:  Care Management services include personalized support from designated clinical staff supervised by [name] "[name]"'s physician, including individualized plan of care and coordination with other care providers 24/7 contact phone numbers for assistance for urgent and routine care needs. The patient may stop Care Management services at any time by phone call to the office staff.   Patient agreed to services and verbal consent obtained.   Patient verbalizes understanding of instructions provided today and agrees to view in MyChart.   Follow up plan: Appointment scheduled for SW follow up with client by phone on: 05/31/2021  Sammuel Hines, LCSW Care Management & Coordination  [facility] / [facility] [employer]   [phone]

## 2021-05-16 NOTE — Chronic Care Management (AMB) (Signed)
Care Management Clinical Social Work Note  05/16/2021 Name: Beth [name] MRN: [id number] DOB: 07-28-1991  Beth [name] is a 30 y.o. year old adult who is a primary care patient of Beth Allan, MD.  The Care Management team was consulted for assistance with chronic disease management and coordination needs.  Engaged with patient by telephone for initial visit in response to provider referral for social work chronic care management and care coordination services  Consent to Services:    [name] was given information about Care Management services today including:  Care Management services includes personalized support from designated clinical staff supervised by [name] "[name]"'s physician, including individualized plan of care and coordination with other care providers 24/7 contact phone numbers for assistance for urgent and routine care needs. The patient may stop case management services at any time by phone call to the office staff.  Patient agreed to services and consent obtained.   Assessment: Mental Health Counseling and Resources.   Patient is currently experiencing difficulty with managing her mental health.  Psychiatry referral placed by PCP at [facility], Patient would like a provider in [facility] as she has recently moved to [place].. See Care Plan below for interventions and patient self-care actives.  Recommendation: Patient may benefit from, and is in agreement to complete first visit request form with the [facility].  LCSW offered to assist during this encounter, patient declined.   Follow up Plan: Patient would like continued follow-up from CCM LCSW .  per patient's request will follow up in 2 weeks.  Will call office if needed prior to next encounter.   Review of patient past medical history, allergies, medications, and health status, including review of relevant consultants reports was performed today as part of a comprehensive  evaluation and provision of chronic care management and care coordination services.  SDOH (Social Determinants of Health) assessments and interventions performed:  SDOH Interventions    Flowsheet Row Most Recent Value  SDOH Interventions   SDOH Interventions for the Following Domains Depression  Depression Interventions/Treatment  Referral to Psychiatry, Counseling        Advanced Directives Status: Not addressed in this encounter.  Care Plan  Allergies  Allergen Reactions   Benadryl Allergy [Diphenhydramine Hcl] Other (See Comments)    Reports makes her feel nervous and jerky   Latex Other (See Comments)    irritation    Outpatient Encounter Medications as of 05/16/2021  Medication Sig   busPIRone (BUSPAR) 10 MG tablet Take 1 tablet (10 mg total) by mouth 2 (two) times daily.   Cannabinoids (THC FREE PO) Take by mouth.   FLUoxetine (PROZAC) 20 MG capsule Take 1 capsule (20 mg total) by mouth in the morning.   glycopyrrolate (ROBINUL) 2 MG tablet Take 1 tablet (2 mg total) by mouth 2 (two) times daily as needed (abdominal pain/ diarrhea). (Patient not taking: No sig reported)   levonorgestrel (MIRENA) 20 MCG/24HR IUD 1 each by Intrauterine route once.   ondansetron (ZOFRAN) 4 MG tablet Take 1 tablet (4 mg total) by mouth every 8 (eight) hours as needed for nausea or vomiting.   pantoprazole (PROTONIX) 40 MG tablet TAKE 1 TABLET(40 MG) BY MOUTH DAILY   traZODone (DESYREL) 50 MG tablet Take 1 tablet (50 mg total) by mouth at bedtime.   No facility-administered encounter medications on file as of 05/16/2021.    Patient Active Problem List   Diagnosis Date Noted   Grief 04/13/2021   Mood disorder (  HCC) 04/12/2021   Insomnia 03/07/2021   Alcohol use disorder, severe, dependence (HCC) 12/02/2019   Suicidal ideation    Anxiety 11/10/2019   Mild concussion 04/20/2019   MDD (major depressive disorder), recurrent episode, severe (HCC) 12/19/2018   Dyspepsia 09/02/2018   Tobacco  use disorder 01/11/2018   Abnormal Pap smear of cervix 10/01/2017   Post traumatic stress disorder (PTSD) 06/14/2016   MDD (major depressive disorder), recurrent, severe, with psychosis (HCC) 06/05/2016    Conditions to be addressed/monitored: Anxiety and Depression; Mental Health Concerns   Care Plan : General Social Work (Adult)  Updates made by Soundra Pilon, LCSW since 05/16/2021 12:00 AM     Problem: Depression Identification (Depression)      Goal: Connect for counseling   Start Date: 07/14/2020  Note:   Current barriers:   Chronic Mental Health needs related to symptoms of depression Lacks knowledge of community resource: for mental health treatment Needs Support, Education, and Care Coordination in order to meet unmet mental health needs. Clinical Goal(s): patient will work with [facility] to address needs related to therapy and medication managment   Clinical Interventions:  Assessed patient's previous and current treatment, coping skills, support system and barriers to care  Depression screen reviewed  Solution-Focused Strategies Problem Solving /Task Center  Suicidal Ideation/Homicidal Ideation assessed: Discussed referral for psychiatry  Discuss barriers, setting reminders and follow through  ; Review various resources, discussed options and provided patient information about  Options for mental health treatment based on need and insurance Called [facility] with patient on the line to review process, insurance etc 1 week for medication and 4 weeks for counseling Inter-disciplinary care team collaboration (see longitudinal plan of care) Patient Goals/Self-Care Activities: Over the next 14 days Complete intake form [facility]  they will call you in 3 days [phone] [email or web address]     Sammuel Hines, LCSW Care Management & Coordination  [facility] / [employer]    [phone]

## 2021-05-29 ENCOUNTER — Other Ambulatory Visit: Payer: Self-pay | Admitting: Family Medicine

## 2021-05-29 DIAGNOSIS — F419 Anxiety disorder, unspecified: Secondary | ICD-10-CM

## 2021-05-31 ENCOUNTER — Ambulatory Visit: Payer: BC Managed Care – PPO | Admitting: Licensed Clinical Social Worker

## 2021-05-31 DIAGNOSIS — Z789 Other specified health status: Secondary | ICD-10-CM

## 2021-05-31 NOTE — Chronic Care Management (AMB) (Signed)
Care Management  Clinical Social Work Note  05/31/2021 Name: Beth [name] MRN: [id number] DOB: 1991-07-21  Beth [name] is a 30 y.o. year old adult who is a primary care patient of Dana Allan, MD. The CCM team was consulted for assistance with care coordination needs.  Connect for ongoing counseling     Consent to Services:  The patient was given information about Care Management services, agreed to services, and gave verbal consent prior to initiation of services.  Please see initial visit note for detailed documentation.   Patient agreed to services today and consent obtained.  Engaged with patient by telephone for follow up visit in response to provider referral for social work care coordination services.   Assessment/Interventions: Assessed patient's current treatment, progress, coping skills, support system and barriers to care.  She is making progress with connecting with the [facility] for medication evaluation and ongoing therapy. She completed all required forms and waiting for an appointment.  Reports doing well with managing emotions and has support family .  See Care Plan below for interventions and patient self-care actives.  Recommendation: Patient may benefit from, and is in agreement utilize EMMI education and supportive interventions provided today.   Follow up Plan:  Patient does not require or desire continued follow-up by LCSW. Will contact the office if needed Patient may benefit from and is in agreement for CCM LCSW to remain part of care team for the next 90 days.  If no needs are identified in the next 90 days, CCM LCSW will disconnect from the care team.   Review of patient past medical history, allergies, medications, and health status, including review of pertinent consultant reports was performed as part of comprehensive evaluation and provision of care management/care coordination services.   SDOH (Social Determinants of Health) screening and  interventions performed today:   Advanced Directives Status:Not addressed in this encounter.     Care Plan    Conditions to be addressed/monitored per PCP order: Anxiety and Depression,   Care Plan : General Social Work (Adult)  Updates made by Soundra Pilon, LCSW since 05/31/2021 12:00 AM     Problem: Depression Identification (Depression)      Goal: Connect for counseling   Start Date: 07/14/2020  This Visit's Progress: On track  Note:   Current barriers:   Waiting for [facility] to call with her with appointment Chronic Mental Health needs related to symptoms of depression Lacks knowledge of community resource: for mental health treatment Needs Support, Education, and Care Coordination in order to meet unmet mental health needs. Clinical Goal(s): patient will work with [facility] to address needs related to therapy and medication managment   Clinical Interventions:  Assessed patient's previous and current treatment, coping skills, support system and barriers to care  Solution-Focused Strategies employed: with connected for mental health treatment Problem Solving /Task Center strategies reviewed Provided EMMI education information on Movement: Emotional Health and Relieving Stress Discuss barriers, setting reminders and follow through  ; Review various resources, discussed options and provided patient information about  Options for mental health treatment based on need and insurance Called [facility] with patient on the line to review process, insurance etc 1 week for medication and 4 weeks for counseling Inter-disciplinary care team collaboration (see longitudinal plan of care) Patient Goals/Self-Care Activities:  Keep appointment with [facility]    Beth Hines, LCSW Care Management & Coordination  [facility] / [employer]  [phone]    

## 2021-05-31 NOTE — Patient Instructions (Signed)
Visit Information   Goals Addressed             This Visit's Progress    Connect for ongoing counseling   On track    Patient Goals/Self-Care Activities:  [facility]       Patient verbalizes understanding of instructions provided today and agrees to view in MyChart.   It was a pleasure speaking with you today. Please call the office if needed No follow up scheduled, you do not desire or require continued follow up Per our conversation I will remain part of your care team for the next 90 days.  If no needs are identified in the next 90 days, I will disconnect from the care team.  Sammuel Hines, LCSW Care Management & Coordination  [phone]   Sammuel Hines, [place] Care Management & Coordination  [phone]  Sammuel Hines, [place] Care Management & Coordination  [phone]

## 2021-06-14 DIAGNOSIS — U071 COVID-19: Secondary | ICD-10-CM

## 2021-06-14 HISTORY — DX: COVID-19: U07.1

## 2021-06-20 DIAGNOSIS — F4310 Post-traumatic stress disorder, unspecified: Secondary | ICD-10-CM | POA: Diagnosis not present

## 2021-06-20 DIAGNOSIS — F1011 Alcohol abuse, in remission: Secondary | ICD-10-CM | POA: Diagnosis not present

## 2021-06-20 DIAGNOSIS — F1210 Cannabis abuse, uncomplicated: Secondary | ICD-10-CM | POA: Diagnosis not present

## 2021-06-20 DIAGNOSIS — F3181 Bipolar II disorder: Secondary | ICD-10-CM | POA: Diagnosis not present

## 2021-06-21 DIAGNOSIS — F4310 Post-traumatic stress disorder, unspecified: Secondary | ICD-10-CM | POA: Diagnosis not present

## 2021-06-21 DIAGNOSIS — F1011 Alcohol abuse, in remission: Secondary | ICD-10-CM | POA: Diagnosis not present

## 2021-06-21 DIAGNOSIS — F1210 Cannabis abuse, uncomplicated: Secondary | ICD-10-CM | POA: Diagnosis not present

## 2021-06-21 DIAGNOSIS — F3181 Bipolar II disorder: Secondary | ICD-10-CM | POA: Diagnosis not present

## 2021-07-05 DIAGNOSIS — F17200 Nicotine dependence, unspecified, uncomplicated: Secondary | ICD-10-CM | POA: Diagnosis not present

## 2021-07-05 DIAGNOSIS — F1210 Cannabis abuse, uncomplicated: Secondary | ICD-10-CM | POA: Diagnosis not present

## 2021-07-05 DIAGNOSIS — F3181 Bipolar II disorder: Secondary | ICD-10-CM | POA: Diagnosis not present

## 2021-07-05 DIAGNOSIS — F1011 Alcohol abuse, in remission: Secondary | ICD-10-CM | POA: Diagnosis not present

## 2021-07-18 DIAGNOSIS — F3181 Bipolar II disorder: Secondary | ICD-10-CM | POA: Diagnosis not present

## 2021-07-18 DIAGNOSIS — F1210 Cannabis abuse, uncomplicated: Secondary | ICD-10-CM | POA: Diagnosis not present

## 2021-07-18 DIAGNOSIS — F17200 Nicotine dependence, unspecified, uncomplicated: Secondary | ICD-10-CM | POA: Diagnosis not present

## 2021-07-18 DIAGNOSIS — F1011 Alcohol abuse, in remission: Secondary | ICD-10-CM | POA: Diagnosis not present

## 2021-07-19 DIAGNOSIS — F3181 Bipolar II disorder: Secondary | ICD-10-CM | POA: Diagnosis not present

## 2021-07-19 DIAGNOSIS — F4310 Post-traumatic stress disorder, unspecified: Secondary | ICD-10-CM | POA: Diagnosis not present

## 2021-07-19 DIAGNOSIS — F1210 Cannabis abuse, uncomplicated: Secondary | ICD-10-CM | POA: Diagnosis not present

## 2021-07-19 DIAGNOSIS — F1011 Alcohol abuse, in remission: Secondary | ICD-10-CM | POA: Diagnosis not present

## 2021-07-23 IMAGING — CR DG CHEST 2V
2 series · 2 of 2 positions shown · non-contrast
Comparison: 04/13/2019

CLINICAL DATA: Shortness of breath.

EXAM:
CHEST - 2 VIEW

[w chest lat]
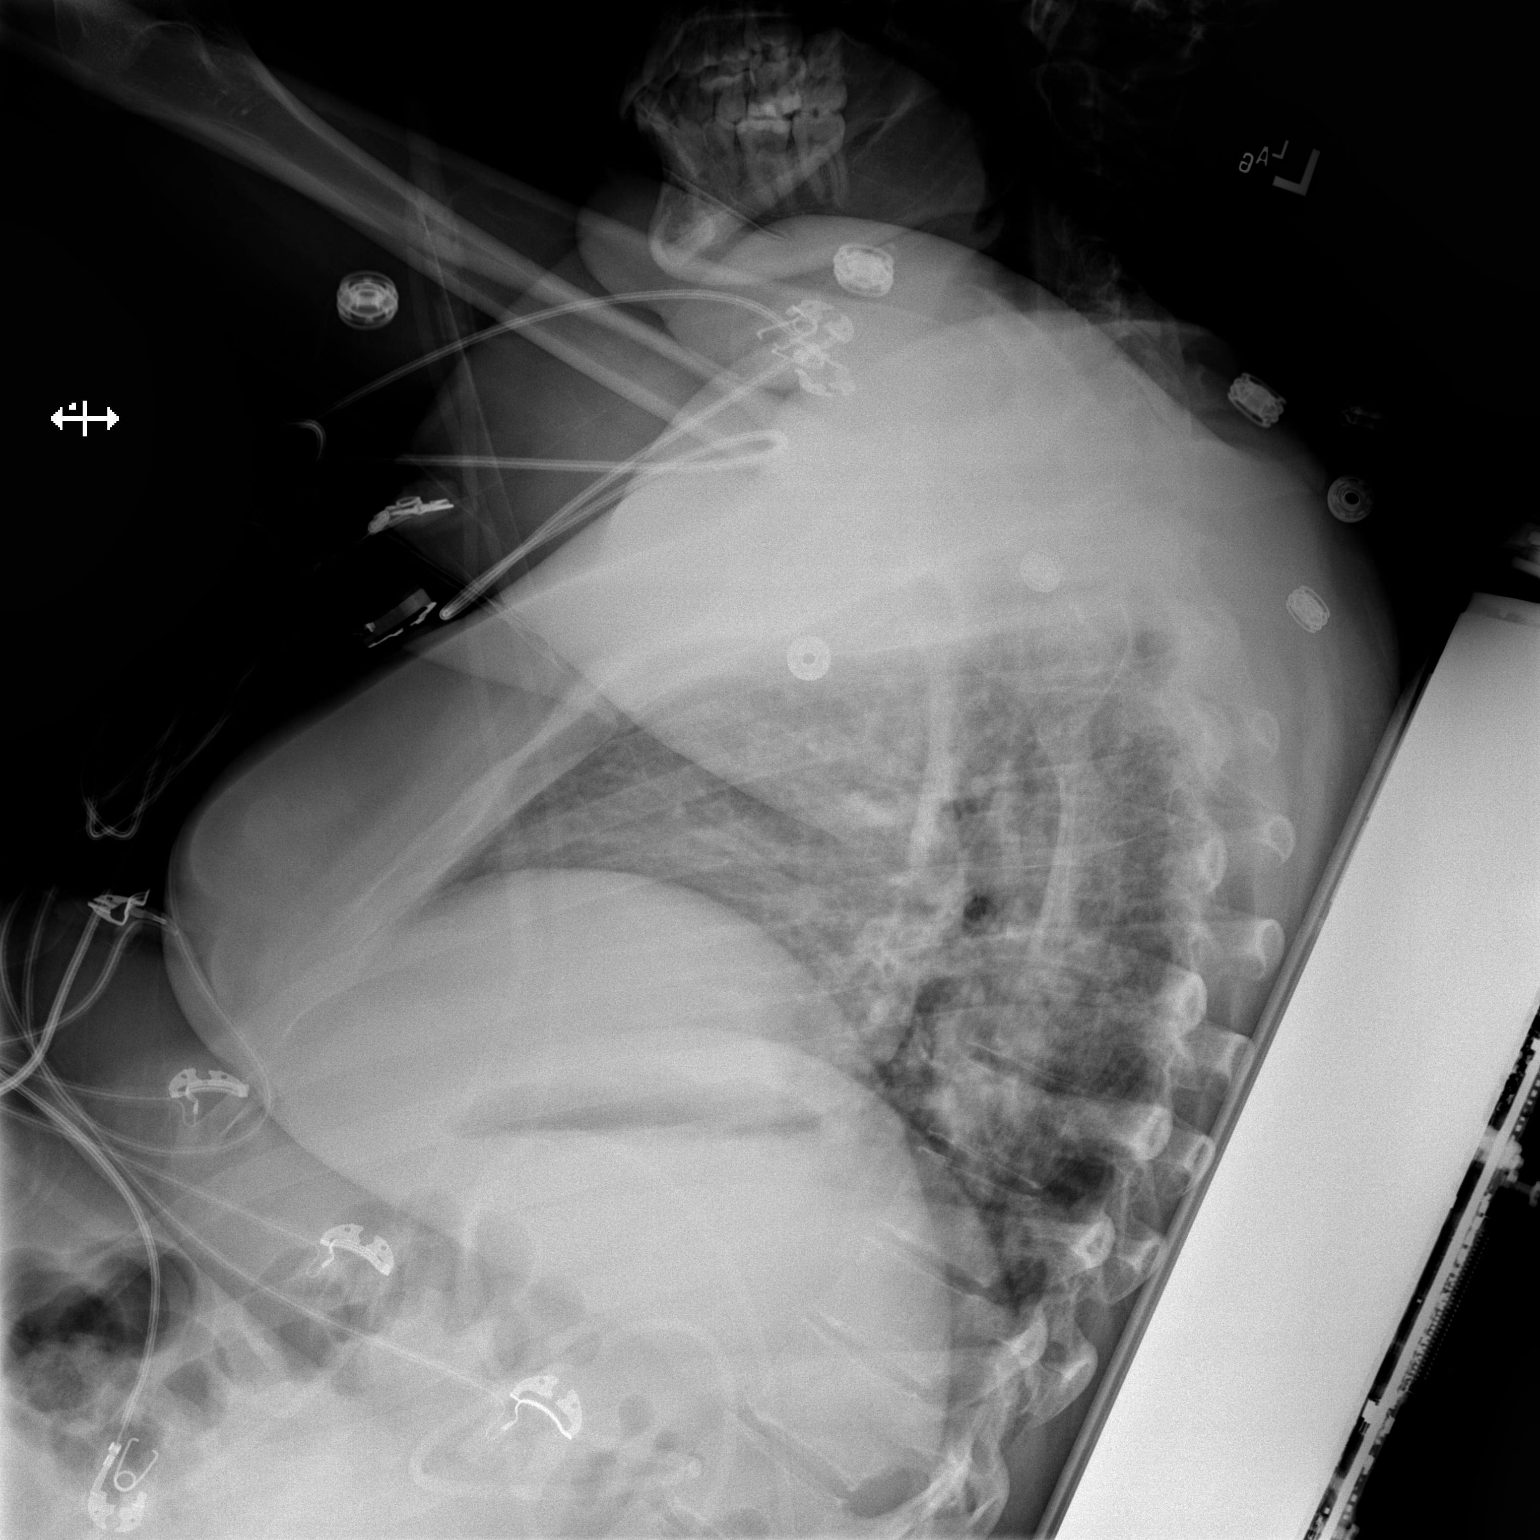

[x chest ap]
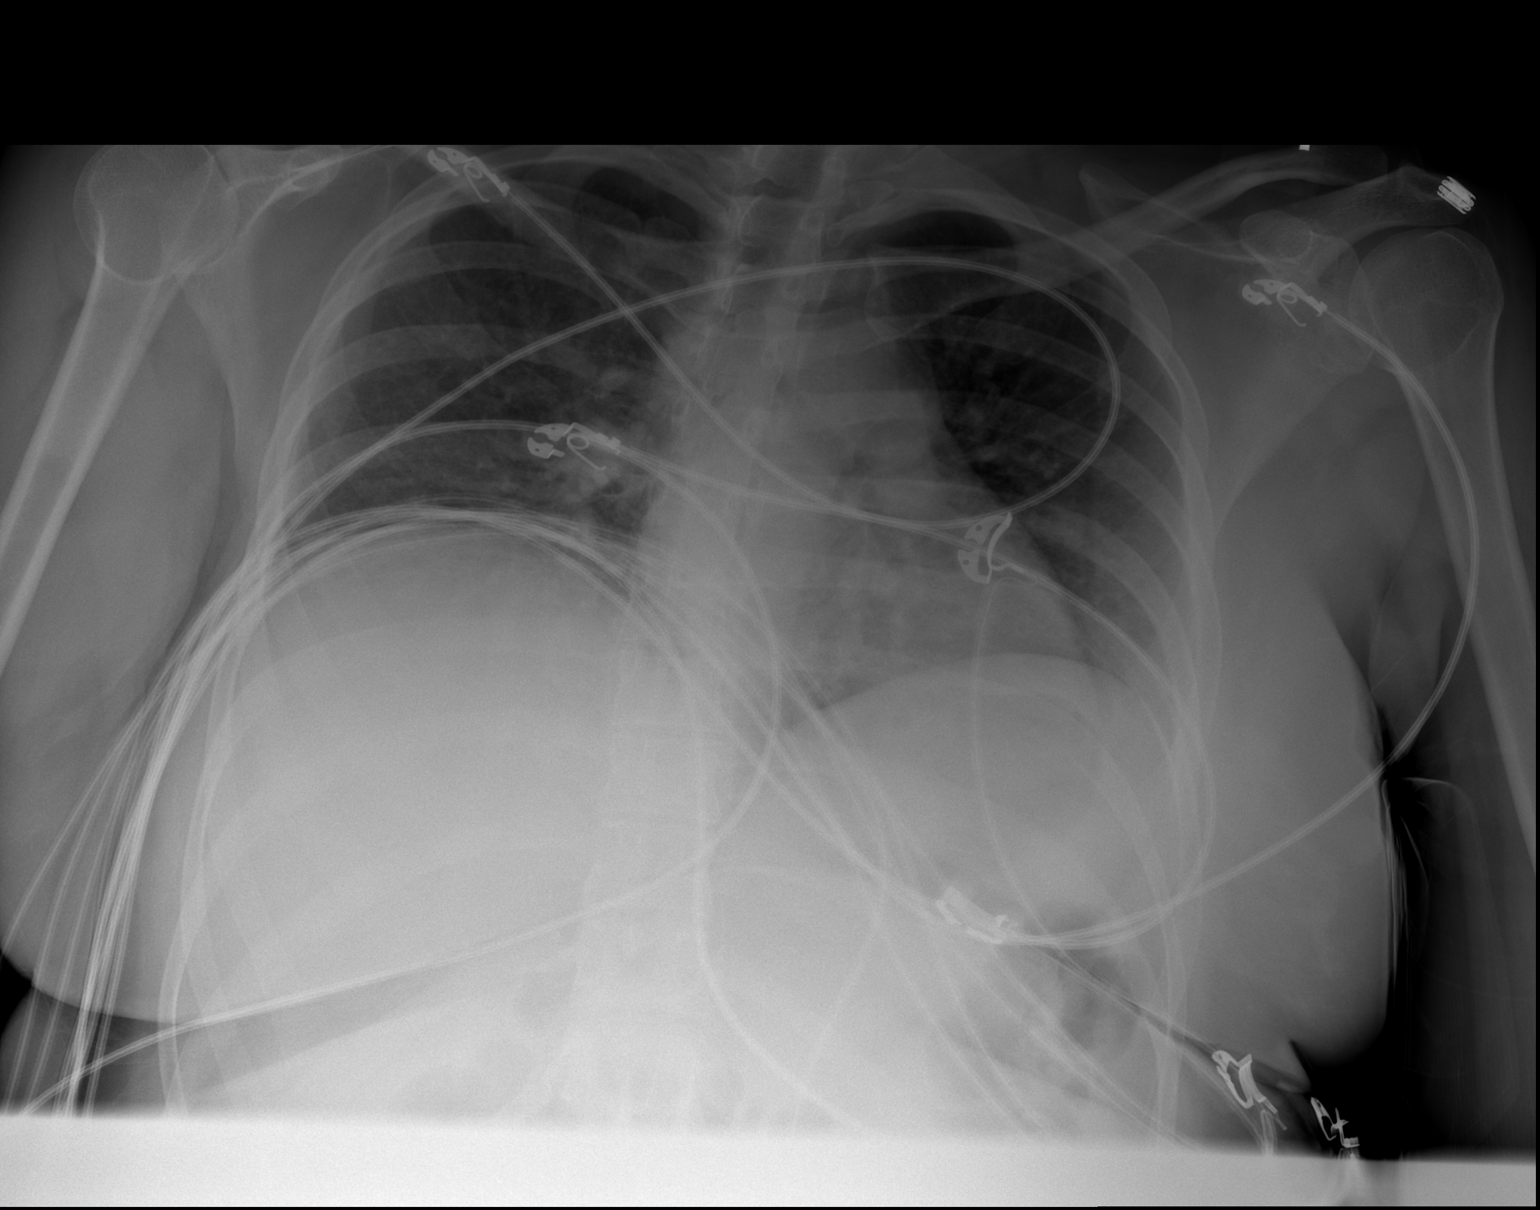

[2 of 2 positions shown; findings below may reference images not displayed]

FINDINGS: The cardiac silhouette, mediastinal and hilar contours are within
normal given the AP projection.

Very low lung volumes with vascular crowding and streaky
atelectasis. No obvious infiltrates or effusions. The bony thorax is
intact.
IMPRESSION: Very low lung volumes with vascular crowding and streaky
atelectasis.

## 2021-07-26 DIAGNOSIS — F17200 Nicotine dependence, unspecified, uncomplicated: Secondary | ICD-10-CM | POA: Diagnosis not present

## 2021-07-26 DIAGNOSIS — F3181 Bipolar II disorder: Secondary | ICD-10-CM | POA: Diagnosis not present

## 2021-07-26 DIAGNOSIS — F1210 Cannabis abuse, uncomplicated: Secondary | ICD-10-CM | POA: Diagnosis not present

## 2021-07-26 DIAGNOSIS — F1011 Alcohol abuse, in remission: Secondary | ICD-10-CM | POA: Diagnosis not present

## 2021-08-14 DIAGNOSIS — R87612 Low grade squamous intraepithelial lesion on cytologic smear of cervix (LGSIL): Secondary | ICD-10-CM

## 2021-08-14 HISTORY — DX: Low grade squamous intraepithelial lesion on cytologic smear of cervix (LGSIL): R87.612

## 2021-08-16 ENCOUNTER — Ambulatory Visit (INDEPENDENT_AMBULATORY_CARE_PROVIDER_SITE_OTHER): Payer: BC Managed Care – PPO

## 2021-08-16 ENCOUNTER — Encounter: Payer: Self-pay | Admitting: Family Medicine

## 2021-08-16 ENCOUNTER — Other Ambulatory Visit (HOSPITAL_COMMUNITY)
Admission: RE | Admit: 2021-08-16 | Discharge: 2021-08-16 | Disposition: A | Discharge status: Home / Self Care | Payer: BC Managed Care – PPO | Source: Ambulatory Visit | Attending: Family Medicine | Admitting: Family Medicine

## 2021-08-16 ENCOUNTER — Ambulatory Visit (INDEPENDENT_AMBULATORY_CARE_PROVIDER_SITE_OTHER): Payer: BC Managed Care – PPO | Admitting: Family Medicine

## 2021-08-16 ENCOUNTER — Other Ambulatory Visit: Payer: Self-pay

## 2021-08-16 VITALS — BP 112/79 | HR 118 | Ht 61.0 in | Wt 170.1 lb

## 2021-08-16 DIAGNOSIS — Z8742 Personal history of other diseases of the female genital tract: Secondary | ICD-10-CM

## 2021-08-16 DIAGNOSIS — Z113 Encounter for screening for infections with a predominantly sexual mode of transmission: Secondary | ICD-10-CM

## 2021-08-16 DIAGNOSIS — Z23 Encounter for immunization: Secondary | ICD-10-CM | POA: Diagnosis not present

## 2021-08-16 DIAGNOSIS — R87612 Low grade squamous intraepithelial lesion on cytologic smear of cervix (LGSIL): Secondary | ICD-10-CM

## 2021-08-16 DIAGNOSIS — N898 Other specified noninflammatory disorders of vagina: Secondary | ICD-10-CM | POA: Diagnosis not present

## 2021-08-16 DIAGNOSIS — R300 Dysuria: Secondary | ICD-10-CM | POA: Diagnosis not present

## 2021-08-16 DIAGNOSIS — Z01419 Encounter for gynecological examination (general) (routine) without abnormal findings: Secondary | ICD-10-CM | POA: Diagnosis present

## 2021-08-16 LAB — POCT WET PREP (WET MOUNT)
Clue Cells Wet Prep Whiff POC: NEGATIVE
Trichomonas Wet Prep HPF POC: ABSENT

## 2021-08-16 LAB — POCT URINALYSIS DIP (MANUAL ENTRY)
Bilirubin, UA: NEGATIVE
Glucose, UA: NEGATIVE mg/dL
Ketones, POC UA: NEGATIVE mg/dL
Leukocytes, UA: NEGATIVE
Nitrite, UA: NEGATIVE
Spec Grav, UA: >=1.03 — AB (ref 1.010–1.025)
Urobilinogen, UA: 0.2 E.U./dL
pH, UA: 5.5 (ref 5.0–8.0)

## 2021-08-16 LAB — POCT UA - MICROSCOPIC ONLY

## 2021-08-16 NOTE — Assessment & Plan Note (Signed)
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 Had repeat colpo in Feb 2020 which was normal. At that time they recommended repeat pap w/cotesting in 1 year due to hx of LGSIL. -Pap obtained today

## 2021-08-16 NOTE — Patient Instructions (Addendum)
It was great to meet you!  Today we did a pap smear, which is screening for cervical cancer. We also did routine STI testing and checked for bacterial overgrowth and yeast. We will send you a MyChart message with the results or call if they are abnormal.   Your Mirena was placed in 2017. It is good for 8 years, which means you're not due for removal until 2025.  Take care and seek immediate care sooner if you develop any concerns.  Dr. [facility]

## 2021-08-16 NOTE — Progress Notes (Signed)
° ° °  SUBJECTIVE:   CHIEF COMPLAINT / HPI:   Vaginal discomfort/discharge Symptoms started about 1 month ago. Patient thought they may have a yeast infection so they took a dose of OTC Monostat about 3 weeks ago. Symptoms got slightly better for a while, but then recurred. They took another dose of Monostat this week and now symptoms seem to be worse. Endorses white, slightly malodorous discharge. However, the discomfort is more bothersome than the discharge. Also endorsing dyspareunia over past few weeks which is new.   Patient is sexually active with 1 female partner. Has Mirena IUD for contraception. 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 Had repeat colpo in Feb 2020 which was normal. At that time they recommended repeat pap w/cotesting in 1 year due to hx of LGSIL.  ?Infected eyebrow piercing Patient has left eyebrow piercing which is somewhat painful and red. It has been this way since it was pierced several months ago. "Has never healed properly". No systemic symptoms, streaking erythema or purulent drainage.  PERTINENT  PMH / PSH: bipolar disorder  OBJECTIVE:   BP 112/79    Pulse (!) 118    Ht 5\' 1"  (1.549 m)    Wt 170 lb 2 oz (77.2 kg)    SpO2 100%    BMI 32.14 kg/m   General: NAD, pleasant, able to participate in exam Respiratory: No respiratory distress Skin: warm and dry, no rashes noted. L eyebrow piercing in place with mild surrounding erythema. No drainage, warmth, or induration. Psych: Normal affect and mood Neuro: grossly intact GU/GYN: Exam performed in the presence of a chaperone. External genitalia within normal limits.  Vaginal mucosa pink, moist, normal rugae.  Nonfriable cervix. No obvious lesions. No discharge or bleeding noted on speculum exam.  IUD strings visualized.    ASSESSMENT/PLAN:   Vaginal discharge Present for past several weeks. Associated w/vaginal discomfort and dyspareunia. -Wet prep obtained--many bacteria but otherwise normal (no clue cells, whiff negative, no yeast. 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 Had repeat colpo in Feb 2020 which was normal. At that time they recommended repeat pap w/cotesting in 1 year due to hx of LGSIL. -Pap obtained today   Irritation of Eyebrow Piercing No evidence of acute infection on exam today, however does not appear to be healing well despite being pierced several months ago. Advised removal and patient was agreeable. The piercing was removed without difficulty while in the office today.   Mar 2020, MD [facility] / [facility]

## 2021-08-16 NOTE — Assessment & Plan Note (Addendum)
Present for past several weeks. Associated w/vaginal discomfort and dyspareunia. -Wet prep obtained--many bacteria but otherwise normal (no clue cells, whiff negative, no yeast. negative for trich) -UA without signs of infection -GC/chlamydia pending (sent w/pap) -HIV/RPR obtained today

## 2021-08-17 ENCOUNTER — Encounter: Payer: Self-pay | Admitting: Family Medicine

## 2021-08-17 LAB — HIV ANTIBODY (ROUTINE TESTING W REFLEX): HIV Screen 4th Generation wRfx: NONREACTIVE

## 2021-08-17 LAB — RPR: RPR Ser Ql: NONREACTIVE

## 2021-08-18 LAB — CYTOLOGY - PAP
Chlamydia: NEGATIVE
Comment: NEGATIVE
Comment: NEGATIVE
Comment: NORMAL
High risk HPV: POSITIVE — AB
Neisseria Gonorrhea: NEGATIVE

## 2021-08-18 NOTE — Telephone Encounter (Signed)
Can you please address this as you seen the patient for this concern?  Thanks

## 2021-08-19 ENCOUNTER — Telehealth: Payer: Self-pay | Admitting: Family Medicine

## 2021-08-19 NOTE — Telephone Encounter (Signed)
Called patient to discuss Pap results. Patient with LSIL and positive HPV.  History of abnormal pap in 2018 showing LSIL but also with features of HSIL at that time. Had colpo in Feb 2019 with 3 biopsies that were CIN-1.  Subsequently had Pap in 2019 with +HPV but NILM and colpo in 2020 at which time no biopsies were taken.  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  Patient scheduled in colpo clinic on 09/01/21 at 9:20am. However, she reports Thursdays are really not ideal for her in terms of scheduling. She works on Thursdays and her job is not flexible about missing days, even with a doctor's note. She may opt to cancel this appointment and pursue colposcopy with OBGYN. She was given the number for [facility].

## 2021-08-20 ENCOUNTER — Encounter: Payer: Self-pay | Admitting: Family Medicine

## 2021-08-22 NOTE — Telephone Encounter (Signed)
Patient will need to schedule an appointment with a provider to discuss this.  Thanks Dana Allan, MD [facility]

## 2021-08-30 ENCOUNTER — Telehealth: Payer: Self-pay

## 2021-08-30 MED ORDER — MELOXICAM 7.5 MG PO TABS: ORAL_TABLET | ORAL | 0 refills | Status: DC

## 2021-08-30 NOTE — Telephone Encounter (Signed)
Dear [facility] Team I have sent iin a rx for meloxicam and instructions on how to take it. We can discuss her desire for hysterectomy at the office visit but this colposcopy needs to be done even if she is plkanning future hysterectomy.

## 2021-08-30 NOTE — Telephone Encounter (Signed)
Patient calls nurse line regarding upcoming colposcopy. Patient reports that she had a lot of pain with last colposcopy and wanted to discuss pain management prior to procedure on Thursday, 1/19. Patient reports that she used tylenol and ibuprofen last time that was not effective in managing her pain.   Patient is also requesting to move forward with hysterectomy. Advised patient that a referral would need to be placed to specialist to further address this.   Forwarding to PCP and Dr. Nori Riis. (attending in [place] [facility] on Thursday) Please advise.   Talbot Grumbling, RN

## 2021-08-31 NOTE — Telephone Encounter (Signed)
Pt informed of below.Armine Rizzolo Zimmerman Rumple, CMA ? ?

## 2021-09-01 ENCOUNTER — Other Ambulatory Visit: Payer: Self-pay | Admitting: Family Medicine

## 2021-09-01 ENCOUNTER — Ambulatory Visit (INDEPENDENT_AMBULATORY_CARE_PROVIDER_SITE_OTHER): Payer: Self-pay | Admitting: Family Medicine

## 2021-09-01 ENCOUNTER — Other Ambulatory Visit: Payer: Self-pay

## 2021-09-01 VITALS — BP 98/62 | HR 88 | Wt 171.0 lb

## 2021-09-01 DIAGNOSIS — R87618 Other abnormal cytological findings on specimens from cervix uteri: Secondary | ICD-10-CM

## 2021-09-01 DIAGNOSIS — R87612 Low grade squamous intraepithelial lesion on cytologic smear of cervix (LGSIL): Secondary | ICD-10-CM

## 2021-09-01 LAB — POCT URINE PREGNANCY: Preg Test, Ur: NEGATIVE

## 2021-09-01 NOTE — Patient Instructions (Signed)
Colposcopy, Care After The following information offers guidance on how to care for yourself after your procedure. Your doctor may also give you more specific instructions. If you have problems or questions, contact your doctor. What can I expect after the procedure?  If you had a sample of your tissue taken out, it is common to have: Soreness and mild pain. These may last for a few days. Mild bleeding or fluid (discharge) coming from your vagina. The fluid will look dark and grainy. You may have this for a few days. The fluid may be caused by a liquid that was used during your procedure. You may need to wear a sanitary pad. Spotting of blood for at least 48 hours after the procedure. Follow these instructions at home: Medicines Take over-the-counter and prescription medicines only as told by your doctor. Ask your doctor what over-the-counter pain medicines and prescription medicines you can start taking again. This is very important if you take blood thinners. Activity For at least 24 -48 hourss, or for as long as told by your doctor, avoid: Douching. Using tampons. Having sex. Return to your normal activities as told by your doctor. Ask your doctor what activities are safe for you. General instructions  Contact a doctor if: You have a fever or chills. You faint or feel light-headed. Document Revised: 12/26/2020 Document Reviewed: 12/26/2020 Elsevier Patient Education  [place].

## 2021-09-01 NOTE — Progress Notes (Signed)
LGSIL on pap smear Hx prior abnormal pap and subsequent colposcopy  Patient given informed consent, signed copy in the chart.  Placed in lithotomy position. Cervix viewed with speculum and colposcope after application of acetic acid.   Colposcopy adequate (entire squamocolumnar junctions seen  in entirety) ?  yes Acetowhite lesions?yes at 9:00 postiion Punctation?no  Mosaicism?  no Abnormal vasculature?  no Biopsies?yes 9:00 ECC?no  Complications? no  COMMENTS: Patient was given post procedure instructions.  I will contact them with biopsy results Phone number verified and OK to leave test reults message on voice mail at that number

## 2021-09-06 ENCOUNTER — Encounter: Payer: Self-pay | Admitting: Family Medicine

## 2021-09-08 ENCOUNTER — Telehealth: Payer: Self-pay | Admitting: Family Medicine

## 2021-09-08 NOTE — Telephone Encounter (Signed)
Spoke with Beth [name] about her cervical biopsy which showed CIN-1.  I recommended Pap smear in 1 year.  She asked about getting a hysterectomy.  Said she really did not want another "cancer scare".  I explained to her that we do not do hysterectomies and that she would have to see a gynecologist.  Also tried to tell her that it is a big operation and in my opinion, it would be unlikely that hysterectomy would be optimal procedure for her.  If she wants to pursue this she needs to talk with her PCP and potentially see a gynecologist.  She expressed understanding.

## 2021-09-08 NOTE — Progress Notes (Signed)
CIN-1, recommend Pap smear in 1 year.  See phone note of today's date.

## 2021-09-09 ENCOUNTER — Other Ambulatory Visit: Payer: Self-pay | Admitting: Family Medicine

## 2021-09-09 DIAGNOSIS — R87612 Low grade squamous intraepithelial lesion on cytologic smear of cervix (LGSIL): Secondary | ICD-10-CM

## 2021-09-09 NOTE — Progress Notes (Signed)
Received patient message.  I met her during her colposcopy procedure.  At that time she indicated she want to see a gynecologist for possible elective hysterectomy.  I briefly discussed it with her when I notified her of her colposcopy results.  She now asked for referral to Dr. Hale Bogus for further discussion.  I have placed referral.

## 2021-09-19 ENCOUNTER — Other Ambulatory Visit: Payer: Self-pay

## 2021-09-19 ENCOUNTER — Ambulatory Visit (INDEPENDENT_AMBULATORY_CARE_PROVIDER_SITE_OTHER): Payer: BC Managed Care – PPO | Admitting: Family Medicine

## 2021-09-19 ENCOUNTER — Encounter: Payer: Self-pay | Admitting: Family Medicine

## 2021-09-19 VITALS — BP 120/88 | HR 91 | Ht 61.0 in | Wt 167.0 lb

## 2021-09-19 DIAGNOSIS — F419 Anxiety disorder, unspecified: Secondary | ICD-10-CM

## 2021-09-19 DIAGNOSIS — R1013 Epigastric pain: Secondary | ICD-10-CM

## 2021-09-19 DIAGNOSIS — R1110 Vomiting, unspecified: Secondary | ICD-10-CM

## 2021-09-19 DIAGNOSIS — R197 Diarrhea, unspecified: Secondary | ICD-10-CM | POA: Diagnosis not present

## 2021-09-19 DIAGNOSIS — R000 Tachycardia, unspecified: Secondary | ICD-10-CM

## 2021-09-19 DIAGNOSIS — F39 Unspecified mood [affective] disorder: Secondary | ICD-10-CM

## 2021-09-19 NOTE — Patient Instructions (Addendum)
Thank you for coming to see me today. It was a pleasure.   We will get some labs today.  If they are abnormal or we need to do something about them, I will call you.  If they are normal, I will send you a message on MyChart (if it is active) or a letter in the mail.  If you don't hear from [place] in 2 weeks, please call the office at the number below.   Please follow-up with PCP as needed  If you have any questions or concerns, please do not hesitate to call the office at [phone].  Best,   Carollee Leitz, MD    Sinus Tachycardia Sinus tachycardia is a kind of fast heartbeat. In sinus tachycardia, the heart beats more than 100 times a minute. Sinus tachycardia starts in a part of the heart called the sinus node. Sinus tachycardia may be harmless, or it may be a sign of a serious condition. What are the causes? This condition may be caused by: Exercise or exertion. A fever. Pain. Loss of body fluids (dehydration). Severe bleeding (hemorrhage). Anxiety and stress. Certain substances, including: Alcohol. Caffeine. Tobacco and nicotine products. Cold medicines. Illegal drugs. Medical conditions including: Heart disease. An infection. An overactive thyroid (hyperthyroidism). A lack of red blood cells (anemia). What are the signs or symptoms? Symptoms of this condition include: A feeling that the heart is beating quickly (palpitations). Suddenly noticing your heartbeat (cardiac awareness). Dizziness. Tiredness (fatigue). Shortness of breath. Chest pain. Nausea. Fainting. How is this diagnosed? This condition is diagnosed with: A physical exam. Other tests, such as: Blood tests. An electrocardiogram (ECG). This test measures the electrical activity of the heart. Ambulatory cardiac monitor. This records your heartbeats for 24 hours or more. You may be referred to a heart specialist (cardiologist). How is this treated? Treatment for this condition depends on the cause or the  underlying condition. Treatment may involve: Treating the underlying condition. Taking new medicines or changing your current medicines as told by your health care provider. Making changes to your diet or lifestyle. Follow these instructions at home: Lifestyle  Do not use any products that contain nicotine or tobacco, such as cigarettes and e-cigarettes. If you need help quitting, ask your health care provider. Do not use illegal drugs, such as cocaine. Learn relaxation methods to help you when you get stressed or anxious. These include deep breathing. Avoid caffeine or other stimulants. Alcohol use  Do not drink alcohol if: Your health care provider tells you not to drink. You are pregnant, may be pregnant, or are planning to become pregnant. If you drink alcohol, limit how much you have: 0-1 drink a day for women. 0-2 drinks a day for men. Be aware of how much alcohol is in your drink. In the [place] one drink equals one typical bottle of beer (12 oz), one-half glass of wine (5 oz), or one shot of hard liquor (1 oz). General instructions Drink enough fluids to keep your urine pale yellow. Take over-the-counter and prescription medicines only as told by your health care provider. Keep all follow-up visits as told by your health care provider. This is important. Contact a health care provider if you have: A fever. Vomiting or diarrhea that does not go away. Get help right away if you: Have pain in your chest, upper arms, jaw, or neck. Become weak or dizzy. Feel faint. Have palpitations that do not go away. Summary In sinus tachycardia, the heart beats more than 100 times  a minute. Sinus tachycardia may be harmless, or it may be a sign of a serious condition. Treatment for this condition depends on the cause or the underlying condition. Get help right away if you have pain in your chest, upper arms, jaw, or neck. This information is not intended to replace advice given to you by  your health care provider. Make sure you discuss any questions you have with your health care provider. Document Revised: 12/09/2020 Document Reviewed: 12/09/2020 Elsevier Patient Education  2022 [place].   Irritable Bowel Syndrome, Adult Irritable bowel syndrome (IBS) is a group of symptoms that affects the organs responsible for digestion (gastrointestinal or GI tract). IBS is not one specific disease. To regulate how the GI tract works, the body sends signals back and forth between the intestines and the brain. If you have IBS, there may be a problem with these signals. As a result, the GI tract does not function normally. The intestines may become more sensitive and overreact to certain things. This may be especially true when you eat certain foods or when you are under stress. There are four types of IBS. These may be determined based on the consistency of your stool (feces): IBS with diarrhea. IBS with constipation. Mixed IBS. Unsubtyped IBS. It is important to know which type of IBS you have. Certain treatments are more likely to be helpful for certain types of IBS. What are the causes? The exact cause of IBS is not known. What increases the risk? You may have a higher risk for IBS if you: Are female. Are younger than 66. Have a family history of IBS. Have a mental health condition, such as depression, anxiety, or post-traumatic stress disorder. Have had a bacterial infection of your GI tract. What are the signs or symptoms? Symptoms of IBS vary from person to person. The main symptom is abdominal pain or discomfort. Other symptoms usually include one or more of the following: Diarrhea, constipation, or both. Abdominal swelling or bloating. Feeling full after eating a small or regular-sized meal. Frequent gas. Mucus in the stool. A feeling of having more stool left after a bowel movement. Symptoms tend to come and go. They may be triggered by stress, mental health  conditions, or certain foods. How is this diagnosed? This condition may be diagnosed based on a physical exam, your medical history, and your symptoms. You may have tests, such as: Blood tests. Stool test. X-rays. CT scan. Colonoscopy. This is a procedure in which your GI tract is viewed with a long, thin, flexible tube. How is this treated? There is no cure for IBS, but treatment can help relieve symptoms. Treatment depends on the type of IBS you have, and may include: Changes to your diet, such as: Avoiding foods that cause symptoms. Drinking more water. Following a low-FODMAP (fermentable oligosaccharides, disaccharides, monosaccharides, and polyols) diet for up to 6 weeks, or as told by your health care provider. FODMAPs are sugars that are hard for some people to digest. Eating more fiber. Eating medium-sized meals at the same times every day. Medicines. These may include: Fiber supplements, if you have constipation. Medicine to control diarrhea (antidiarrheal medicines). Medicine to help control muscle tightening (spasms) in your GI tract (antispasmodic medicines). Medicines to help with mental health conditions, such as antidepressants or tranquilizers. Talk therapy or counseling. Working with a diet and nutrition specialist (dietitian) to help create a food plan that is right for you. Managing your stress. Follow these instructions at home: Eating and drinking  Eat a healthy diet. Eat medium-sized meals at about the same time every day. Do not eat large meals. Gradually eat more fiber-rich foods. These include whole grains, fruits, and vegetables. This may be especially helpful if you have IBS with constipation. Eat a diet low in FODMAPs. Drink enough fluid to keep your urine pale yellow. Keep a journal of foods that seem to trigger symptoms. Avoid foods and drinks that: Contain added sugar. Make your symptoms worse. Dairy products, caffeinated drinks, and carbonated drinks  can make symptoms worse for some people. General instructions Take over-the-counter and prescription medicines and supplements only as told by your health care provider. Get enough exercise. Do at least 150 minutes of moderate-intensity exercise each week. Manage your stress. Getting enough sleep and exercise can help you manage stress. Keep all follow-up visits as told by your health care provider and therapist. This is important. Alcohol Use Do not drink alcohol if: Your health care provider tells you not to drink. You are pregnant, may be pregnant, or are planning to become pregnant. If you drink alcohol, limit how much you have: 0-1 drink a day for women. 0-2 drinks a day for men. Be aware of how much alcohol is in your drink. In the [place] one drink equals one typical bottle of beer (12 oz), one-half glass of wine (5 oz), or one shot of hard liquor (1 oz). Contact a health care provider if you have: Constant pain. Weight loss. Difficulty or pain when swallowing. Diarrhea that gets worse. Get help right away if you have: Severe abdominal pain. Fever. Diarrhea with symptoms of dehydration, such as dizziness or dry mouth. Bright red blood in your stool. Stool that is black and tarry. Abdominal swelling. Vomiting that does not stop. Blood in your vomit. Summary Irritable bowel syndrome (IBS) is not one specific disease. It is a group of symptoms that affects digestion. Your intestines may become more sensitive and overreact to certain things. This may be especially true when you eat certain foods or when you are under stress. There is no cure for IBS, but treatment can help relieve symptoms. This information is not intended to replace advice given to you by your health care provider. Make sure you discuss any questions you have with your health care provider. Document Revised: 03/25/2020 Document Reviewed: 04/01/2020 Elsevier Patient Education  2022 [employer].

## 2021-09-19 NOTE — Progress Notes (Signed)
° ° °  SUBJECTIVE:   CHIEF COMPLAINT / HPI: fast heart rate, diarrhea and vomiting  Patient reports NBNB vomiting episodes for 2 years.  Was seen by GI last June and EGD normal.  Reports intermittent belching, feels like heart burn.  Recent weight loss of 4lbs since last visit.  Appetite ok and hydrating well.  LMP recent. Has Mirena in place.  Also reports 20 episodes of NB diarrhea daily.  Diarrhea has been ongoing for years but recently increased.  Taking Immodium with some relief.  Denies any fevers, recent travel, sick contacts.    Tachycardia Concern for intermittent fast heart beat.  Has been checking on watch and over 100.  Denies any shortness of breath, chest pain or diaphoresis.  Reports that family member had heart attack and she was worried that this may happen to her.  She reports she has been stressed a little over this lately.    Mood disorder Positive answer to question 9.  This has remained unchanged from previous visits.  Has support from partner and discussed concerns with him.  Also has cats that help her stress.  No active plan, no access to firearms.  Has been able to follow with psychiatry in [place] and reports has been helpful.  Recently started on Ativan 1 mg prn, Lamictal 200 mg daily (titrating up).  Medications have improved mood.    PERTINENT  PMH / PSH:  MDD Anxiety Polysubstance use   OBJECTIVE:   BP 120/88    Pulse 91    Ht 5\' 1"  (1.549 m)    Wt 167 lb (75.8 kg)    SpO2 100%    BMI 31.55 kg/m    General: Alert, no acute distress Cardio: Normal S1 and S2, RRR, no r/m/g, no JVD, cap refill wnl Pulm: CTAB, normal work of breathing Abdomen: Bowel sounds normal. Abdomen soft and non-tender.  Negative rebound, Negative Murphy's, Negative Carnett's, Negative Rovsing, Negative McBurney    ASSESSMENT/PLAN:   Vomiting and diarrhea Chronic.  NBNB emesis NB stool.  Recent EGD and Colonoscopy negative.  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  Benign abdominal  exam. -CMet, TSH, CBC -Discussed nutrition. -Increase fiber and water intake -Strict return precautions provided -Follow up in 4 weeks or sooner if symptoms do not improve  Tachycardia Sinus rhythm today.  Likely mood disorder contributing to anxiety.  [facility]S exam benign.   -TSH, CMet, CBC  -Follow up with labs -Strict return precautions -Continue therapy -Follow up as needed  Mood disorder [facility]) Has now established mental health provider in [facility].  Reports prescribed Ativan 1 mg as needed, recently started on Lamictal 200 mg daily and continues with Trazadone 50 mg at night.  PHQ9 17 -Continue to follow up with [facility] as scheduled -Mental health provider to refill Lamictal and Ativan. -PDMP reviewed -She has access to 24 hr Crisis line -Will now follow peripherally or as needed      [facility], MD [facility]

## 2021-09-20 LAB — CBC WITH DIFFERENTIAL/PLATELET
Basophils Absolute: 0.1 10*3/uL (ref 0.0–0.2)
Basos: 1 %
EOS (ABSOLUTE): 0.1 10*3/uL (ref 0.0–0.4)
Eos: 1 %
Hematocrit: 38.2 % (ref 34.0–46.6)
Hemoglobin: 13.5 g/dL (ref 11.1–15.9)
Immature Grans (Abs): 0 10*3/uL (ref 0.0–0.1)
Immature Granulocytes: 0 %
Lymphocytes Absolute: 1.4 10*3/uL (ref 0.7–3.1)
Lymphs: 15 %
MCH: 29.7 pg (ref 26.6–33.0)
MCHC: 35.3 g/dL (ref 31.5–35.7)
MCV: 84 fL (ref 79–97)
Monocytes Absolute: 0.6 10*3/uL (ref 0.1–0.9)
Monocytes: 7 %
Neutrophils Absolute: 6.8 10*3/uL (ref 1.4–7.0)
Neutrophils: 76 %
Platelets: 419 10*3/uL (ref 150–450)
RBC: 4.54 x10E6/uL (ref 3.77–5.28)
RDW: 13.5 % (ref 11.7–15.4)
WBC: 9 10*3/uL (ref 3.4–10.8)

## 2021-09-20 LAB — COMPREHENSIVE METABOLIC PANEL
ALT: 23 IU/L (ref 0–32)
AST: 20 IU/L (ref 0–40)
Albumin/Globulin Ratio: 1.8 (ref 1.2–2.2)
Albumin: 4.9 g/dL — ABNORMAL HIGH (ref 3.8–4.8)
Alkaline Phosphatase: 99 IU/L (ref 44–121)
BUN/Creatinine Ratio: 9 (ref 9–23)
BUN: 7 mg/dL (ref 6–20)
Bilirubin Total: 1.5 mg/dL — ABNORMAL HIGH (ref 0.0–1.2)
CO2: 17 mmol/L — ABNORMAL LOW (ref 20–29)
Calcium: 10.2 mg/dL (ref 8.7–10.2)
Chloride: 101 mmol/L (ref 96–106)
Creatinine, Ser: 0.77 mg/dL (ref 0.57–1.00)
Globulin, Total: 2.7 g/dL (ref 1.5–4.5)
Glucose: 102 mg/dL — ABNORMAL HIGH (ref 70–99)
Potassium: 4.3 mmol/L (ref 3.5–5.2)
Sodium: 135 mmol/L (ref 134–144)
Total Protein: 7.6 g/dL (ref 6.0–8.5)
eGFR: 105 mL/min/{1.73_m2} (ref >59)

## 2021-09-20 LAB — TSH: TSH: 0.615 u[IU]/mL (ref 0.450–4.500)

## 2021-09-21 ENCOUNTER — Encounter: Payer: Self-pay | Admitting: Family Medicine

## 2021-09-21 DIAGNOSIS — R197 Diarrhea, unspecified: Secondary | ICD-10-CM | POA: Insufficient documentation

## 2021-09-21 DIAGNOSIS — R1110 Vomiting, unspecified: Secondary | ICD-10-CM | POA: Insufficient documentation

## 2021-09-21 NOTE — Assessment & Plan Note (Signed)
Sinus rhythm today.  Likely mood disorder contributing to anxiety.  CVS exam benign.   -TSH, CMet, CBC  -Follow up with labs -Strict return precautions -Continue therapy -Follow up as needed

## 2021-09-21 NOTE — Assessment & Plan Note (Addendum)
Chronic.  NBNB emesis NB stool.  Recent EGD and Colonoscopy negative.  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  Benign abdominal exam. -CMet, TSH, CBC -Discussed nutrition. -Increase fiber and water intake -Strict return precautions provided -Follow up in 4 weeks or sooner if symptoms do not improve

## 2021-09-21 NOTE — Assessment & Plan Note (Addendum)
Has now established mental health provider in [place].  Reports prescribed Ativan 1 mg as needed, recently started on Lamictal 200 mg daily and continues with Trazadone 50 mg at night.  PHQ9 17 -Continue to follow up with [facility] as scheduled -Mental health provider to refill Lamictal and Ativan. -PDMP reviewed -She has access to 24 hr Crisis line -Will now follow peripherally or as needed

## 2021-09-30 ENCOUNTER — Encounter: Payer: Self-pay | Admitting: Family Medicine

## 2021-10-03 ENCOUNTER — Other Ambulatory Visit: Payer: Self-pay | Admitting: Family Medicine

## 2021-10-03 DIAGNOSIS — R17 Unspecified jaundice: Secondary | ICD-10-CM

## 2021-10-17 ENCOUNTER — Encounter (HOSPITAL_BASED_OUTPATIENT_CLINIC_OR_DEPARTMENT_OTHER): Payer: Self-pay | Admitting: Obstetrics & Gynecology

## 2021-10-17 ENCOUNTER — Ambulatory Visit (INDEPENDENT_AMBULATORY_CARE_PROVIDER_SITE_OTHER): Payer: No Typology Code available for payment source | Admitting: Obstetrics & Gynecology

## 2021-10-17 ENCOUNTER — Other Ambulatory Visit: Payer: Self-pay

## 2021-10-17 VITALS — BP 115/84 | HR 92 | Ht 62.0 in | Wt 168.2 lb

## 2021-10-17 DIAGNOSIS — R87612 Low grade squamous intraepithelial lesion on cytologic smear of cervix (LGSIL): Secondary | ICD-10-CM

## 2021-10-17 DIAGNOSIS — B977 Papillomavirus as the cause of diseases classified elsewhere: Secondary | ICD-10-CM
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
## 2021-10-25 ENCOUNTER — Other Ambulatory Visit: Payer: Self-pay

## 2021-10-25 ENCOUNTER — Encounter: Payer: Self-pay | Admitting: Family Medicine

## 2021-10-25 ENCOUNTER — Ambulatory Visit (INDEPENDENT_AMBULATORY_CARE_PROVIDER_SITE_OTHER): Payer: BC Managed Care – PPO | Admitting: Family Medicine

## 2021-10-25 VITALS — BP 108/64 | HR 103 | Wt 169.6 lb

## 2021-10-25 DIAGNOSIS — R1110 Vomiting, unspecified: Secondary | ICD-10-CM

## 2021-10-25 DIAGNOSIS — R197 Diarrhea, unspecified: Secondary | ICD-10-CM

## 2021-10-25 DIAGNOSIS — Z79899 Other long term (current) drug therapy: Secondary | ICD-10-CM | POA: Diagnosis not present

## 2021-10-25 MED ORDER — LOPERAMIDE HCL 2 MG PO CAPS: 2.0000 mg | ORAL_CAPSULE | Freq: Three times a day (TID) | ORAL | 3 refills | Status: DC

## 2021-10-25 NOTE — Progress Notes (Deleted)
Some days doesn't vomit ?Some days, can't get out of the bathroom, vomits several times in a day ?Diarrhea 4-5 episodes / day on average ?No blood in stool or vomit most times ?Known hemorrhoids, seeing small speck of blood on paper maybe once every 3 weeks ? ?Tried pantoprazole - not taking any more, couldn't tell effect ?Glycopyrrolate - no effect really ?Zofran - helps sometimes, not all the time (now up to 8 mg), takes average of 5/7 days ? ?Now seeing therapist and psychiatrist, anxiety is better, but still having  ? ?Smokes cigarettes 1/3 PPD, occasional THC ? ?Meds:  ?Lamictal 200 mg now ?Ativan occasional, last use last night, use 4/7 days ?Trazodone  - takes most nights ?Cannabinoids, almost every day, has THC in it: has been about one year, using it to cut down on ETOH ?ETOH none in 2 years ? ?No new meds, none that aren't on list ? ?To do:  ?- EKG ?

## 2021-10-25 NOTE — Patient Instructions (Addendum)
It was wonderful to meet you today. Thank you for allowing me to be a part of your care. Below is a short summary of what we discussed at your visit today: ? ?Lab abnormality  ?Today we collected visional blood work to retest your hepatic function panel.  Last time, your lab work showed that your bilirubin was a little bit high.  We will recheck this. If the results are normal, I will send you a letter or MyChart message. If the results are abnormal, I will give you a call.   ? ?Diarrhea ?Take the Imodium 3 times daily before meals, about 45 minutes before each meal. ? ?Today we got an EKG to make sure that your heart is doing well with all of the Zofran you are taking. ? ?Please call your GI doctor and try to get back in to talk about different medication approaches.  I will take several months to get back in, so in the meantime we can try the Imodium trial. ? ?Do a milk elimination trial to see if this is making your diarrhea worse. ? ?Cooking and Nutrition Classes ?[employer] in [place] provides many classes at low or no cost to [employer], nutrition, and agriculture.  Their website offers a huge variety of information related to topics such as gardening, nutrition, cooking, parenting, and health.  Also listed are classes and events, both online and in-person.  Check out their website here: [email or web address]  ? ? ? ?Please bring all of your medications to every appointment! ? ?If you have any questions or concerns, please do not hesitate to contact us via phone or MyChart message.  ? ?Ezequiel Essex, MD  ?

## 2021-10-25 NOTE — Progress Notes (Signed)
? ? ?SUBJECTIVE:  ? ?CHIEF COMPLAINT / HPI:  ? ?Chronic nausea, vomiting, diarrhea with recent abnormal bilirubin lab ? ?Patient presents for follow up and repeat labs. She reports no changes to chronic nausea, vomiting, and diarrhea. Continues to have diarrhea 4-5 times daily. Nauseous every day, but vomits approx 3-4 times weekly. Using zofran 5 days a week, last EKG 2021. Last appointment with GI June 2022.  ? ?No blood in stool or vomit most times ?Known hemorrhoids, seeing small speck of blood on paper maybe once every 3 weeks ? ?Tried pantoprazole - not taking any more, couldn't tell effect ?Glycopyrrolate - no effect really ?Zofran - helps sometimes, not all the time (now up to 8 mg), takes average of 5/7 days ? ?Now seeing therapist and psychiatrist, anxiety is better, but still having nausea and diarrhea ? ?Smokes cigarettes 1/3 PPD, occasional THC ? ?Meds:  ?Ativan occasional, last use last night, use 4/7 days ?Trazodone  - takes most nights ?Cannabinoids, almost every day, has THC in it: has been about one year, using it to cut down on ETOH ?ETOH none in 2 years ? ?Brief chart review below. ?April 2022: negative HIV, Hep C; normal lipase (38) ?May 2022: TTG Ab normal/negative for celiac, sed rate 43, CRP normal (1.1), negative stool ova/parasite study, negative stool lactoferrin lab ?01/25/2021: Last saw GI  ?02/21/2021: Normal liver and GB on CT abd  ?09/19/21: Last [facility] appointment, saw Dr. Clent Ridges, recommended increasing fiber and water, collected CMP, TSH, CBC, instructed to follow up in 4 weeks ? - CMP demonstrated total bilirubin 1.5, otherwise unremarkable ? - CBC, TSH within normal limits ? ?PERTINENT  PMH / PSH:  ?Patient Active Problem List  ? Diagnosis Date Noted  ? Vomiting and diarrhea 09/21/2021  ? 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 Grief 04/13/2021  ? Mood disorder (HCC) 04/12/2021  ? Insomnia 03/07/2021  ? Vaginal discharge 11/15/2020  ? Alcohol use disorder, severe, dependence (HCC)  12/02/2019  ? Suicidal ideation   ? Anxiety 11/10/2019  ? Mild concussion 04/20/2019  ? MDD (major depressive disorder), recurrent episode, severe (HCC) 12/19/2018  ? Dyspepsia 09/02/2018  ? Tachycardia 01/11/2018  ? Tobacco use disorder 01/11/2018  ? Abnormal Pap smear of cervix 10/01/2017  ? Post traumatic stress disorder (PTSD) 06/14/2016  ? MDD (major depressive disorder), recurrent, severe, with psychosis (HCC) 06/05/2016  ?  ? ?OBJECTIVE:  ? ?BP 108/64   Pulse (!) 103   Wt 169 lb 9.6 oz (76.9 kg)   SpO2 99%   BMI 31.02 kg/m?   ? ?PHQ-9:  ?Depression screen [facility] 2/9 10/25/2021 10/17/2021 09/01/2021  ?Decreased Interest 1 0 1  ?Down, Depressed, Hopeless 2 0 2  ?PHQ - 2 Score 3 0 3  ?Altered sleeping 1 - 2  ?Tired, decreased energy 3 - 2  ?Change in appetite 3 - 2  ?Feeling bad or failure about yourself  2 - 1  ?Trouble concentrating 3 - 2  ?Moving slowly or fidgety/restless 2 - 2  ?Suicidal thoughts 0 - 0  ?PHQ-9 Score 17 - 14  ?Difficult doing work/chores Very difficult - -  ?Some recent data might be hidden  ?  ?GAD-7:  ?GAD 7 : Generalized Anxiety Score 05/03/2021 11/07/2019 09/26/2017  ?Nervous, Anxious, on Edge 3 2 3   ?Control/stop worrying 3 2 2   ?Worry too much - different things 3 2 2   ?Trouble relaxing 3 2 2   ?Restless 3 2 2   ?Easily annoyed or irritable 3  2 3  ?Afraid - awful might happen 3 2 3   ?Total GAD 7 Score 21 14 17   ?Anxiety Difficulty Extremely difficult Somewhat difficult -  ?Some encounter information is confidential and restricted. Go to Review Flowsheets activity to see all data.  ? ?Physical Exam ?General: Awake, alert, oriented ?HEENT: sclera anicteric, nasal mucosa slightly edematous, oral mucosa pink, moist, without lesion, intact dentition without obvious cavity ?Lymph: No palpable lymphedema of head or neck ?Cardiovascular: Regular rate and rhythm, S1 and S2 present, no murmurs auscultated ?Respiratory: Lung fields clear to auscultation bilaterally ? ?ASSESSMENT/PLAN:  ? ?Vomiting and  diarrhea ?Chronic, stable. Will repeat LFT lab today. 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 Also considering cyclical vomiting from daily THC use in form of gummies. No current anti-diarrheal medications. Will start imodium TID before meals. EKG today shows normal QTc, okay to continue zofran use. Recommend follow up with GI, may need functional study to rule out gastroparesis, although less likely the cause of copious diarrhea. ?  ? ? , MD ?[facility]  ?

## 2021-10-26 NOTE — Assessment & Plan Note (Addendum)
Chronic, stable. Will repeat LFT lab today. 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 Also considering cyclical vomiting from daily THC use in form of gummies. No current anti-diarrheal medications. Will start imodium TID before meals. EKG today shows normal QTc, okay to continue zofran use. Recommend follow up with GI, may need functional study to rule out gastroparesis, although less likely the cause of copious diarrhea. ?

## 2021-10-31 ENCOUNTER — Other Ambulatory Visit: Payer: BC Managed Care – PPO

## 2021-10-31 ENCOUNTER — Other Ambulatory Visit: Payer: Self-pay

## 2021-10-31 DIAGNOSIS — R17 Unspecified jaundice: Secondary | ICD-10-CM

## 2021-11-01 LAB — HEPATIC FUNCTION PANEL
ALT: 18 IU/L (ref 0–32)
AST: 18 IU/L (ref 0–40)
Albumin: 4.3 g/dL (ref 3.8–4.8)
Alkaline Phosphatase: 92 IU/L (ref 44–121)
Bilirubin Total: 0.6 mg/dL (ref 0.0–1.2)
Bilirubin, Direct: 0.19 mg/dL (ref 0.00–0.40)
Total Protein: 6.6 g/dL (ref 6.0–8.5)

## 2021-11-30 ENCOUNTER — Encounter (HOSPITAL_BASED_OUTPATIENT_CLINIC_OR_DEPARTMENT_OTHER): Payer: Self-pay | Admitting: Obstetrics & Gynecology

## 2022-01-17 ENCOUNTER — Encounter: Payer: Self-pay | Admitting: *Deleted

## 2022-02-06 ENCOUNTER — Telehealth (INDEPENDENT_AMBULATORY_CARE_PROVIDER_SITE_OTHER): Payer: No Typology Code available for payment source | Admitting: Obstetrics & Gynecology

## 2022-02-06 ENCOUNTER — Encounter (HOSPITAL_BASED_OUTPATIENT_CLINIC_OR_DEPARTMENT_OTHER): Payer: Self-pay | Admitting: Obstetrics & Gynecology

## 2022-02-06 DIAGNOSIS — Z8742 Personal history of other diseases of the female genital tract: Secondary | ICD-10-CM

## 2022-02-06 DIAGNOSIS — N870 Mild cervical dysplasia: Secondary | ICD-10-CM

## 2022-02-06 DIAGNOSIS — Z302 Encounter for sterilization: Secondary | ICD-10-CM | POA: Diagnosis not present

## 2022-02-06 NOTE — Progress Notes (Signed)
Virtual Visit via Video Note  I connected with Beth [name] on 02/06/22 at 11:45 AM EDT by a video enabled telemedicine application and verified that I am speaking with the correct person using two identifiers.  Location: Patient: home Provider: office   I discussed the limitations of evaluation and management by telemedicine and the availability of in person appointments. The patient expressed understanding and agreed to proceed.  History of Present Illness: 31 yo G0 interested in tubal ligation/sterilization procedure for pregnancy prevention.  Has never been pregnancy but has no desire for pregnancy.  Has asked questions previously about hysterectomy as well.  Differences in procedures discussed.  Added benefits with salpingectomy discussed.  Procedure discussed with patient.  Hospital stay, recovery and pain management all discussed.  Risks discussed including but not limited to bleeding, <1% risk of receiving a  transfusion, infection, <1% risk of bowel/bladder/ureteral/vascular injury discussed as well as possible need for additional surgery if injury does occur discussed.  DVT/PE and rare risk of death discussed.  Positioning and incision locations discussed.  Patient aware if pathology abnormal she may need additional treatment.  All questions answered.  Pt does smoke intermittently.  Has IUD and has been very happy with this from standpoint of bleeding control.  Has used for menorrhagia.  About time for removal and replacement.  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  We have discussed treatment options in the past.  Last pap was 08/2021.  Could consider repeating pap prior to surgery and if LGSIL findings still present, could proceed with LEEP in OR as the same time.  Pt is interested in this as well.  Observations/Objective: WNWD, NAD  Assessment and Plan: 1. Encounter for sterilization - surgical planning will be initiated.  Pt does not want this done  until later august at the earliest due to summer beach trip  2. Dysplasia of cervix, low grade (CIN 1) - consider pre procedure pap and LEEP in OR if same  3.  History of menorrhagia - pt would like IUD removed and replaced in OR if possible was placed 09/09/2015.  Follow Up Instructions: I discussed the assessment and treatment plan with the patient. The patient was provided an opportunity to ask questions and all were answered. The patient agreed with the plan and demonstrated an understanding of the instructions.   The patient was advised to call back or seek an in-person evaluation if the symptoms worsen or if the condition fails to improve as anticipated.  I provided 32 minutes of non-face-to-face time during this encounter.   Jerene Bears, MD

## 2022-02-09 ENCOUNTER — Encounter (HOSPITAL_BASED_OUTPATIENT_CLINIC_OR_DEPARTMENT_OTHER): Payer: Self-pay | Admitting: Obstetrics & Gynecology

## 2022-02-20 ENCOUNTER — Encounter (HOSPITAL_BASED_OUTPATIENT_CLINIC_OR_DEPARTMENT_OTHER): Payer: Self-pay | Admitting: Obstetrics & Gynecology

## 2022-02-27 ENCOUNTER — Telehealth (HOSPITAL_BASED_OUTPATIENT_CLINIC_OR_DEPARTMENT_OTHER): Payer: Self-pay | Admitting: Obstetrics & Gynecology

## 2022-02-27 NOTE — Telephone Encounter (Signed)
Patient called and would like be schedule for surgery .

## 2022-02-28 ENCOUNTER — Encounter (HOSPITAL_BASED_OUTPATIENT_CLINIC_OR_DEPARTMENT_OTHER): Payer: Self-pay

## 2022-02-28 ENCOUNTER — Telehealth: Payer: Self-pay

## 2022-02-28 NOTE — Telephone Encounter (Signed)
Called and spoke to patient, confirmed days of her beach trip, opted for 09/05 surgery date.

## 2022-03-16 ENCOUNTER — Encounter (HOSPITAL_BASED_OUTPATIENT_CLINIC_OR_DEPARTMENT_OTHER): Payer: Self-pay | Admitting: *Deleted

## 2022-04-03 ENCOUNTER — Encounter (HOSPITAL_BASED_OUTPATIENT_CLINIC_OR_DEPARTMENT_OTHER): Payer: Self-pay | Admitting: Obstetrics & Gynecology

## 2022-04-03 ENCOUNTER — Other Ambulatory Visit: Payer: Self-pay

## 2022-04-03 NOTE — Progress Notes (Signed)
Spoke w/ via phone for pre-op interview---Beth [name] needs dos----  urine pregnancy per anesthesia, surgeon orders pending as of 04/03/22             [name] results------none COVID test -----patient states asymptomatic no test needed Arrive at ------0530 on Tuesday, 04/18/2022 NPO after MN NO Solid Food.  Clear liquids from MN until---0430 Med rec completed Medications to take morning of surgery -----Lamictal, Abilify, Depakote, Gabapentin, Ativan prn, Zofran prn, Imodium prn Diabetic medication -----n/a Patient instructed no nail polish to be worn day of surgery Patient instructed to bring photo id and insurance card day of surgery Patient aware to have Driver (ride ) / caregiver    for 24 hours after surgery - boyfriend, Duwayne Heck and mom Patient Special Instructions -----Do not take cannabinoids or smoke marijuana for 24 hours before surgery. Do your best not to smoke cigarettes for 24 hours before surgery.  Pre-Op special Istructions -----Requested orders from Dr. Hyacinth Meeker via Epic IB on 04/03/22. Patient verbalized understanding of instructions that were given at this phone interview. Patient denies shortness of breath, chest pain, fever, cough at this phone interview.

## 2022-04-14 ENCOUNTER — Encounter (HOSPITAL_BASED_OUTPATIENT_CLINIC_OR_DEPARTMENT_OTHER): Payer: Self-pay | Admitting: Obstetrics & Gynecology

## 2022-04-14 ENCOUNTER — Other Ambulatory Visit (HOSPITAL_BASED_OUTPATIENT_CLINIC_OR_DEPARTMENT_OTHER): Payer: Self-pay | Admitting: Obstetrics & Gynecology

## 2022-04-14 ENCOUNTER — Ambulatory Visit (INDEPENDENT_AMBULATORY_CARE_PROVIDER_SITE_OTHER): Payer: No Typology Code available for payment source | Admitting: Obstetrics & Gynecology

## 2022-04-14 VITALS — BP 112/74 | HR 84 | Ht 61.0 in | Wt 171.4 lb

## 2022-04-14 DIAGNOSIS — R87612 Low grade squamous intraepithelial lesion on cytologic smear of cervix (LGSIL): Secondary | ICD-10-CM | POA: Diagnosis not present

## 2022-04-14 DIAGNOSIS — Z8742 Personal history of other diseases of the female genital tract: Secondary | ICD-10-CM

## 2022-04-14 DIAGNOSIS — B977 Papillomavirus as the cause of diseases classified elsewhere: Secondary | ICD-10-CM

## 2022-04-14 DIAGNOSIS — Z3009 Encounter for other general counseling and advice on contraception: Secondary | ICD-10-CM | POA: Diagnosis not present

## 2022-04-14 DIAGNOSIS — Z01818 Encounter for other preprocedural examination: Secondary | ICD-10-CM

## 2022-04-18 ENCOUNTER — Encounter (HOSPITAL_BASED_OUTPATIENT_CLINIC_OR_DEPARTMENT_OTHER): Payer: Self-pay | Admitting: Obstetrics & Gynecology

## 2022-04-18 ENCOUNTER — Other Ambulatory Visit: Payer: Self-pay

## 2022-04-18 ENCOUNTER — Ambulatory Visit (HOSPITAL_BASED_OUTPATIENT_CLINIC_OR_DEPARTMENT_OTHER)
Admission: RE | Admit: 2022-04-18 | Discharge: 2022-04-18 | Disposition: A | Discharge status: Home / Self Care | Payer: No Typology Code available for payment source | Source: Ambulatory Visit | Attending: Obstetrics & Gynecology | Admitting: Obstetrics & Gynecology

## 2022-04-18 ENCOUNTER — Ambulatory Visit (HOSPITAL_BASED_OUTPATIENT_CLINIC_OR_DEPARTMENT_OTHER): Payer: No Typology Code available for payment source | Admitting: Anesthesiology

## 2022-04-18 ENCOUNTER — Encounter (HOSPITAL_BASED_OUTPATIENT_CLINIC_OR_DEPARTMENT_OTHER)
Admission: RE | Disposition: A | Discharge status: Home / Self Care | Payer: Self-pay | Source: Ambulatory Visit | Attending: Obstetrics & Gynecology

## 2022-04-18 DIAGNOSIS — N920 Excessive and frequent menstruation with regular cycle: Secondary | ICD-10-CM | POA: Diagnosis not present

## 2022-04-18 DIAGNOSIS — Z01818 Encounter for other preprocedural examination: Secondary | ICD-10-CM

## 2022-04-18 DIAGNOSIS — Z30430 Encounter for insertion of intrauterine contraceptive device: Secondary | ICD-10-CM | POA: Insufficient documentation

## 2022-04-18 DIAGNOSIS — Z302 Encounter for sterilization: Secondary | ICD-10-CM

## 2022-04-18 DIAGNOSIS — N870 Mild cervical dysplasia: Secondary | ICD-10-CM | POA: Diagnosis not present

## 2022-04-18 DIAGNOSIS — Z30432 Encounter for removal of intrauterine contraceptive device: Secondary | ICD-10-CM | POA: Diagnosis not present

## 2022-04-18 DIAGNOSIS — F17210 Nicotine dependence, cigarettes, uncomplicated: Secondary | ICD-10-CM | POA: Diagnosis not present

## 2022-04-18 DIAGNOSIS — F319 Bipolar disorder, unspecified: Secondary | ICD-10-CM | POA: Insufficient documentation

## 2022-04-18 DIAGNOSIS — B977 Papillomavirus as the cause of diseases classified elsewhere: Secondary | ICD-10-CM | POA: Diagnosis not present

## 2022-04-18 HISTORY — PX: INTRAUTERINE DEVICE (IUD) INSERTION: SHX5877

## 2022-04-18 HISTORY — DX: Tachycardia, unspecified: R00.0

## 2022-04-18 HISTORY — PX: LEEP: SHX91

## 2022-04-18 HISTORY — DX: Suicidal ideations: R45.851

## 2022-04-18 HISTORY — PX: IUD REMOVAL: SHX5392

## 2022-04-18 HISTORY — DX: Anemia, unspecified: D64.9

## 2022-04-18 HISTORY — DX: Gastro-esophageal reflux disease without esophagitis: K21.9

## 2022-04-18 HISTORY — DX: Bipolar disorder, unspecified: F31.9

## 2022-04-18 HISTORY — DX: Presence of spectacles and contact lenses: Z97.3

## 2022-04-18 HISTORY — PX: LAPAROSCOPIC BILATERAL SALPINGECTOMY: SHX5889

## 2022-04-18 LAB — CBC
HCT: 41.1 % (ref 36.0–46.0)
Hemoglobin: 13.8 g/dL (ref 12.0–15.0)
MCH: 31.3 pg (ref 26.0–34.0)
MCHC: 33.6 g/dL (ref 30.0–36.0)
MCV: 93.2 fL (ref 80.0–100.0)
Platelets: 312 10*3/uL (ref 150–400)
RBC: 4.41 MIL/uL (ref 3.87–5.11)
RDW: 13.5 % (ref 11.5–15.5)
WBC: 9.5 10*3/uL (ref 4.0–10.5)
nRBC: 0 % (ref 0.0–0.2)

## 2022-04-18 LAB — POCT PREGNANCY, URINE: Preg Test, Ur: NEGATIVE

## 2022-04-18 SURGERY — SALPINGECTOMY, BILATERAL, LAPAROSCOPIC
Anesthesia: General | Site: Uterus

## 2022-04-18 MED ORDER — ACETAMINOPHEN 10 MG/ML IV SOLN: 1000.0000 mg | Freq: Once | INTRAVENOUS | Status: DC | PRN

## 2022-04-18 MED ORDER — KETOROLAC TROMETHAMINE 30 MG/ML IJ SOLN
INTRAMUSCULAR | Status: DC | PRN
  Administered 2022-04-18: 30 mg via INTRAVENOUS

## 2022-04-18 MED ORDER — BUPIVACAINE HCL 0.25 % IJ SOLN
INTRAMUSCULAR | Status: DC | PRN
  Administered 2022-04-18: 9 mL

## 2022-04-18 MED ORDER — OXYCODONE HCL 5 MG PO TABS: 5.0000 mg | ORAL_TABLET | Freq: Once | ORAL | Status: DC | PRN

## 2022-04-18 MED ORDER — PROPOFOL 10 MG/ML IV BOLUS
INTRAVENOUS | Status: AC
  Filled 2022-04-18: qty 20

## 2022-04-18 MED ORDER — AMISULPRIDE (ANTIEMETIC) 5 MG/2ML IV SOLN: 10.0000 mg | Freq: Once | INTRAVENOUS | Status: DC | PRN

## 2022-04-18 MED ORDER — ACETAMINOPHEN 160 MG/5ML PO SOLN: 325.0000 mg | ORAL | Status: DC | PRN

## 2022-04-18 MED ORDER — LEVONORGESTREL 20 MCG/DAY IU IUD
1.0000 | INTRAUTERINE_SYSTEM | INTRAUTERINE | Status: AC
  Administered 2022-04-18: 1 via INTRAUTERINE

## 2022-04-18 MED ORDER — ACETAMINOPHEN 325 MG PO TABS: 325.0000 mg | ORAL_TABLET | ORAL | Status: DC | PRN

## 2022-04-18 MED ORDER — IBUPROFEN 800 MG PO TABS: 800.0000 mg | ORAL_TABLET | Freq: Three times a day (TID) | ORAL | 0 refills | Status: DC | PRN

## 2022-04-18 MED ORDER — ROCURONIUM BROMIDE 100 MG/10ML IV SOLN
INTRAVENOUS | Status: DC | PRN
  Administered 2022-04-18: 60 mg via INTRAVENOUS

## 2022-04-18 MED ORDER — DEXAMETHASONE SODIUM PHOSPHATE 4 MG/ML IJ SOLN
INTRAMUSCULAR | Status: DC | PRN
  Administered 2022-04-18: 8 mg via INTRAVENOUS

## 2022-04-18 MED ORDER — FERRIC SUBSULFATE (BULK) SOLN
Status: DC | PRN
  Administered 2022-04-18: 1

## 2022-04-18 MED ORDER — LIDOCAINE-EPINEPHRINE (PF) 1 %-1:200000 IJ SOLN
INTRAMUSCULAR | Status: DC | PRN
  Administered 2022-04-18: 6 mL

## 2022-04-18 MED ORDER — LACTATED RINGERS IV SOLN: INTRAVENOUS | Status: DC

## 2022-04-18 MED ORDER — GLYCOPYRROLATE 0.2 MG/ML IJ SOLN
INTRAMUSCULAR | Status: DC | PRN
  Administered 2022-04-18: .1 mg via INTRAVENOUS

## 2022-04-18 MED ORDER — HYDROCODONE-ACETAMINOPHEN 5-325 MG PO TABS: 1.0000 | ORAL_TABLET | Freq: Four times a day (QID) | ORAL | 0 refills | Status: DC | PRN

## 2022-04-18 MED ORDER — PHENYLEPHRINE HCL (PRESSORS) 10 MG/ML IV SOLN
INTRAVENOUS | Status: DC | PRN
  Administered 2022-04-18: 80 ug via INTRAVENOUS

## 2022-04-18 MED ORDER — DEXMEDETOMIDINE (PRECEDEX) IN NS 20 MCG/5ML (4 MCG/ML) IV SYRINGE
PREFILLED_SYRINGE | INTRAVENOUS | Status: DC | PRN
  Administered 2022-04-18: 4 ug via INTRAVENOUS

## 2022-04-18 MED ORDER — FENTANYL CITRATE (PF) 100 MCG/2ML IJ SOLN: 25.0000 ug | INTRAMUSCULAR | Status: DC | PRN

## 2022-04-18 MED ORDER — LEVONORGESTREL 20 MCG/DAY IU IUD
INTRAUTERINE_SYSTEM | INTRAUTERINE | Status: AC
  Filled 2022-04-18: qty 1

## 2022-04-18 MED ORDER — ONDANSETRON HCL 4 MG/2ML IJ SOLN
INTRAMUSCULAR | Status: DC | PRN
  Administered 2022-04-18 (×2): 4 mg via INTRAVENOUS

## 2022-04-18 MED ORDER — FENTANYL CITRATE (PF) 100 MCG/2ML IJ SOLN
INTRAMUSCULAR | Status: AC
  Filled 2022-04-18: qty 2

## 2022-04-18 MED ORDER — ACETAMINOPHEN 500 MG PO TABS
1000.0000 mg | ORAL_TABLET | ORAL | Status: AC
  Administered 2022-04-18: 1000 mg via ORAL

## 2022-04-18 MED ORDER — IODINE STRONG (LUGOLS) 5 % PO SOLN
ORAL | Status: DC | PRN
  Administered 2022-04-18: 0.1 mL

## 2022-04-18 MED ORDER — LIDOCAINE HCL (CARDIAC) PF 100 MG/5ML IV SOSY
PREFILLED_SYRINGE | INTRAVENOUS | Status: DC | PRN
  Administered 2022-04-18: 60 mg via INTRAVENOUS

## 2022-04-18 MED ORDER — SUGAMMADEX SODIUM 200 MG/2ML IV SOLN
INTRAVENOUS | Status: DC | PRN
  Administered 2022-04-18: 200 mg via INTRAVENOUS

## 2022-04-18 MED ORDER — POVIDONE-IODINE 10 % EX SWAB: 2.0000 | Freq: Once | CUTANEOUS | Status: DC

## 2022-04-18 MED ORDER — PROMETHAZINE HCL 25 MG/ML IJ SOLN: 6.2500 mg | INTRAMUSCULAR | Status: DC | PRN

## 2022-04-18 MED ORDER — SODIUM CHLORIDE 0.9 % IV SOLN
2.0000 g | INTRAVENOUS | Status: AC
  Administered 2022-04-18: 2 g via INTRAVENOUS

## 2022-04-18 MED ORDER — ACETAMINOPHEN 500 MG PO TABS
ORAL_TABLET | ORAL | Status: AC
  Filled 2022-04-18: qty 2

## 2022-04-18 MED ORDER — PROPOFOL 10 MG/ML IV BOLUS
INTRAVENOUS | Status: DC | PRN
  Administered 2022-04-18: 150 mg via INTRAVENOUS

## 2022-04-18 MED ORDER — FENTANYL CITRATE (PF) 100 MCG/2ML IJ SOLN
INTRAMUSCULAR | Status: DC | PRN
  Administered 2022-04-18 (×2): 25 ug via INTRAVENOUS
  Administered 2022-04-18: 100 ug via INTRAVENOUS

## 2022-04-18 MED ORDER — SODIUM CHLORIDE 0.9 % IV SOLN
INTRAVENOUS | Status: AC
  Filled 2022-04-18: qty 2

## 2022-04-18 MED ORDER — MIDAZOLAM HCL 2 MG/2ML IJ SOLN
INTRAMUSCULAR | Status: DC | PRN
  Administered 2022-04-18: 2 mg via INTRAVENOUS

## 2022-04-18 MED ORDER — MIDAZOLAM HCL 2 MG/2ML IJ SOLN
INTRAMUSCULAR | Status: AC
  Filled 2022-04-18: qty 2

## 2022-04-18 MED ORDER — OXYCODONE HCL 5 MG/5ML PO SOLN: 5.0000 mg | Freq: Once | ORAL | Status: DC | PRN

## 2022-04-18 SURGICAL SUPPLY — 95 items
ADH SKN CLS APL DERMABOND .7 (GAUZE/BANDAGES/DRESSINGS) ×3
APL SKNCLS STERI-STRIP NONHPOA (GAUZE/BANDAGES/DRESSINGS)
APL SRG 38 LTWT LNG FL B (MISCELLANEOUS)
APPLICATOR ARISTA FLEXITIP XL (MISCELLANEOUS) IMPLANT
BENZOIN TINCTURE PRP APPL 2/3 (GAUZE/BANDAGES/DRESSINGS) IMPLANT
BLADE CLIPPER SENSICLIP SURGIC (BLADE) IMPLANT
BLADE SURG 11 STRL SS (BLADE) IMPLANT
CABLE HIGH FREQUENCY MONO STRZ (ELECTRODE) IMPLANT
CANISTER SUCT 1200ML W/VALVE (MISCELLANEOUS) IMPLANT
CATH ROBINSON RED A/P 16FR (CATHETERS) ×3 IMPLANT
COVER MAYO STAND STRL (DRAPES) ×3 IMPLANT
DECANTER SPIKE VIAL GLASS SM (MISCELLANEOUS) ×3 IMPLANT
DERMABOND IMPLANT
DERMABOND ADVANCED (GAUZE/BANDAGES/DRESSINGS) ×3
DERMABOND ADVANCED .7 DNX12 (GAUZE/BANDAGES/DRESSINGS) ×3 IMPLANT
DILATOR CANAL MILEX (MISCELLANEOUS) IMPLANT
DRSG COVADERM PLUS 2X2 (GAUZE/BANDAGES/DRESSINGS) IMPLANT
DRSG OPSITE POSTOP 3X4 (GAUZE/BANDAGES/DRESSINGS) IMPLANT
DRSG TELFA 3X8 NADH STRL (GAUZE/BANDAGES/DRESSINGS) ×3 IMPLANT
DURAPREP 26ML APPLICATOR (WOUND CARE) ×3 IMPLANT
ELECT BALL LEEP 3MM BLK (ELECTRODE) IMPLANT
ELECT BALL LEEP 5MM RED (ELECTRODE) IMPLANT
ELECT LEEP 2.0X0.8 R2008 (MISCELLANEOUS)
ELECT LLETZ BALL 5MM DISP (ELECTRODE) IMPLANT
ELECT LOOP LEEP RND 10X10 YLW (CUTTING LOOP) ×3
ELECT LOOP LEEP RND 15X12 GRN (CUTTING LOOP)
ELECT LOOP LEEP RND 20X12 WHT (CUTTING LOOP)
ELECT LOOP LEEP SQR 10X10 ORG (CUTTING LOOP)
ELECT LOOP LLETZ 10X10 DISP (CUTTING LOOP)
ELECTRODE LEEP 2.0X0.8 R2008 (MISCELLANEOUS) IMPLANT
ELECTRODE LOOP LP RND 10X10YLW (CUTTING LOOP) IMPLANT
ELECTRODE LOOP LP RND 15X12GRN (CUTTING LOOP) IMPLANT
ELECTRODE LOOP LP RND 20X12WHT (CUTTING LOOP) IMPLANT
ELECTRODE LOOP LP SQR 10X10ORG (CUTTING LOOP) IMPLANT
ELECTRODE LOOP LTZ 10X10 DISP (CUTTING LOOP) IMPLANT
EXTENDER ELECT LOOP LEEP 10CM (CUTTING LOOP) IMPLANT
GAUZE 4X4 16PLY ~~LOC~~+RFID DBL (SPONGE) ×9 IMPLANT
GLOVE BIO SURGEON STRL SZ 6.5 (GLOVE) ×3 IMPLANT
GLOVE BIOGEL PI IND STRL 7.0 (GLOVE) ×9 IMPLANT
GLOVE ECLIPSE 6.5 STRL STRAW (GLOVE) ×6 IMPLANT
GOWN STRL REUS W/TWL LRG LVL3 (GOWN DISPOSABLE) ×6 IMPLANT
HEMOSTAT ARISTA ABSORB 3G PWDR (HEMOSTASIS) IMPLANT
HIBICLENS CHG 4% 4OZ BTL (MISCELLANEOUS) ×3 IMPLANT
KIT TURNOVER CYSTO (KITS) ×3 IMPLANT
LIGASURE VESSEL 5MM BLUNT TIP (ELECTROSURGICAL) IMPLANT
NDL HYPO 25X1 1.5 SAFETY (NEEDLE) ×3 IMPLANT
NDL INSUFFLATION 14GA 120MM (NEEDLE) ×3 IMPLANT
NDL INSUFFLATION 14GA 150MM (NEEDLE) IMPLANT
NDL SPNL 22GX3.5 QUINCKE BK (NEEDLE) ×3 IMPLANT
NEEDLE HYPO 25X1 1.5 SAFETY (NEEDLE) ×3 IMPLANT
NEEDLE INSUFFLATION 14GA 120MM (NEEDLE) ×3 IMPLANT
NEEDLE INSUFFLATION 14GA 150MM (NEEDLE) IMPLANT
NEEDLE SPNL 22GX3.5 QUINCKE BK (NEEDLE) ×3 IMPLANT
NS IRRIG 500ML POUR BTL (IV SOLUTION) ×3 IMPLANT
PACK LAPAROSCOPY BASIN (CUSTOM PROCEDURE TRAY) ×3 IMPLANT
PACK TRENDGUARD 450 HYBRID PRO (MISCELLANEOUS) ×3 IMPLANT
PACK VAGINAL MINOR WOMEN LF (CUSTOM PROCEDURE TRAY) ×3 IMPLANT
PACK VAGINAL WOMENS (CUSTOM PROCEDURE TRAY) ×3 IMPLANT
PAD OB MATERNITY 4.3X12.25 (PERSONAL CARE ITEMS) ×3 IMPLANT
PAD PREP 24X48 CUFFED NSTRL (MISCELLANEOUS) ×3 IMPLANT
POUCH LAPAROSCOPIC INSTRUMENT (MISCELLANEOUS) ×3 IMPLANT
PROTECTOR NERVE ULNAR (MISCELLANEOUS) ×3 IMPLANT
SCISSORS LAP 5X35 DISP (ENDOMECHANICALS) IMPLANT
SCOPETTES 8  STERILE (MISCELLANEOUS) ×3
SCOPETTES 8 STERILE (MISCELLANEOUS) IMPLANT
SEALER TISSUE G2 CVD JAW 35 (ENDOMECHANICALS) IMPLANT
SEALER TISSUE G2 CVD JAW 45CM (ENDOMECHANICALS)
SET SUCTION IRRIG HYDROSURG (IRRIGATION / IRRIGATOR) IMPLANT
SET TUBE SMOKE EVAC HIGH FLOW (TUBING) ×3 IMPLANT
SHEARS HARMONIC ACE PLUS 36CM (ENDOMECHANICALS) IMPLANT
SLEEVE ADV FIXATION 5X100MM (TROCAR) ×3 IMPLANT
SOLUTION ELECTROLUBE (MISCELLANEOUS) IMPLANT
STRIP CLOSURE SKIN 1/4X4 (GAUZE/BANDAGES/DRESSINGS) IMPLANT
SUT VIC AB 0 CT1 36 (SUTURE) IMPLANT
SUT VIC AB 4-0 PS2 18 (SUTURE) ×3 IMPLANT
SUT VIC AB 4-0 SH 27 (SUTURE)
SUT VIC AB 4-0 SH 27XANBCTRL (SUTURE) IMPLANT
SUT VICRYL 0 UR6 27IN ABS (SUTURE) IMPLANT
SYR 10ML LL (SYRINGE) ×3 IMPLANT
SYR 30ML LL (SYRINGE) IMPLANT
SYR 3ML 23GX1 SAFETY (SYRINGE) IMPLANT
SYS BAG RETRIEVAL 10MM (BASKET)
SYSTEM BAG RETRIEVAL 10MM (BASKET) IMPLANT
SYSTEM CARTER THOMASON II (TROCAR) IMPLANT
TOWEL OR 17X26 10 PK STRL BLUE (TOWEL DISPOSABLE) ×6 IMPLANT
TRAY FOLEY W/BAG SLVR 14FR LF (SET/KITS/TRAYS/PACK) IMPLANT
TRENDGUARD 450 HYBRID PRO PACK (MISCELLANEOUS) ×3
TROCAR ADV FIXATION 5X100MM (TROCAR) ×3 IMPLANT
TROCAR XCEL NON BLADE 8MM B8LT (ENDOMECHANICALS) IMPLANT
TROCAR Z-THREAD FIOS 11X100 BL (TROCAR) IMPLANT
TROCAR Z-THREAD FIOS 5X100MM (TROCAR) ×3 IMPLANT
TUBE CONNECTING 12X1/4 (SUCTIONS) IMPLANT
WARMER LAPAROSCOPE (MISCELLANEOUS) ×3 IMPLANT
WATER STERILE IRR 500ML POUR (IV SOLUTION) ×3 IMPLANT
mirena IMPLANT

## 2022-04-18 NOTE — Transfer of Care (Signed)
Immediate Anesthesia Transfer of Care Note  Patient: SHANDREKA DANTE  Procedure(s) Performed: LAPAROSCOPIC BILATERAL SALPINGECTOMY (Bilateral: Abdomen) INTRAUTERINE DEVICE (IUD) REMOVAL (Uterus) LOOP ELECTROSURGICAL EXCISION PROCEDURE (LEEP) (Cervix) INTRAUTERINE DEVICE (IUD) INSERTION  Patient Location: PACU  Anesthesia Type:General  Level of Consciousness: awake, alert , oriented and patient cooperative  Airway & Oxygen Therapy: Patient Spontanous Breathing and Patient connected to nasal cannula oxygen  Post-op Assessment: Report given to RN and Post -op Vital signs reviewed and stable  Post vital signs: Reviewed and stable  Last Vitals:  Vitals Value Taken Time  BP 103/67 04/18/22 0915  Temp 36.7 C 04/18/22 0911  Pulse 74 04/18/22 0917  Resp 29 04/18/22 0917  SpO2 96 % 04/18/22 0917  Vitals shown include unvalidated device data.  Last Pain:  Vitals:   04/18/22 0911  TempSrc:   PainSc: 0-No pain      Patients Stated Pain Goal: 3 (04/18/22 0557)  Complications: No notable events documented.

## 2022-04-18 NOTE — Anesthesia Postprocedure Evaluation (Signed)
Anesthesia Post Note  Patient: Beth [name]  Procedure(s) Performed: LAPAROSCOPIC BILATERAL SALPINGECTOMY (Bilateral: Abdomen) INTRAUTERINE DEVICE (IUD) REMOVAL (Uterus) LOOP ELECTROSURGICAL EXCISION PROCEDURE (LEEP)/COLPOSCOPY (Cervix) INTRAUTERINE DEVICE (IUD) INSERTION (Uterus)     Patient location during evaluation: PACU Anesthesia Type: General Level of consciousness: awake and alert Pain management: pain level controlled Vital Signs Assessment: post-procedure vital signs reviewed and stable Respiratory status: spontaneous breathing, nonlabored ventilation, respiratory function stable and patient connected to nasal cannula oxygen Cardiovascular status: blood pressure returned to baseline and stable Postop Assessment: no apparent nausea or vomiting Anesthetic complications: no   No notable events documented.  Last Vitals:  Vitals:   04/18/22 0945 04/18/22 1020  BP: 96/61 102/69  Pulse: 69 77  Resp: (!) 23 16  Temp: 36.7 C 37.2 C  SpO2: 95% 96%    Last Pain:  Vitals:   04/18/22 0911  TempSrc:   PainSc: 0-No pain                 Shelton Silvas
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
## 2022-04-18 NOTE — Op Note (Signed)
04/18/2022  9:23 AM  PATIENT:  Beth [name]  31 y.o. adult  PRE-OPERATIVE DIAGNOSIS:  Undesired Fertility Encounter for removal of IUD, CIN 1, persistent abnormal pap smear, menorrhagia  POST-OPERATIVE DIAGNOSIS:  Undesired Fertility Encounter for removal of IUD, persistent abnormal pap smear, menorrhagia, CIN 1  PROCEDURE:  Procedure(s): LAPAROSCOPIC BILATERAL SALPINGECTOMY INTRAUTERINE DEVICE (IUD) REMOVAL LOOP ELECTROSURGICAL EXCISION PROCEDURE (LEEP) WITH COLPOSCOPY INTRAUTERINE DEVICE (IUD) INSERTION  SURGEON:  Jerene Bears  ASSISTANTS: OR staff.    ANESTHESIA:   general  ESTIMATED BLOOD LOSS: 20 mL  BLOOD ADMINISTERED:none   FLUIDS: 800cc LR  UOP: 150cc, clear UOPD  SPECIMEN:  bilateral fallopian tubes, cervical conization with pin at 12 o'clock, ECC  DISPOSITION OF SPECIMEN:  PATHOLOGY  FINDINGS: normal pelvis and upper abdomen  DESCRIPTION OF OPERATION: Patient is taken to the operating room. She is placed in the supine position. She is a running IV in place. Informed consent was present on the chart. SCDs on her lower extremities and functioning properly. Patient was positioned while she was awake.  Her legs were placed in the low lithotomy position in [place] stirrups. Her arms were tucked by the side.  General endotracheal anesthesia was administered by the anesthesia staff without difficulty. Dr. Hart Rochester, anesthesia, oversaw case.  Time out performed.    Clora prep was then used to prep the abdomen and Betadine was used to prep the inner thighs, perineum and vagina. Once 3 minutes had past the patient was draped in a normal standard fashion. The legs were lifted to the high lithotomy position. The cervix was visualized by placing a heavy weighted speculum in the posterior aspect of the vagina and using a curved Deaver retractor to the retract anteriorly. The anterior lip of the cervix was grasped with single-tooth tenaculum.  IUD string was grasped and removed with  one pull.  The cervix sounded to 7 cm. Hulka clamp was passed through the cervix and attached to the anterior lip of the cervix.  The tenaculum was removed.  The speculum was removed as well. A Foley catheter was placed to straight drain.  Clear urine was noted. Legs were lowered to the low lithotomy position and attention was turned the abdomen.  The umbilicus was everted.  Marcaine 0.25% used to anesthetize the skin.  Using #11 blade, 44mm skin incision was made.  A Veress needle was obtained. Syringe of sterile saline was placed on a open Veress needle.  With the abdomen elevated, the Veress needle was passed into the umbilicus until the pop was heard and then fluid started to drip.  Then low flow CO2 gas was attached the needle and the pneumoperitoneum was achieved without difficulty. Once 3.5L of gas was in the abdomen the Veress needle was removed and a 5 millimeter non-bladed Optiview trocar and port were passed directly to the abdomen. The laparoscope was then used to confirm intraperitoneal placement. Findings included normal pelvis and upper abdomen.  Locations for RLQ, LLQ, and suprapubic ports were noted by transillumination of the abdominal wall.  0.25% marcaine was used to anesthetize the skin.  70mm skin incision was made in the LLQ and 26mm non bladed trochar and port were placed under direct visualization.  Then a 31mm skin incision was made and a 38mm nonbladed trochar and port was placed in the RLQ.  All trochars were removed.    Ureters were identifies.  Attention was turned to the left side. With uterus on stretch the left tube was excised off  the ovary using the Ligasure device and mesosalpinx was dissected to free the tube. The tube was clamped, cauterized and incised to free the tube.  Then in a similar fashion, the uterus was placed on stretch to the opposite side.  The tube was excised off the ovary using the Ligasure device and then the mesosalpinx was incised freeing the tube. Then the  right tube was clamped, cauterized and incised close to the uterus, freeing the tube.  Both tubes were removed intact.    At this point the laparoscopic portion of the procedure was completed.  CO2 gas pressure was decreased.  No bleeding was noted.  The remaining instruments were removed.  The ports (except the umbilical port) were removed under direct visualization of the laparoscope and the pneumoperitoneum was relieved.  The patient was taken out of Trendelenburg positioning.  Several deep breaths were given to the patient's trying to any gas the abdomen and finally the umbilical port was removed.  The skin was then closed with subcuticular stitches of 3-0 Vicryl. The skin was cleansed Dermabond was applied. The abdomen was covered and then a Attention was then turned the vagina,  Painted speculum was placed and Hulka clamp was removed.  Cervix painted with Lugol's solution.  Colposcopy was performed with 15x magnification.  Area of AWE noted at 9 - 10 o'clock.  Paracervical block with 1% Lidocaine with 1:200,000 U with epinephrine was placed.  7-8cc total was used.  Then using 10 x 12 loop, the entire transformation zone was excised with one pass.  Specimen was pinned at 12 o'clock.  Then ECC was done above the conization specimen.  Ball cautery was used to obtain excellent hemostasis.  No bleeding was noted.   Tenaculum was then placed again on the anterior lip of the cervix.  Uterus sounded again to 7 cm.  Mirena IUD and device to passed to the fundus and slightly withdrawn.  The IUD was released into the endometrial cavity and then the introducer removed.  Strings cut to 2cm.  Tenaculum was removed.  Monsels was placed in the cone bed to help with hemostasis.  At this point the entire procedure was done and the speculum was removed.    Foley was removed.  Sponge, lap, needle, instrument counts were correct x2. Patient tolerated the procedure very well. She was awakened from anesthesia, extubated  and taken to recovery in stable condition.   COUNTS:  YES  PLAN OF CARE: Transfer to PACU

## 2022-04-18 NOTE — Anesthesia Procedure Notes (Signed)
Procedure Name: Intubation Date/Time: 04/18/2022 7:53 AM  Performed by: Georgeanne Nim, CRNAPre-anesthesia Checklist: Patient identified, Emergency Drugs available, Suction available, Patient being monitored and Timeout performed Patient Re-evaluated:Patient Re-evaluated prior to induction Oxygen Delivery Method: Circle system utilized Preoxygenation: Pre-oxygenation with 100% oxygen Induction Type: IV induction Ventilation: Mask ventilation without difficulty Laryngoscope Size: Mac and 3 Grade View: Grade I Tube type: Oral Tube size: 7.0 mm Number of attempts: 1 Airway Equipment and Method: Stylet Placement Confirmation: ETT inserted through vocal cords under direct vision, positive ETCO2, CO2 detector and breath sounds checked- equal and bilateral Secured at: 21 cm Tube secured with: Tape Dental Injury: Teeth and Oropharynx as per pre-operative assessment

## 2022-04-18 NOTE — Anesthesia Preprocedure Evaluation (Addendum)
Anesthesia Evaluation  Patient identified by MRN, date of birth, ID band Patient awake    Reviewed: Allergy & Precautions, NPO status , Patient's Chart, lab work & pertinent test results  Airway Mallampati: II  TM Distance: >3 FB Neck ROM: Full    Dental  (+) Teeth Intact   Pulmonary Current Smoker and Patient abstained from smoking.,    breath sounds clear to auscultation       Cardiovascular negative cardio ROS   Rhythm:Regular Rate:Normal     Neuro/Psych PSYCHIATRIC DISORDERS Anxiety Depression Bipolar Disorder    GI/Hepatic Neg liver ROS, GERD  ,  Endo/Other  negative endocrine ROS  Renal/GU negative Renal ROS     Musculoskeletal negative musculoskeletal ROS (+)   Abdominal   Peds  Hematology   Anesthesia Other Findings Nasal piercings - Accepts risk burns, skin tears and other related tissue damage.   Reproductive/Obstetrics                            Anesthesia Physical Anesthesia Plan  ASA: 2  Anesthesia Plan: General   Post-op Pain Management:    Induction: Intravenous  PONV Risk Score and Plan: 3 and Ondansetron, Dexamethasone and Midazolam  Airway Management Planned: Oral ETT  Additional Equipment: None  Intra-op Plan:   Post-operative Plan: Extubation in OR  Informed Consent:   Plan Discussed with: CRNA  Anesthesia Plan Comments:         Anesthesia Quick Evaluation

## 2022-04-18 NOTE — Discharge Instructions (Addendum)
Post-surgical Instructions, Outpatient Surgery  You may expect to feel dizzy, weak, and drowsy for as long as 24 hours after receiving the medicine that made you sleep (anesthetic). For the first 24 hours after your surgery:   Do not drive a car, ride a bicycle, participate in physical activities, or take public transportation until you are done taking narcotic pain medicines or as directed by Dr. Hyacinth Meeker.  Do not drink alcohol or take tranquilizers.  Do not take medicine that has not been prescribed by your physicians.  Do not sign important papers or make important decisions while on narcotic pain medicines.  Have a responsible person with you.   CARE OF INCISION If you have a bandage, you may remove it in one day.  If there are steri-strips or dermabond, just let this loosen on its own.  You may shower on the first day after your surgery.  Do not sit in a tub bath for one week. Avoid heavy lifting (more than 10 pounds/4.5 kilograms), pushing, or pulling.  Avoid activities that may risk injury to your incisions.   PAIN MANAGEMENT Motrin 800mg .  (This is the same as 4-200mg  over the counter tablets of Motrin or ibuprofen.)  You may take this every eight hours or as needed for cramping.   Vicodin 5/325mg .  For more severe pain, take one or two tablets every four to six hours as needed for pain control.  (Remember that narcotic pain medications increase your risk of constipation.  If this becomes a problem, you may take an over the counter stool softener like Colace 100mg  up to four times a day.)  DO'S AND DON'T'S Do not take a tub bath for one week.  You may shower on the first day after your surgery Do not do any heavy lifting for one to two weeks.  This increases the chance of bleeding. Do move around as you feel able.  Stairs are fine.  You may begin to exercise again as you feel able.  Do not lift any weights for two weeks. Do not put anything in the vagina for FOUR weeks--no tampons,  intercourse, or douching.    REGULAR MEDIATIONS/VITAMINS: You may restart all of your regular medications as prescribed. You may restart all of your vitamins as you normally take them.    PLEASE CALL OR SEEK MEDICAL CARE IF: You have persistent nausea and vomiting.  You have trouble eating or drinking.  You have an oral temperature above 100.5.  You have constipation that is not helped by adjusting diet or increasing fluid intake. Pain medicines are a common cause of constipation.  You have heavy vaginal bleeding You have redness or drainage from your incision(s) or there is increasing pain or tenderness near or in the surgical site.     Post Anesthesia Home Care Instructions  Activity: Get plenty of rest for the remainder of the day. A responsible individual must stay with you for 24 hours following the procedure.  For the next 24 hours, DO NOT: -Drive a car - -Drink alcoholic beverages -Take any medication unless instructed by your physician -Make any legal decisions or sign important papers.  Meals: Start with liquid foods such as gelatin or soup. Progress to regular foods as tolerated. Avoid greasy, spicy, heavy foods. If nausea and/or vomiting occur, drink only clear liquids until the nausea and/or vomiting subsides. Call your physician if vomiting continues.  Special Instructions/Symptoms: Your throat may feel dry or sore from the anesthesia or the breathing  tube placed in your throat during surgery. If this causes discomfort, gargle with warm salt water. The discomfort should disappear within 24 hours.  If you had a scopolamine patch placed behind your ear for the management of post- operative nausea and/or vomiting:  1. The medication in the patch is effective for 72 hours, after which it should be removed.  Wrap patch in a tissue and discard in the trash. Wash hands thoroughly with soap and water. 2. You may remove the patch earlier than 72 hours if you  experience unpleasant side effects which may include dry mouth, dizziness or visual disturbances. 3. Avoid touching the patch. Wash your hands with soap and water after contact with the patch.   No ibuprofen, Advil, Aleve, Motrin, ketorolac, meloxicam, naproxen, or other NSAIDS until after 2:30 pm today if needed.

## 2022-04-19 ENCOUNTER — Encounter (HOSPITAL_BASED_OUTPATIENT_CLINIC_OR_DEPARTMENT_OTHER): Payer: Self-pay | Admitting: Obstetrics & Gynecology

## 2022-04-19 LAB — SURGICAL PATHOLOGY
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
## 2022-05-18 ENCOUNTER — Ambulatory Visit (INDEPENDENT_AMBULATORY_CARE_PROVIDER_SITE_OTHER): Payer: No Typology Code available for payment source | Admitting: Obstetrics & Gynecology

## 2022-05-18 VITALS — BP 132/58 | HR 114 | Ht 61.0 in | Wt 172.0 lb

## 2022-05-18 DIAGNOSIS — B977 Papillomavirus as the cause of diseases classified elsewhere: Secondary | ICD-10-CM

## 2022-05-18 DIAGNOSIS — Z9889 Other specified postprocedural states: Secondary | ICD-10-CM

## 2022-05-18 DIAGNOSIS — Z30431 Encounter for routine checking of intrauterine contraceptive device: Secondary | ICD-10-CM

## 2022-05-18 DIAGNOSIS — N870 Mild cervical dysplasia: Secondary | ICD-10-CM

## 2022-05-20 ENCOUNTER — Encounter (HOSPITAL_BASED_OUTPATIENT_CLINIC_OR_DEPARTMENT_OTHER): Payer: Self-pay | Admitting: Obstetrics & Gynecology

## 2022-05-20 NOTE — Progress Notes (Signed)
GYNECOLOGY  VISIT  CC:   post op recheck  HPI: 31 y.o. G6P0010 Single White or Caucasian female here for recheck after undergoing bilateral salpingectomy, LEEP, and IUD removal/replacement on 04/18/2022.  Denies pain.  Bowel/bladder function is normal.  Is feeling like there is something poking in her vagina so will look at string today.  Denies vaginal bleeding.  Reports she's done really well since surgery.  Pathology reviewed:  Yes .  Questions answered.    MEDS:   Current Outpatient Medications on File Prior to Visit  Medication Sig Dispense Refill   ARIPiprazole (ABILIFY) 20 MG tablet Take 20 mg by mouth daily.     Cannabinoids (THC FREE PO) Take by mouth. Per pt , about 15 mg of THC.  Edibles     divalproex (DEPAKOTE) 500 MG DR tablet Take 500 mg by mouth 3 (three) times daily. Takes for bipolar disorder. Patient is tapering off of Depakote and will be completely off in 2 weeks per pt on 04/03/22.     gabapentin (NEURONTIN) 300 MG capsule Take 300 mg by mouth 3 (three) times daily. Patient is taking for anxiety.     HYDROcodone-acetaminophen (NORCO/VICODIN) 5-325 MG tablet Take 1-2 tablets by mouth every 6 (six) hours as needed for moderate pain. 15 tablet 0   ibuprofen (ADVIL) 800 MG tablet Take 1 tablet (800 mg total) by mouth every 8 (eight) hours as needed. 30 tablet 0   lamoTRIgine (LAMICTAL) 100 MG tablet Take 100 mg by mouth every morning.     levonorgestrel (MIRENA) 20 MCG/24HR IUD 1 each by Intrauterine route once.     loperamide (IMODIUM) 2 MG capsule Take 1 capsule (2 mg total) by mouth 3 (three) times daily before meals. Take 45 minutes before meals (Patient not taking: Reported on 04/14/2022) 90 capsule 3   LORazepam (ATIVAN) 1 MG tablet Take 1 mg by mouth as needed for anxiety.     ondansetron (ZOFRAN) 4 MG tablet Take 1 tablet (4 mg total) by mouth every 8 (eight) hours as needed for nausea or vomiting. 40 tablet 1   traZODone (DESYREL) 50 MG tablet Take 1 tablet (50 mg total)  by mouth at bedtime. 30 tablet 1   No current facility-administered medications on file prior to visit.    SH:  Smoking Yes    PHYSICAL EXAMINATION:   BP:  132/58 P: 114  Wt: 172  Ht 5'1" There were no vitals taken for this visit.    General appearance: alert, cooperative and appears stated age CV:  Regular rate and rhythm Lungs:  clear to auscultation, no wheezes, rales or rhonchi, symmetric air entry Abdomen: soft, non-tender; bowel sounds normal; no masses,  no organomegaly Incisions:  C/D/I  Pelvic: External genitalia:  no lesions              Urethra:  normal appearing urethra with no masses, tenderness or lesions              Bartholins and Skenes: normal                 Vagina: normal appearing vagina with normal color and discharge, no lesions              Cervix: no lesions and IUD string noted and was trimmed about 1cm              Bimanual Exam:  Uterus:  normal size, contour, position, consistency, mobility, non-tender  Adnexa: no mass, fullness, tenderness  Chaperone, Ina Homes, CMA, was present for exam.  Assessment/Plan: 1. Post-operative state - doing well.  Released to have normal activity.  2. Dysplasia of cervix, low grade (CIN 1) - recommended pap smear with HR HPV in 1 year.  Pathology reviewed.  Positive ectocervical margin discussed with pt.  3. High risk HPV infection  4. IUD check up - string trimmed today.

## 2022-06-14 ENCOUNTER — Ambulatory Visit (INDEPENDENT_AMBULATORY_CARE_PROVIDER_SITE_OTHER): Payer: Self-pay | Admitting: Family Medicine

## 2022-06-14 VITALS — BP 117/85 | HR 84 | Ht 61.0 in | Wt 175.0 lb

## 2022-06-14 DIAGNOSIS — R1030 Lower abdominal pain, unspecified: Secondary | ICD-10-CM

## 2022-06-14 DIAGNOSIS — K580 Irritable bowel syndrome with diarrhea: Secondary | ICD-10-CM | POA: Insufficient documentation

## 2022-06-14 DIAGNOSIS — R631 Polydipsia: Secondary | ICD-10-CM

## 2022-06-14 DIAGNOSIS — Z23 Encounter for immunization: Secondary | ICD-10-CM | POA: Insufficient documentation

## 2022-06-14 DIAGNOSIS — F332 Major depressive disorder, recurrent severe without psychotic features: Secondary | ICD-10-CM

## 2022-06-14 DIAGNOSIS — R3589 Other polyuria: Secondary | ICD-10-CM | POA: Insufficient documentation

## 2022-06-14 LAB — POCT GLYCOSYLATED HEMOGLOBIN (HGB A1C): Hemoglobin A1C: 5.5 % (ref 4.0–5.6)

## 2022-06-14 MED ORDER — ONDANSETRON HCL 4 MG PO TABS: 4.0000 mg | ORAL_TABLET | Freq: Three times a day (TID) | ORAL | 1 refills | Status: DC | PRN

## 2022-06-14 MED ORDER — LOPERAMIDE HCL 2 MG PO TABS: 2.0000 mg | ORAL_TABLET | Freq: Four times a day (QID) | ORAL | 0 refills | Status: DC | PRN

## 2022-06-14 NOTE — Progress Notes (Signed)
SUBJECTIVE:   CHIEF COMPLAINT / HPI:   Beth [name] is a 31 y.o. adult who presents to the [facility] clinic today to discuss the following concerns:   Vomiting and Diarrhea  Ongoing for the last week and a half. Has had some formed stools during this time but mostly diarrhea. They estimate about 10-15 episodes each day. No blood in their stools.   Feels nauseous today but no vomiting today. They were prescribed Zofran for nausea. States that they have been dealing with vomiting for several years. They had an EGD and colonoscopy 01/2021. Biopsies returned WITHOUT evidence of celiac disease, h. Pylori, or microscopic colitis/chronic inflammatory changes.  Also notes urinary frequency and polydipsia. No dysuria. No hematuria. They are concerned that they may be diabetic, father has T2DM.    Depressed Mood Ongoing depressed mood but feels like her mood is getting worse. Feels like she is struggling to find her purpose.  Last suicide attempt in 2021 by alcohol intoxication. Hx of cutting in 2021. She has been sober from alcohol since 2021. She has a supportive partner and denies any thoughts of SI.  Sees a therapist/counselor, next appointment is tomorrow.  Sees a Psychiatrist, has appointment next week. Her medications have been adjusted recently.   PERTINENT  PMH / PSH: MDD, anxiety, hx alcohol use disorder (sober since 2021)  OBJECTIVE:   BP 117/85   Pulse 84   Ht 5\' 1"  (1.549 m)   Wt 175 lb (79.4 kg)   SpO2 99%   BMI 33.07 kg/m    General: NAD, pleasant, able to participate in exam Cardiac: RRR, no murmurs. Respiratory: normal effort on room air  Abdomen: Hypoactive bowel sounds, soft, mild ttp to lower abdomen and RUQ without R/G, no hepatosplenomegaly  Skin: warm and dry, no rashes noted Psych: Anxious affect and normal mood     06/14/2022   12:28 PM 04/14/2022   10:31 AM 10/25/2021    3:39 PM  Depression screen PHQ 2/9  Decreased Interest 2 0 1  Down, Depressed, Hopeless  2 1 2   PHQ - 2 Score 4 1 3   Altered sleeping 0  1  Tired, decreased energy 2  3  Change in appetite 2  3  Feeling bad or failure about yourself  3  2  Trouble concentrating 3  3  Moving slowly or fidgety/restless 1  2  Suicidal thoughts 1  0  PHQ-9 Score 16  17  Difficult doing work/chores Extremely dIfficult  Very difficult   ASSESSMENT/PLAN:   Irritable bowel syndrome with diarrhea Positive Rome IV criteria. Has tried Loperamide previously but not daily or routinely. Negative GI work up in the past. No bloody stools or weight loss. -Rx Loperamide, take 45 minutes prior to each meal. Should try to take routinely to see if helps with sx -Can consider bile acid sequestrants (cholestyramine, colestipol) if diarrhea persists despite antidiarrheals   Abdominal pain Mild ttp to lower abdomen and RUQ without R/G. Given ongoing GI losses will check CMP. Also to check for liver dysfunction. -CMP -Rx Zofran for nausea   Polyuria Reported with polydipsia. Hgb A1c returned at 5.5. No dysuria, hematuria. -Monitor, consider UA if symptoms persist.   Influenza vaccination given Received today without complication.   MDD (major depressive disorder), recurrent episode, severe ([place]) Followed by therapist and Psychiatrist. Has appointments with both of these specialists in the next week. They have resources for suicide hotline and denies any SI currently. Feels safe with partner  and admits to good support system. No recent self harm. Encouraged them to discuss with therapist/Psychiatrist. May need possible medication adjustments.     Sabino Dick, DO [facility]

## 2022-06-14 NOTE — Patient Instructions (Signed)
It was wonderful to see you today.  Please bring ALL of your medications with you to every visit.   Today we talked about:  -I am sending a prescription for loperamide which you can take up to 4 times a day.  Its best to take 45 minutes before a meal regularly.  This should help with your diarrhea. -We are doing blood test today to check your liver function and electrolytes given your GI losses.  -I have refilled your Zofran.   Thank you for coming to your visit as scheduled. We have had a large "no-show" problem lately, and this significantly limits our ability to see and care for patients. As a friendly reminder- if you cannot make your appointment please call to cancel. We do have a no show policy for those who do not cancel within 24 hours. Our policy is that if you miss or fail to cancel an appointment within 24 hours, 3 times in a 57-month period, you may be dismissed from our clinic.   Thank you for choosing [facility].   Please call [phone] with any questions about today's appointment.  Please be sure to schedule follow up at the front  desk before you leave today.   Sharion Settler, DO PGY-3 Family Medicine

## 2022-06-14 NOTE — Assessment & Plan Note (Signed)
Received today without complication. 

## 2022-06-14 NOTE — Assessment & Plan Note (Signed)
Reported with polydipsia. Hgb A1c returned at 5.5. No dysuria, hematuria. -Monitor, consider UA if symptoms persist.

## 2022-06-14 NOTE — Assessment & Plan Note (Signed)
Mild ttp to lower abdomen and RUQ without R/G. Given ongoing GI losses will check CMP. Also to check for liver dysfunction. -CMP -Rx Zofran for nausea

## 2022-06-14 NOTE — Assessment & Plan Note (Signed)
Followed by therapist and Psychiatrist. Has appointments with both of these specialists in the next week. They have resources for suicide hotline and denies any SI currently. Feels safe with partner and admits to good support system. No recent self harm. Encouraged them to discuss with therapist/Psychiatrist. May need possible medication adjustments.

## 2022-06-14 NOTE — Assessment & Plan Note (Signed)
Positive Rome IV criteria. Has tried Loperamide previously but not daily or routinely. Negative GI work up in the past. No bloody stools or weight loss. -Rx Loperamide, take 45 minutes prior to each meal. Should try to take routinely to see if helps with sx -Can consider bile acid sequestrants (cholestyramine, colestipol) if diarrhea persists despite antidiarrheals

## 2022-06-15 ENCOUNTER — Other Ambulatory Visit: Payer: Self-pay | Admitting: Family Medicine

## 2022-06-15 DIAGNOSIS — F410 Panic disorder [episodic paroxysmal anxiety] without agoraphobia: Secondary | ICD-10-CM | POA: Diagnosis not present

## 2022-06-15 DIAGNOSIS — R748 Abnormal levels of other serum enzymes: Secondary | ICD-10-CM

## 2022-06-15 DIAGNOSIS — F4310 Post-traumatic stress disorder, unspecified: Secondary | ICD-10-CM | POA: Diagnosis not present

## 2022-06-15 DIAGNOSIS — R69 Illness, unspecified: Secondary | ICD-10-CM | POA: Diagnosis not present

## 2022-06-15 LAB — COMPREHENSIVE METABOLIC PANEL
ALT: 45 IU/L — ABNORMAL HIGH (ref 0–32)
AST: 22 IU/L (ref 0–40)
Albumin/Globulin Ratio: 2.2 (ref 1.2–2.2)
Albumin: 5 g/dL — ABNORMAL HIGH (ref 3.9–4.9)
Alkaline Phosphatase: 95 IU/L (ref 44–121)
BUN/Creatinine Ratio: 8 — ABNORMAL LOW (ref 9–23)
BUN: 6 mg/dL (ref 6–20)
Bilirubin Total: 0.6 mg/dL (ref 0.0–1.2)
CO2: 20 mmol/L (ref 20–29)
Calcium: 10.3 mg/dL — ABNORMAL HIGH (ref 8.7–10.2)
Chloride: 104 mmol/L (ref 96–106)
Creatinine, Ser: 0.77 mg/dL (ref 0.57–1.00)
Globulin, Total: 2.3 g/dL (ref 1.5–4.5)
Glucose: 104 mg/dL — ABNORMAL HIGH (ref 70–99)
Potassium: 4.2 mmol/L (ref 3.5–5.2)
Sodium: 141 mmol/L (ref 134–144)
Total Protein: 7.3 g/dL (ref 6.0–8.5)
eGFR: 104 mL/min/{1.73_m2} (ref >59)

## 2022-06-16 ENCOUNTER — Telehealth: Payer: Self-pay

## 2022-06-16 NOTE — Telephone Encounter (Signed)
Patient calls nurse line requesting an alternative nausea medication.   She reports she has been taking the medication, however has continued to vomit. She reports 2 episodes today.   Denies any bloody emesis, fevers or body aches.   She reports she is able to keep fluids down.   Will forward to provider who saw patient.   Red flags discussed.

## 2022-06-19 ENCOUNTER — Other Ambulatory Visit: Payer: Self-pay | Admitting: Family Medicine

## 2022-06-19 ENCOUNTER — Telehealth: Payer: Self-pay

## 2022-06-19 MED ORDER — PROMETHAZINE HCL 25 MG PO TABS: 25.0000 mg | ORAL_TABLET | Freq: Three times a day (TID) | ORAL | 0 refills | Status: DC | PRN

## 2022-06-19 NOTE — Telephone Encounter (Signed)
Rx limited supply of phenergan

## 2022-06-19 NOTE — Telephone Encounter (Signed)
Patient returns call to nurse line.   Patient advised of phenergan prescription.   Patient advised she will need another clinic visit if no improvement. She verbalized understanding.

## 2022-06-19 NOTE — Telephone Encounter (Signed)
Patient calls nurse line in regards to Phenergan 25mg  prescription.   She reports difficulty picking up prescription. I called the pharmacy and spoke with a tech, she reports an insurance issue on her end and that I will need to speak to the pharmacist.   Unfortunately, I waited on hold for ~52 minutes and the call was disconnected. I called patient, she reports [employer] and she reports she does not think the pharmacy has her updated info as she previously had medicaid.  Patient also is familiar with good rx and will try to purchase prescription with coupon.   Patient was appreciative of my time.   She will call back tomorrow if unable to pick up prescription or if unaffordable.

## 2022-06-22 ENCOUNTER — Ambulatory Visit (INDEPENDENT_AMBULATORY_CARE_PROVIDER_SITE_OTHER): Payer: 59 | Admitting: Student

## 2022-06-22 VITALS — BP 107/72 | HR 86 | Ht 61.0 in | Wt 173.6 lb

## 2022-06-22 DIAGNOSIS — R1115 Cyclical vomiting syndrome unrelated to migraine: Secondary | ICD-10-CM | POA: Diagnosis not present

## 2022-06-22 DIAGNOSIS — F332 Major depressive disorder, recurrent severe without psychotic features: Secondary | ICD-10-CM

## 2022-06-22 DIAGNOSIS — R69 Illness, unspecified: Secondary | ICD-10-CM | POA: Diagnosis not present

## 2022-06-22 DIAGNOSIS — R1110 Vomiting, unspecified: Secondary | ICD-10-CM

## 2022-06-22 MED ORDER — CAPSAICIN 0.1 % EX CREA: TOPICAL_CREAM | CUTANEOUS | 0 refills | Status: DC

## 2022-06-22 NOTE — Patient Instructions (Signed)
It was great to see you! Thank you for allowing me to participate in your care!   Our plans for today:  - We will get your MRI performed - I have prescribed capsaicin cream to place on stomach 4 times daily to see if this could help.   If you are feeling suicidal or depression symptoms worsen please immediately go to:   If you are thinking about harming yourself or having thoughts of suicide, or if you know someone who is, seek help right away. If you are in crisis, make sure you are not left alone.  If someone else is in crisis, make sure he/she/they is not left alone  Call 988 OR 1-800-273-TALK  24 Hour Availability for Walk-IN services  [facility]  [address] [place], [place] [facility] [place] [phone] Crisis [phone]    Other crisis resources:  [facility] [employer] (Domestic Violence, Rape & Victim Assistance [phone]  [facility] [employer] [facility]    (ONLY from 8am-4pm)    [phone]  [facility] Mobile Crisis [facility] (24/7)   [phone]  [place] National Suicide Hotline   ([phone] (TALK)   Take care and seek immediate care sooner if you develop any concerns.  Levin Erp, MD

## 2022-06-22 NOTE — Assessment & Plan Note (Signed)
Continues to have vomiting for 2 to 3 weeks that is not responding well to Phenergan/Zofran.  Differential includes cyclical vomiting syndrome related to cannabis use which I counseled patient on.  We will trial some capsaicin cream to see if this may help.  Other differentials include intracranial process given patient is having some blurred vision that is worsened more recently as well as left-sided sensation deficit alongside frontal headache.  This could include something like a pseudotumor cerebri.  No fevers/sick symptoms and the diarrhea seems to be resolving. -MRI without contrast ASAP -Capsaicin cream 4 times daily on abdomen

## 2022-06-22 NOTE — Progress Notes (Signed)
SUBJECTIVE:   CHIEF COMPLAINT / HPI: Emesis  Vomiting and Diarrhea Recently seen on 11/1 for vomiting and diarrhea.  Has previously had an EGD and colonoscopy 01/2021.  Biopsies during that time showed no evidence of celiac disease, H. pylori or inflammatory changes.  Does have history of EtOH abuse however patient has been abstinent since. Does have chronic anxiety and history of MDD and hx of bulimia.  Had previously been prescribed Protonix 40 mg daily and glycopyrrolate by GI but no longer taking or following with them.  Has been prescribed Zofran and Phenergan. Does continue to take cannabis gummies daily.  Having vomiting and diarrhea for 2-3 weeks now. Stool has improved and less watery however they are still vomiting. Today took phenergan and zofran vomited threw those 2 times today. NBNB. No blood or dark colroed stools-appears yellow in color.   Abdominal pain all over, nausea still present. Sometimes gets a sharp pain on the top of their abdomen. No pain right now. Carbonated beverages sometimes help/sometimes doesn't. No fevers. Keeps 1 meal down a day. Drinking small amounts frequently.  Usually takes 4 mg zofran daily-sometimes 2 times a day  Does take edibles everyday Caffeine-2 energy drinks a day More blurry vision for a few months, worse more recently, headache in front of head a lot of the times  He said that this episode feels similar to the previous ones and that they get episodes like this with vomiting and diarrhea every 3 months.  Mood Patient says they do sometimes get intrusive thoughts to harm themselves.  Does not have any specific plans they shared.  Does have very good support system at home with her partner and whenever they get thoughts they always talk to them or the best friend.  They have close follow-up with psychiatry as well.  A lot of what their feelings are about revolve around their current job and not feeling like they want to be where they are not  right now.  They are currently looking into different jobs every day but interviewing has been difficult.     06/22/2022    5:13 PM 06/14/2022   12:28 PM 04/14/2022   10:31 AM 10/25/2021    3:39 PM 10/17/2021   10:37 AM  Depression screen PHQ 2/9  Decreased Interest 2 2 0 1 0  Down, Depressed, Hopeless 3 2 1 2  0  PHQ - 2 Score 5 4 1 3  0  Altered sleeping 0 0  1   Tired, decreased energy 1 2  3    Change in appetite 2 2  3    Feeling bad or failure about yourself  3 3  2    Trouble concentrating 3 3  3    Moving slowly or fidgety/restless 2 1  2    Suicidal thoughts 1 1  0   PHQ-9 Score 17 16  17    Difficult doing work/chores Extremely dIfficult Extremely dIfficult  Very difficult      PERTINENT  PMH / PSH: MDD  OBJECTIVE:   BP 107/72   Pulse 86   Ht 5\' 1"  (1.549 m)   Wt 173 lb 9.6 oz (78.7 kg)   SpO2 100%   BMI 32.80 kg/m   General: Nontoxic, NAD, awake, alert, responsive to questions Head: Normocephalic atraumatic, fissured tongue CV: Regular rate and rhythm no murmurs rubs or gallops Respiratory: Clear to ausculation bilaterally, no wheezes rales or crackles, chest rises symmetrically,  no increased work of breathing Abdomen: Soft, tender to palpation diffusely,  obese sign, negative Rovsing sign, negative straight leg test, no significant epigastric tenderness, non-distended, normoactive bowel sounds  Extremities: Moves upper and lower extremities freely Neuro: CN II: PERRL CN III, IV,VI: EOMI CV V: Decreased sensation left side in V1, V2, V3 CVII: Symmetric smile and brow raise CN VIII: Normal hearing CN IX,X: Symmetric palate raise  CN XI: 5/5 shoulder shrug CN XII: Symmetric tongue protrusion  UE and LE strength 5/5 Decreased sensation in LUE and LLE bilaterally  No ataxia with finger to nose  ASSESSMENT/PLAN:   Persistent recurrent vomiting Continues to have vomiting for 2 to 3 weeks that is not responding well to Phenergan/Zofran.  Differential includes cyclical  vomiting syndrome related to cannabis use which I counseled patient on.  We will trial some capsaicin cream to see if this may help.  Other differentials include intracranial process given patient is having some blurred vision that is worsened more recently as well as left-sided sensation deficit alongside frontal headache.  This could include something like a pseudotumor cerebri.  No fevers/sick symptoms and the diarrhea seems to be resolving. -MRI without contrast ASAP -Capsaicin cream 4 times daily on abdomen  MDD (major depressive disorder), recurrent episode, severe ([facility]) Continues to have passive SI.  Feels very safe with their home life and partner when they have intrusive thoughts.  Safety contracted them.  By therapy and psychiatry closely and patient is being weaned down from their Abilify currently.  They believe also acute vomiting/diarrhea has been contributing to mood as well. -Monitor at future visits -AVS SI resources provided    Levin Erp, MD [facility]

## 2022-06-22 NOTE — Assessment & Plan Note (Signed)
Continues to have passive SI.  Feels very safe with their home life and partner when they have intrusive thoughts.  Safety contracted them.  By therapy and psychiatry closely and patient is being weaned down from their Abilify currently.  They believe also acute vomiting/diarrhea has been contributing to mood as well. -Monitor at future visits -AVS SI resources provided

## 2022-06-23 ENCOUNTER — Ambulatory Visit: Payer: Medicaid Other

## 2022-06-23 ENCOUNTER — Ambulatory Visit (HOSPITAL_COMMUNITY): Payer: 59

## 2022-06-28 ENCOUNTER — Encounter: Payer: Self-pay | Admitting: *Deleted

## 2022-06-28 ENCOUNTER — Other Ambulatory Visit: Payer: Self-pay | Admitting: Student

## 2022-06-28 DIAGNOSIS — R1115 Cyclical vomiting syndrome unrelated to migraine: Secondary | ICD-10-CM

## 2022-06-28 MED ORDER — PROMETHAZINE HCL 25 MG PO TABS
25.0000 mg | ORAL_TABLET | Freq: Three times a day (TID) | ORAL | 0 refills | Status: DC | PRN
Start: 2022-06-28 — End: 2022-07-20

## 2022-06-28 NOTE — Progress Notes (Signed)
Discussed with attending Dr. Jennette Kettle about patient's MRI being denied by insurance. Dr. Jennette Kettle recommended neurology referral for further evaluation of this patient. I call patient today and confirmed neurology referral would be ok with her and they agreed. Amb referral placed in orders.

## 2022-06-29 ENCOUNTER — Ambulatory Visit (HOSPITAL_COMMUNITY): Payer: 59

## 2022-06-29 DIAGNOSIS — F410 Panic disorder [episodic paroxysmal anxiety] without agoraphobia: Secondary | ICD-10-CM | POA: Diagnosis not present

## 2022-06-29 DIAGNOSIS — F3181 Bipolar II disorder: Secondary | ICD-10-CM | POA: Diagnosis not present

## 2022-06-29 DIAGNOSIS — R69 Illness, unspecified: Secondary | ICD-10-CM | POA: Diagnosis not present

## 2022-07-05 ENCOUNTER — Encounter: Payer: Self-pay | Admitting: Student

## 2022-07-13 NOTE — Telephone Encounter (Signed)
Patient calls nurse line requesting a FU apt with PCP.   Patient reports she can only come in on Thursdays. PCP does not availability for a Thursday until January. Patient reports she does not want to wait that long as her symptoms have not improved.   Patient scheduled for 12/7.

## 2022-07-19 NOTE — Progress Notes (Signed)
SUBJECTIVE:   CHIEF COMPLAINT / HPI:   Vomiting, blurry vision, and headache  Patient seen about a month ago for vomiting and diarrhea.  Also admitted to blurry vision for past few months and headache.  Patient does take edibles and was counseled on the possibility of cyclical vomiting syndrome last visit and previously by psychiatrist.  At last visit, capsaicin cream was prescribed which patient states sometimes helped but sometimes just made skin red and hot.  An MRI was ordered to rule out intracranial causes of vomiting blurry vision which was not improved and not completed.  Neurology referral was also placed but denied.  Today pt states they are not having blurry vision but changes in vision for past 6 months where far away objects are harder to see. Has been over a year since they are gone to the eye doctor, does not have vision insurance. Still having headaches every couple days, usually mild. Only takes tylenol for severe headaches which helps.   Pt states she last vomited on 4 days ago which is an improvement, typically vomiting occurs daily. Last smoked marijuana on Saturday. Still having diarrhea multiple times a day everyday for past year. This past week end, diarrhea was so bad pt couldn't not make it to the bathroom. This past weekend also had severe throbbing headache, worse with light and sound, caused pt to vomit.  Acne Battled with acne for years which patient states lowers self-confidence. Requests to be seen by dermatology. Is open to being seen by our skin clinic first.   PERTINENT  PMH / PSH:  MDD, history of bulimia, anxiety  OBJECTIVE:   Vitals:   07/20/22 1457  BP: 107/80  Pulse: 81  Temp: 98.6 F (37 C)  SpO2: 98%    General: NAD, pleasant, able to participate in exam Cardiac: RRR, no murmurs. Respiratory: CTAB, normal effort, No wheezes, rales or rhonchi Abdomen: Bowel sounds present, nontender, nondistended, soft Neuro: alert, no obvious focal  deficits Psych: Normal affect and mood  ASSESSMENT/PLAN:   Persistent recurrent vomiting Likely caused by cyclic vomiting syndrome which patient is aware of.  We also discussed that marijuana likely exacerbates this, could be a component of cannabinoid hyperemesis syndrome.  Vomiting episodes seem to have lessened since patient stopped smoking marijuana.  Patient is advised to stop smoking marijuana to see if this will continue to reduce vomiting episodes. Patient was previously ordered to have an MRI and neurology referral due to the vomiting being accompanied by recurrent headaches and blurred vision.  After discussing this further with patient it does not sound like they are having blurred vision as much as difficulty seeing faraway which could be due to not having a current eyeglasses prescription.  Headaches more likely a result of stress, recurrent vomiting and not having updated eyeglasses prescription. Will check CMP today due to previously elevated liver enzymes and will also check electrolytes due to diarrhea and persistent vomiting.  Acne Patient requests referral to dermatology, is open to be seen in our Derm clinic as this will likely be a faster option.  We did not fully discuss acne/what patient has or has not tried at this visit due to time.  Patient is scheduled for Derm clinic on 12/14.   MDD (major depressive disorder), recurrent episode, severe ([facility]) Patient continues to have passive SI.  Denies any active plans.  States they talk to their partner when they are feeling this way.  Is already established with psychiatry.  Patient feels  very safe at home.  Elevated liver enzymes -Mildly elevated ALT on previous lab a month ago.  Will recheck today -CMP     Dr. Erick Alley, DO [facility]

## 2022-07-20 ENCOUNTER — Ambulatory Visit (INDEPENDENT_AMBULATORY_CARE_PROVIDER_SITE_OTHER): Payer: 59 | Admitting: Student

## 2022-07-20 ENCOUNTER — Encounter: Payer: Self-pay | Admitting: Student

## 2022-07-20 VITALS — BP 107/80 | HR 81 | Temp 98.6°F | Wt 170.6 lb

## 2022-07-20 DIAGNOSIS — R748 Abnormal levels of other serum enzymes: Secondary | ICD-10-CM | POA: Diagnosis not present

## 2022-07-20 DIAGNOSIS — L709 Acne, unspecified: Secondary | ICD-10-CM | POA: Diagnosis not present

## 2022-07-20 DIAGNOSIS — R1115 Cyclical vomiting syndrome unrelated to migraine: Secondary | ICD-10-CM

## 2022-07-20 DIAGNOSIS — R69 Illness, unspecified: Secondary | ICD-10-CM | POA: Diagnosis not present

## 2022-07-20 DIAGNOSIS — F332 Major depressive disorder, recurrent severe without psychotic features: Secondary | ICD-10-CM

## 2022-07-20 MED ORDER — PROMETHAZINE HCL 25 MG PO TABS: 25.0000 mg | ORAL_TABLET | Freq: Three times a day (TID) | ORAL | 0 refills | Status: DC | PRN

## 2022-07-20 NOTE — Patient Instructions (Signed)
It was great to see you! Thank you for allowing me to participate in your care!  Our plans for today:  - We will schedule you with our dermatology clinic - Refill for phenergan sent to your pharmacy  We are checking some labs today, I will call you if they are abnormal will send you a MyChart message or a letter if they are normal.  If you do not hear about your labs in the next 2 weeks please let us know.  Take care and seek immediate care sooner if you develop any concerns.   Dr. Erick Alley, DO [facility]

## 2022-07-21 DIAGNOSIS — L700 Acne vulgaris: Secondary | ICD-10-CM | POA: Insufficient documentation

## 2022-07-21 DIAGNOSIS — R748 Abnormal levels of other serum enzymes: Secondary | ICD-10-CM | POA: Insufficient documentation

## 2022-07-21 DIAGNOSIS — L709 Acne, unspecified: Secondary | ICD-10-CM | POA: Insufficient documentation

## 2022-07-21 LAB — COMPREHENSIVE METABOLIC PANEL
ALT: 18 IU/L (ref 0–32)
AST: 19 IU/L (ref 0–40)
Albumin/Globulin Ratio: 2.2 (ref 1.2–2.2)
Albumin: 4.7 g/dL (ref 3.9–4.9)
Alkaline Phosphatase: 97 IU/L (ref 44–121)
BUN/Creatinine Ratio: 8 — ABNORMAL LOW (ref 9–23)
BUN: 5 mg/dL — ABNORMAL LOW (ref 6–20)
Bilirubin Total: 0.7 mg/dL (ref 0.0–1.2)
CO2: 20 mmol/L (ref 20–29)
Calcium: 9.7 mg/dL (ref 8.7–10.2)
Chloride: 103 mmol/L (ref 96–106)
Creatinine, Ser: 0.62 mg/dL (ref 0.57–1.00)
Globulin, Total: 2.1 g/dL (ref 1.5–4.5)
Glucose: 115 mg/dL — ABNORMAL HIGH (ref 70–99)
Potassium: 4.1 mmol/L (ref 3.5–5.2)
Sodium: 138 mmol/L (ref 134–144)
Total Protein: 6.8 g/dL (ref 6.0–8.5)
eGFR: 121 mL/min/{1.73_m2} (ref >59)

## 2022-07-21 NOTE — Assessment & Plan Note (Signed)
-  Mildly elevated ALT on previous lab a month ago.  Will recheck today -CMP

## 2022-07-21 NOTE — Assessment & Plan Note (Signed)
Patient requests referral to dermatology, is open to be seen in our Derm clinic as this will likely be a faster option.  We did not fully discuss acne/what patient has or has not tried at this visit due to time.  Patient is scheduled for Derm clinic on 12/14.

## 2022-07-21 NOTE — Assessment & Plan Note (Addendum)
Likely caused by cyclic vomiting syndrome which patient is aware of.  We also discussed that marijuana likely exacerbates this, could be a component of cannabinoid hyperemesis syndrome.  Vomiting episodes seem to have lessened since patient stopped smoking marijuana.  Patient is advised to stop smoking marijuana to see if this will continue to reduce vomiting episodes. Patient was previously ordered to have an MRI and neurology referral due to the vomiting being accompanied by recurrent headaches and blurred vision.  After discussing this further with patient it does not sound like they are having blurred vision as much as difficulty seeing faraway which could be due to not having a current eyeglasses prescription.  Headaches more likely a result of stress, recurrent vomiting and not having updated eyeglasses prescription. Will check CMP today due to previously elevated liver enzymes and will also check electrolytes due to diarrhea and persistent vomiting.

## 2022-07-21 NOTE — Assessment & Plan Note (Signed)
Patient continues to have passive SI.  Denies any active plans.  States they talk to their partner when they are feeling this way.  Is already established with psychiatry.  Patient feels very safe at home.

## 2022-07-27 ENCOUNTER — Ambulatory Visit (INDEPENDENT_AMBULATORY_CARE_PROVIDER_SITE_OTHER): Payer: 59 | Admitting: Family Medicine

## 2022-07-27 VITALS — BP 101/87 | Ht 61.0 in | Wt 170.6 lb

## 2022-07-27 DIAGNOSIS — L700 Acne vulgaris: Secondary | ICD-10-CM

## 2022-07-27 DIAGNOSIS — L709 Acne, unspecified: Secondary | ICD-10-CM | POA: Diagnosis not present

## 2022-07-27 MED ORDER — BENZOYL PEROXIDE 6.5 % EX GEL: 1.0000 | Freq: Every day | CUTANEOUS | 0 refills | Status: DC

## 2022-07-27 NOTE — Progress Notes (Signed)
    SUBJECTIVE:   CHIEF COMPLAINT / HPI:   RW is a 31yo AFAB gender fluid pt that presents to [facility] clinic for acne. Has acne since teens, but never did anything for it. Diffusely on face, especially along the chin. Cystic in nature. Wash face once a day (no active ingredient), no moisturizer.  Currently not having periods, has mireena iud for 36yrs.   PERTINENT  PMH / PSH: MDD, PTSD, tobacco use  OBJECTIVE:   BP 101/87   Ht 5\' 1"  (1.549 m)   Wt 170 lb 9.6 oz (77.4 kg)   SpO2 100%   BMI 32.23 kg/m   Gen: Pleasant, alert person. NAD. HEENT:NCAT. MMM. Resp: Normal WOB on RA Derm: Erythematous papules diffusely on face, especially along chin c/w acne.  ASSESSMENT/PLAN:   Acne vulgaris Has chronic hx of acne since teens, never treated. Currently not using any active ingredients. Opnly uses a facial cleanser once a day, no moisturizer. - Start Benzoyl peroxide 6.5% daily - Wash face daily with gentle cleanser - Apply moisturizer to avoid dryness - Apply sunscreen spf 30 or more - f/u with PCP in 2 months. Can increase benzoyl peroxide or add retinol (adapalene, retin A)   , MD [facility]

## 2022-07-27 NOTE — Patient Instructions (Signed)
Good to see you today - Thank you for coming in  Things we discussed today:  1) For your Acne - Wash your face daily with a gentle cleanser (Cetaphil or Cerave) - Use Benzoyl Peroxide once a day on your face.  - This will be drying. Make sure to apply a moisturizer or lotion on your face.  - Apply a sunscreen daily (spf 30 or greater)   Please always bring your medication bottles  Come back to see your PCP in 2 months if your acne is not improving.

## 2022-07-28 NOTE — Assessment & Plan Note (Signed)
Has chronic hx of acne since teens, never treated. Currently not using any active ingredients. Opnly uses a facial cleanser once a day, no moisturizer. - Start Benzoyl peroxide 6.5% daily - Wash face daily with gentle cleanser - Apply moisturizer to avoid dryness - Apply sunscreen spf 30 or more - f/u with PCP in 2 months. Can increase benzoyl peroxide or add retinol (adapalene, retin A)

## 2022-08-10 ENCOUNTER — Ambulatory Visit: Payer: 59 | Admitting: Family Medicine

## 2022-08-10 ENCOUNTER — Other Ambulatory Visit: Payer: Self-pay

## 2022-08-10 ENCOUNTER — Ambulatory Visit (INDEPENDENT_AMBULATORY_CARE_PROVIDER_SITE_OTHER): Payer: 59 | Admitting: Family Medicine

## 2022-08-10 ENCOUNTER — Encounter: Payer: Self-pay | Admitting: Family Medicine

## 2022-08-10 VITALS — BP 106/76 | HR 97 | Wt 169.0 lb

## 2022-08-10 DIAGNOSIS — F410 Panic disorder [episodic paroxysmal anxiety] without agoraphobia: Secondary | ICD-10-CM | POA: Diagnosis not present

## 2022-08-10 DIAGNOSIS — H9202 Otalgia, left ear: Secondary | ICD-10-CM | POA: Diagnosis not present

## 2022-08-10 DIAGNOSIS — R69 Illness, unspecified: Secondary | ICD-10-CM | POA: Diagnosis not present

## 2022-08-10 DIAGNOSIS — F3181 Bipolar II disorder: Secondary | ICD-10-CM | POA: Diagnosis not present

## 2022-08-10 NOTE — Assessment & Plan Note (Addendum)
L ear pain associated with jaw clicking and headaches x 10 days. TMs clear but TTP over L TMJ. Minimal sore throat and congestion, less likely eustation tube dysfunction but could be contributing to symptoms. 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 Advised supportive care with heat, massage, NSAIDs and stress management. Has not seen dentist since 2015, list of low cost/free dental clinics given. Return to care if worsening symptoms or locking up of the jaw.

## 2022-08-10 NOTE — Progress Notes (Signed)
    SUBJECTIVE:   CHIEF COMPLAINT / HPI:   L ear pain: Began around 10 days, attempted to go to Urgent Care but insurance would not cover it. Associated with headache, popping/clicking of the jaw, pain with eating, "whooshing" sound and occasional tinnitus. Endorses mild sore throat and congestion but "not enough to even be a cold". Denies fever, cough. Last ear infection in childhood, none recently. States has not seen a dentist since 2015.  PERTINENT  PMH / PSH: None  OBJECTIVE:   BP 106/76   Pulse 97   Wt 169 lb (76.7 kg)   SpO2 99%   BMI 31.93 kg/m    General: NAD, pleasant, able to participate in exam HEENT: Tender to palpation over L TMJ, TM pearly without bulging, erythema or purulent drainage bilaterally. Mild hypertonicity of masseter and temporalis muscles.   ASSESSMENT/PLAN:   Ear pain, left L ear pain associated with jaw clicking and headaches x 10 days. TMs clear but TTP over L TMJ. Minimal sore throat and congestion, less likely eustation tube dysfunction but could be contributing to symptoms. 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 Advised supportive care with heat, massage, NSAIDs and stress management. Has not seen dentist since 2015, list of low cost/free dental clinics given. Return to care if worsening symptoms or locking up of the jaw.    Dr. Elberta Fortis, DO [facility]

## 2022-08-10 NOTE — Patient Instructions (Addendum)
It was wonderful to see you today! Thank you for choosing [facility].   Please bring ALL of your medications with you to every visit.   Today we talked about:  Your ear pain is likely coming from TMJ dysfunction that can also cause popping and clicking of the jaw. Massage, heat and NSAIDs such as Ibuprofen can help with the discomfort. The pain is typically triggered by stress and can sometimes be improved with stress reduction. I do recommend you follow up with a dentist to further assess your symptoms and for general dental care  Please follow up as needed for worsening symptoms.  If you haven't already, sign up for My Chart to have easy access to your labs results, and communication with your primary care physician.   We are checking some labs today. If they are abnormal, I will call you. If they are normal, I will send you a MyChart message (if it is active) or a letter in the mail. If you do not hear about your labs in the next 2 weeks, please call the office.  Call the clinic at [phone] if your symptoms worsen or you have any concerns.  Please be sure to schedule follow up at the front desk before you leave today.   Elberta Fortis, DO Family Medicine    Low cost/free dental care: [facility], applications [employer]) Dental Clinic:   [phone] x [phone] Onsite , Accepting new patients

## 2022-08-29 ENCOUNTER — Telehealth: Payer: Self-pay

## 2022-08-29 NOTE — Telephone Encounter (Signed)
Patient calls nurse line requesting a letter for work.  She reports she has missed the last 2 days for flu like symptoms.   Patient advised she would need to be seen before we could write her note.   Patient reports she will try and a find a ride and give the clinic a call back to schedule if able.   Patient advises UC is an option as well as she lives in [place].

## 2022-09-21 DIAGNOSIS — G4701 Insomnia due to medical condition: Secondary | ICD-10-CM | POA: Diagnosis not present

## 2022-09-21 DIAGNOSIS — R69 Illness, unspecified: Secondary | ICD-10-CM | POA: Diagnosis not present

## 2022-10-13 IMAGING — CT CT ABD-PELV W/ CM
2 of 4 series · 17 of 46 positions shown, 19 images · IV contrast (OMNIPAQUE 300)
Comparison: None.

CLINICAL DATA: Nausea and vomiting.  Chronic nausea

EXAM:
CT ABDOMEN AND PELVIS WITH CONTRAST
TECHNIQUE: Multidetector CT imaging of the abdomen and pelvis was performed
using the standard protocol following bolus administration of
intravenous contrast.
CONTRAST:  100mL OMNIPAQUE IOHEXOL 300 MG/ML  SOLN

[Series 2: abd/pel w · axial · 0.63mm/px · z∈[+980,+1405]mm · 14 of 93 slices shown, 16 images]
[im 4/93  soft-tissue]
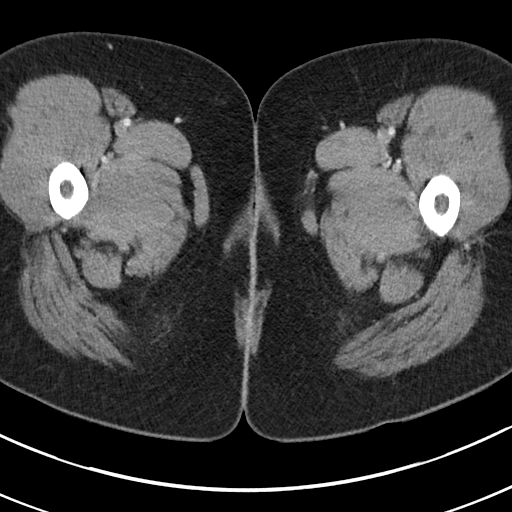
[im 4/93  bone]
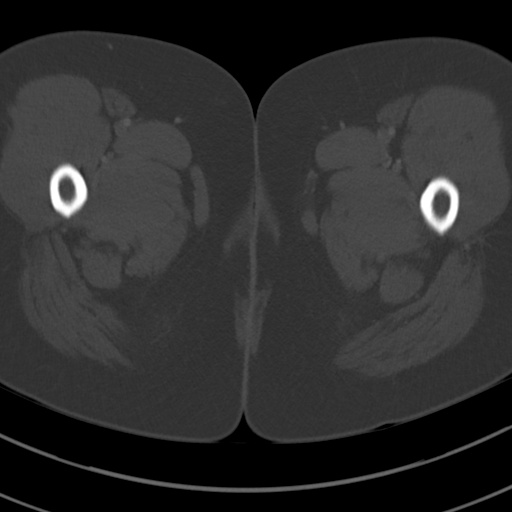
[im 12/93  soft-tissue]
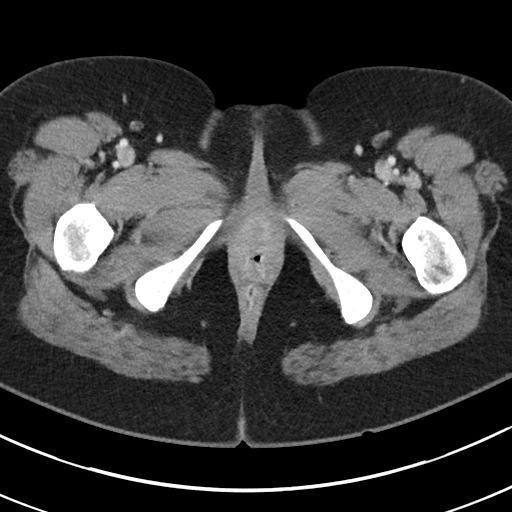
[im 19/93  soft-tissue]
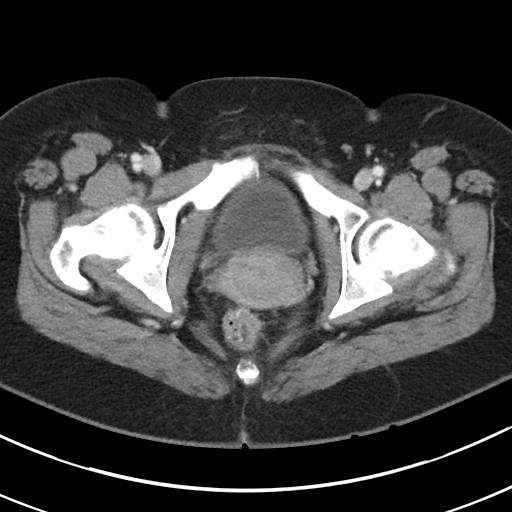
[im 26/93  soft-tissue]
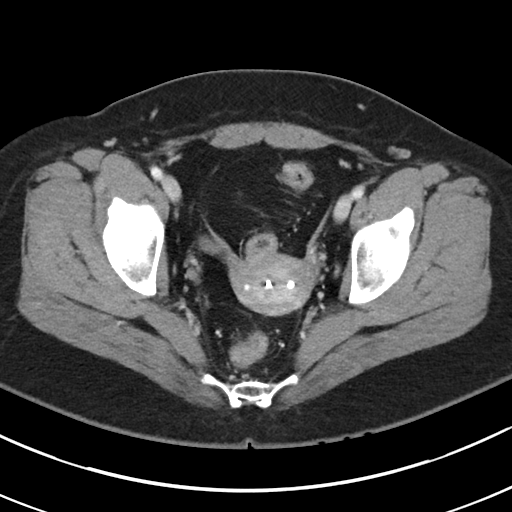
[im 30/93  soft-tissue]
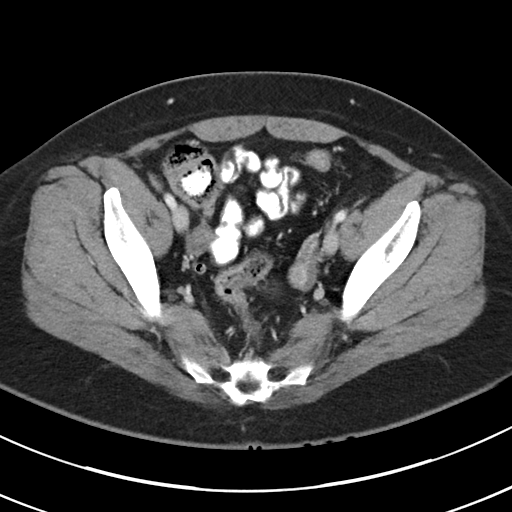
[im 37/93  soft-tissue]
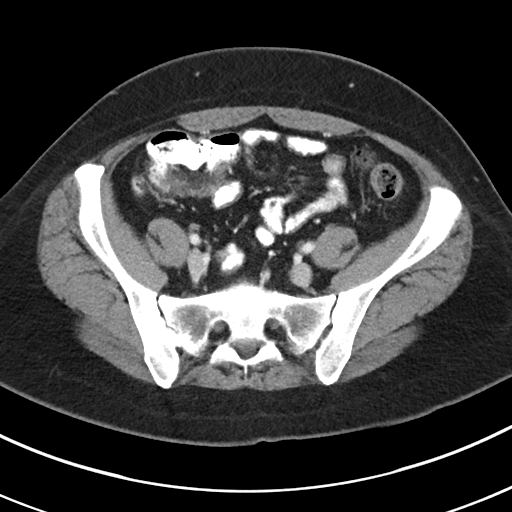
[im 45/93  soft-tissue]
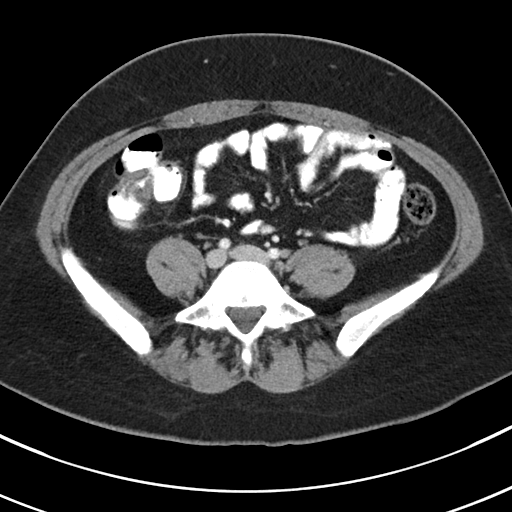
[im 48/93  soft-tissue]
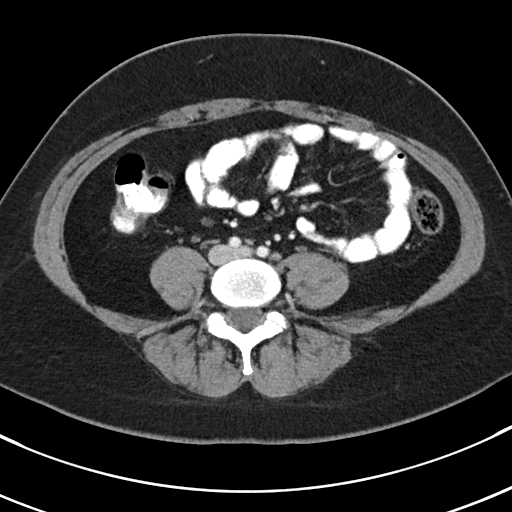
[im 56/93  soft-tissue]
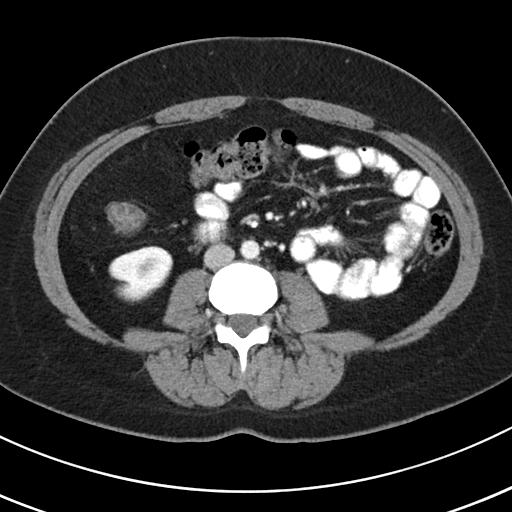
[im 56/93  bone]
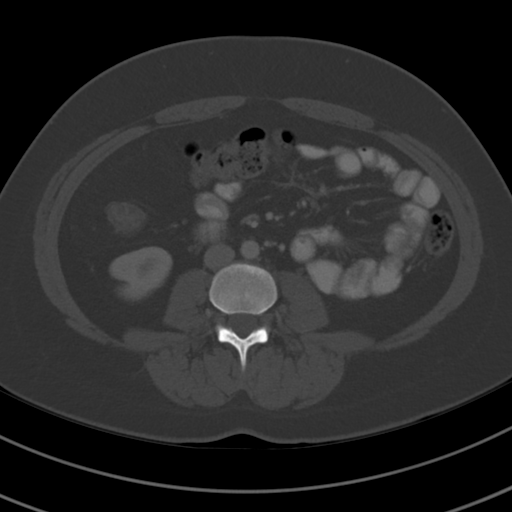
[im 63/93  soft-tissue]
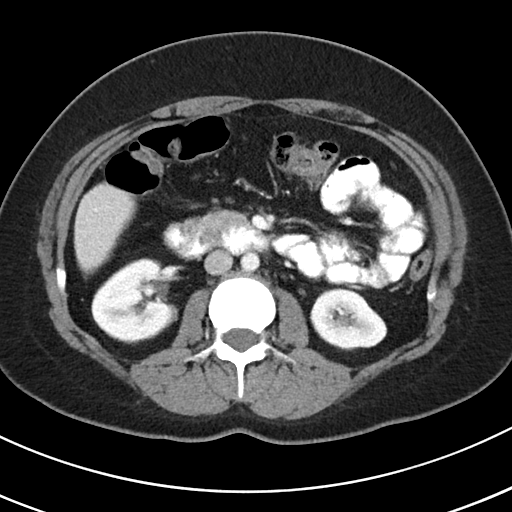
[im 70/93  soft-tissue]
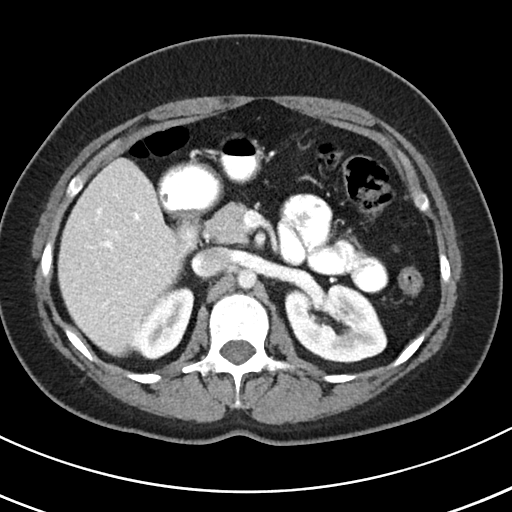
[im 74/93  soft-tissue]
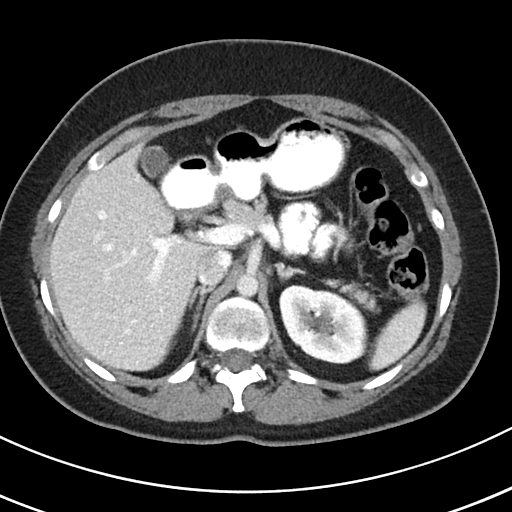
[im 81/93  soft-tissue]
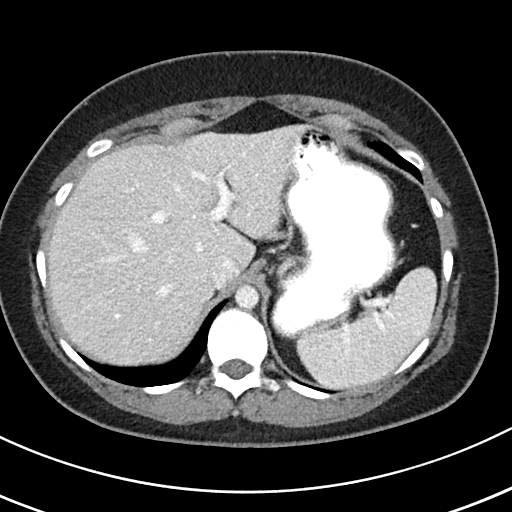
[im 89/93  soft-tissue]
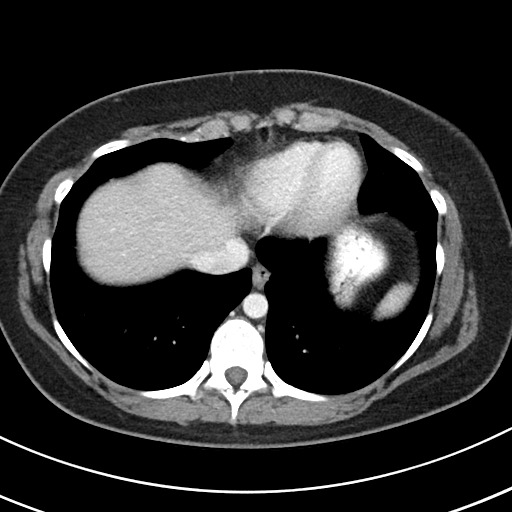

[Series 5: coronal st · coronal · 0.84mm/px · 3 of 93 slices shown]
[im 31/93  soft-tissue]
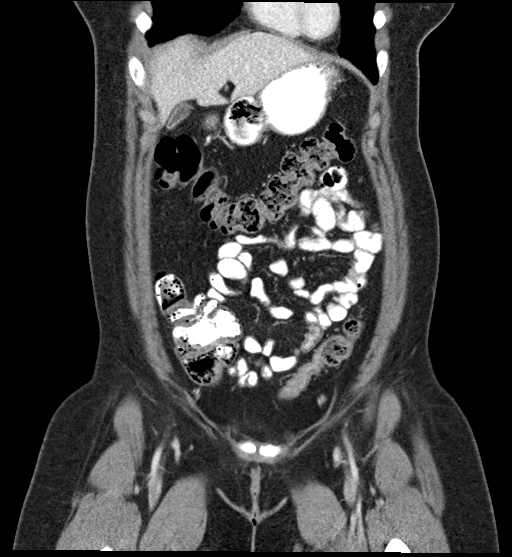
[im 41/93  soft-tissue]
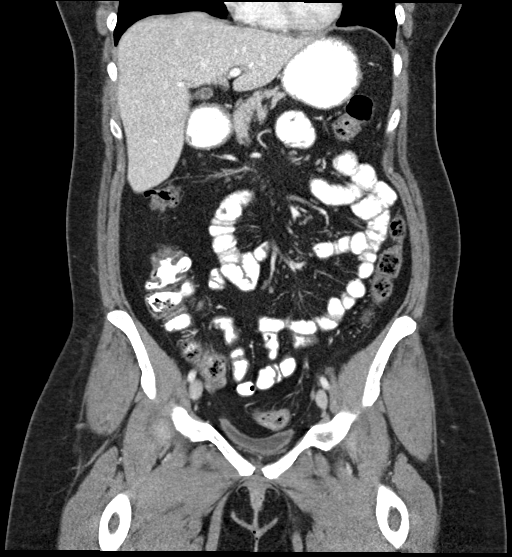
[im 52/93  soft-tissue]
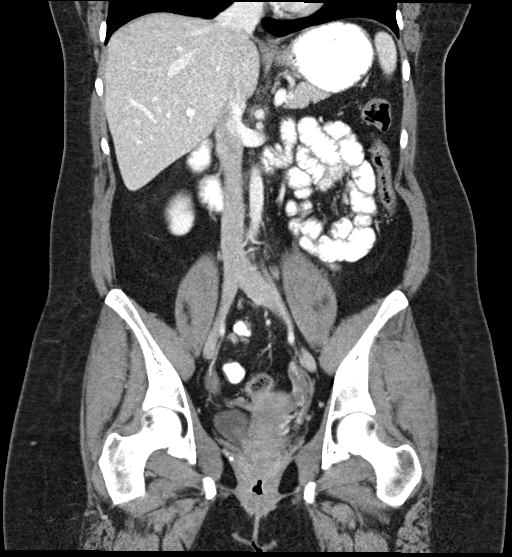

[17 of 46 positions shown; findings below may reference images not displayed]

FINDINGS: Lower chest: Lung bases are clear.

Hepatobiliary: No focal hepatic lesion. No biliary duct dilatation.
Common bile duct is normal.

Pancreas: Pancreas is normal. No ductal dilatation. No pancreatic
inflammation.

Spleen: Normal spleen

Adrenals/urinary tract: Adrenal glands and kidneys are normal. The
ureters and bladder normal.

Stomach/Bowel: Stomach, small bowel, appendix, and cecum are normal.
The colon and rectosigmoid colon are normal.

Vascular/Lymphatic: Abdominal aorta is normal caliber. No periportal
or retroperitoneal adenopathy. No pelvic adenopathy.

Reproductive: IUD in expected location the uterus. Ovaries normal.
Enhancing follicle of the LEFT ovary.

Other: No free fluid.

Musculoskeletal: No aggressive osseous lesion.
IMPRESSION: 1. No acute findings in the abdomen pelvis.
2. Normal gallbladder and appendix.  No obstructive uropathy.
3. No bowel obstruction or inflammation.

## 2022-11-16 DIAGNOSIS — G4701 Insomnia due to medical condition: Secondary | ICD-10-CM | POA: Diagnosis not present

## 2022-11-16 DIAGNOSIS — F17200 Nicotine dependence, unspecified, uncomplicated: Secondary | ICD-10-CM | POA: Diagnosis not present

## 2022-11-16 DIAGNOSIS — F3181 Bipolar II disorder: Secondary | ICD-10-CM | POA: Diagnosis not present

## 2022-11-16 DIAGNOSIS — F1210 Cannabis abuse, uncomplicated: Secondary | ICD-10-CM | POA: Diagnosis not present

## 2022-11-16 DIAGNOSIS — F1011 Alcohol abuse, in remission: Secondary | ICD-10-CM | POA: Diagnosis not present

## 2022-11-29 ENCOUNTER — Ambulatory Visit
Admission: RE | Admit: 2022-11-29 | Discharge: 2022-11-29 | Disposition: A | Discharge status: Home / Self Care | Payer: 59 | Source: Ambulatory Visit

## 2022-11-29 VITALS — BP 126/87 | HR 116 | Temp 98.2°F | Resp 16

## 2022-11-29 DIAGNOSIS — L03114 Cellulitis of left upper limb: Secondary | ICD-10-CM

## 2022-11-29 MED ORDER — AMOXICILLIN-POT CLAVULANATE 875-125 MG PO TABS: 1.0000 | ORAL_TABLET | Freq: Two times a day (BID) | ORAL | 0 refills | Status: AC

## 2022-11-29 NOTE — ED Triage Notes (Signed)
Patient presents to UC for left index finger swelling and redness since last night. She states worse today. Pt reports her finger is always popping. Has not taken anything for pain. Pt also reports she has a mass/nodule on her left thigh. States it has been there 10 years. She does have a pcp and has never mentioned. No pain to thigh.

## 2022-11-29 NOTE — Discharge Instructions (Signed)
For treatment of possible cellulitis, prescription for Augmentin has been sent to your pharmacy.  Please take 1 tablet twice daily for the next 5 days.  Please monitor your hand for signs of increasing redness or swelling.  If these occur despite treatment with Augmentin, please go to the emergency room for further evaluation.  Thank you for visiting urgent care today.
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
## 2022-11-30 ENCOUNTER — Ambulatory Visit (INDEPENDENT_AMBULATORY_CARE_PROVIDER_SITE_OTHER): Payer: 59 | Admitting: Student

## 2022-11-30 ENCOUNTER — Ambulatory Visit
Admission: RE | Admit: 2022-11-30 | Discharge: 2022-11-30 | Disposition: A | Discharge status: Home / Self Care | Payer: 59 | Source: Ambulatory Visit | Attending: Family Medicine | Admitting: Family Medicine

## 2022-11-30 VITALS — BP 122/85 | HR 95 | Temp 98.2°F | Ht 61.0 in | Wt 172.4 lb

## 2022-11-30 DIAGNOSIS — M79645 Pain in left finger(s): Secondary | ICD-10-CM

## 2022-11-30 DIAGNOSIS — Z720 Tobacco use: Secondary | ICD-10-CM | POA: Diagnosis not present

## 2022-11-30 DIAGNOSIS — M79642 Pain in left hand: Secondary | ICD-10-CM | POA: Diagnosis not present

## 2022-11-30 NOTE — Assessment & Plan Note (Signed)
Patient note's she smokes 1 ppd, and would like to quit. Has tried patch in past and was able to quit, but then restarted and developed rash to patch. Patient does not chew gum. Patient wanting to start medication to quit. Will recommend patient schedule appt w/ Dr. Raymondo Band as he can start medicine and follow up as needed. -Refer to Dr. Raymondo Band for smoking cessation

## 2022-11-30 NOTE — Progress Notes (Signed)
  SUBJECTIVE:   CHIEF COMPLAINT / HPI:   Left Hand Pain Index finger will pop, and has been an issue always. Now it's popping more. Started swelling on Tuesday, got worse on Wednesday and was hard to use hand. Got Hand x-ray that showed soft tissue swelling. Is also noting mild pain rated 2-3/10. Denies any fever's but has hx of Nausea/vomiting/diarrhea that was worked up and negative. No hx of trauma on hands  Smoking cessation Smokes a pack a day and is wanting to quit. Has tried patch before and it worked once, she quit, and then restarted. Next time using patch caused a huge rash.   PERTINENT  PMH / PSH:     Patient Care Team: Cora Collum, DO as PCP - General (Family Medicine) OBJECTIVE:  BP 122/85   Pulse 95   Temp 98.2 F (36.8 C)   Ht  (1.549 m)   Wt 172 lb 6.4 oz (78.2 kg)   SpO2 100%   BMI 32.57 kg/m  Physical Exam Constitutional:      General: Beth CUMPTON "[name]" is not in acute distress.    Appearance: Normal appearance. Beth Rua "[name]" is not ill-appearing, toxic-appearing or diaphoretic.  Musculoskeletal:     Right hand: Normal.     Left hand: Tenderness and bony tenderness present. No swelling, deformity or lacerations. Normal range of motion. Normal strength.     Comments: Slight erythema on left index finger, dorsal surface, overlying joint.  Neurological:     Mental Status: Beth ZEITER "[name]" is alert.      ASSESSMENT/PLAN:  Pain in finger of left hand Assessment & Plan: Patient notes swelling and pain in left index finger that started Tuesday and got worse Wednsday. 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 She followed up for further eval. She has FROM in index finger, with some TTP at the MCP joint. Patient used pen to mark boundaries of erythema which has significantly decreased today/mostly resolved w/  slight erythema overlying MCP joint. Low concern for septic joint given lack in severity of pain, no warm joint, and patient otherwise feeling well. Low concern for Rheum condition as only this joint is affected. Patient likely has cellulitis, will obtain imaging to determine if more cause for concern.  -Hand X-ray complete -Continue abx  Orders: -     DG Hand Complete Left; Future  Tobacco use Assessment & Plan: Patient note's she smokes 1 ppd, and would like to quit. Has tried patch in past and was able to quit, but then restarted and developed rash to patch. Patient does not chew gum. Patient wanting to start medication to quit. Will recommend patient schedule appt w/ Dr. Raymondo Band as he can start medicine and follow up as needed. -Refer to Dr. Raymondo Band for smoking cessation    No follow-ups on file. Bess Kinds, MD 11/30/2022, 11:55 AM PGY-2,  Family Medicine

## 2022-11-30 NOTE — Assessment & Plan Note (Addendum)
Patient notes swelling and pain in left index finger that started Tuesday and got worse Wednsday. 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 She followed up for further eval. She has FROM in index finger, with some TTP at the MCP joint. Patient used pen to mark boundaries of erythema which has significantly decreased today/mostly resolved w/ slight erythema overlying MCP joint. Low concern for septic joint given lack in severity of pain, no warm joint, and patient otherwise feeling well. Low concern for Rheum condition as only this joint is affected. Patient likely has cellulitis, will obtain imaging to determine if more cause for concern.  -Hand X-ray complete -Continue abx

## 2022-11-30 NOTE — Patient Instructions (Addendum)
It was great to see you! Thank you for allowing me to participate in your care!  I'm most concerned that your finger pain is coming from a cellulitis, or infection of the soft tissue. But, I want to be sure you don't have something worse happening with the joint.  Our plans for today:  - Finger pain  Hand X-ray, multiple views, MD to read  [facility]   Address: [address] [place], [place], [place] [address] Phone: [phone] Seek emergency medical care if  Swelling, redness, or pain becomes worse  You develop fevers (100.4 or higher)  You develop drainage from finger  - Smoking cessation Schedule appointment w/ Dr. Madelon Lips to meet with him about smoking cessation. He will start meds and see you for follow ups.   - Depression  Make a follow up appointment to discuss your depression    Take care and seek immediate care sooner if you develop any concerns.   Dr. Bess Kinds, MD [facility]

## 2022-12-05 ENCOUNTER — Ambulatory Visit (INDEPENDENT_AMBULATORY_CARE_PROVIDER_SITE_OTHER): Payer: 59 | Admitting: Family Medicine

## 2022-12-05 ENCOUNTER — Ambulatory Visit: Payer: 59 | Attending: Family Medicine

## 2022-12-05 VITALS — BP 124/87 | HR 108 | Ht 61.0 in | Wt 172.5 lb

## 2022-12-05 DIAGNOSIS — M2540 Effusion, unspecified joint: Secondary | ICD-10-CM | POA: Diagnosis not present

## 2022-12-05 DIAGNOSIS — R002 Palpitations: Secondary | ICD-10-CM

## 2022-12-05 NOTE — Progress Notes (Signed)
SUBJECTIVE:   CHIEF COMPLAINT / HPI:   Beth [name] is a 32 y.o. adult who presents to the [facility] clinic today to discuss the following concerns:   F/u Left 2nd Finger Swelling Patient presented to the urgent care on 4/17 and given prescription of Augmentin for presumed cellulitis of her hand. They then followed up in our clinic the following day and a hand x-ray was ordered at that time which returned negative for acute abnormalities.   They have since completed 5-day course of antibiotics but feel that they have had continued symptoms.   [name] states that their first MCP continues to hurt. Finger "pops" often and feels sore. It is uncomfortable to flex their finger but they are able to do so. Seems to be slightly better than last week but not yet resolved.   Also mentions that sometimes they will get pain to b/l hips or right knee. It is a shooting pain that goes down their legs. Intermittent, lasts around 5 minutes but occasionally lasts all night. Thought it was sciatica at first.   Hasn't taken any medication for the discomfort.   Syncope 3-4 weeks ago. Occurred when coming home. Felt dizzy and lightheaded prior to syncope. Was on propranolol and her psychiatrist mention that it may have caused her blood pressure to fall too low.  They have since stopped taking propranolol. [name] notes some palpitations even when at rest. [name] does have anxiety but notes this even when not feeling anxious. Has not had any caffeine today.   PERTINENT  PMH / PSH: Tobacco use disorder   OBJECTIVE:   BP 124/87   Pulse (!) 104   Ht  (1.549 m)   Wt 172 lb 8 oz (78.2 kg)   SpO2 100%   BMI 32.59 kg/m   Vitals:   12/05/22 1008 12/05/22 1044  BP: 124/87   Pulse: (!) 104 (!) 108  SpO2: 100%    General: NAD, pleasant, able to participate in exam Respiratory: normal effort Left Hand: Very mild edema to 2nd finger, without erythema or ecchymosis. Mild ttp to 2nd MCP and PIP joint. Full AROM  in flexion and extension of all fingers.  Skin: warm and dry, no rashes noted Psych: Normal affect and mood     12/05/2022   10:08 AM 11/30/2022   10:35 AM 07/20/2022    3:18 PM  Depression screen PHQ 2/9  Decreased Interest Down, Depressed, Hopeless PHQ - 2 Score Altered sleeping Tired, decreased energy Change in appetite Feeling bad or failure about yourself  Trouble concentrating Moving slowly or fidgety/restless Suicidal thoughts 0 0 1  PHQ-9 Score Difficult doing work/chores Very difficult Very difficult Very difficult   ASSESSMENT/PLAN:   1. Joint swelling Without known injury. Isolated swelling and discomfort to 2nd digit. Negative x-rays. Has already completed 5-day course of abx, does not seem infected at this time. Consider rheumatologic cause of edema, will check labs. If positive, consider Rheum referral. If negative, likely fine to monitor for now as symptoms seem to be improving with supportive measures.  - Sedimentation Rate - C-reactive protein - Rheumatoid factor - ANA, IFA (with reflex)  2. Palpitations Tachycardic today. Has been intermittently tachycardic on review. Given symptoms of intermittent palpations will order [facility]  patch to assess for PVC or other arrhythmia. Was on propranolol in the past but off of it now, may benefit from re initiation in the future.  - LONG TERM MONITOR (3-14 DAYS); Future  PHQ-9 score elevated at 15. No SI. Patient states that mood is improved since starting a new medication.    Sabino Dick, DO [facility]

## 2022-12-05 NOTE — Progress Notes (Unsigned)
Enrolled for [employer] to mail a ZIO XT long term holter monitor to the patients address on file.   DOD to read. 

## 2022-12-05 NOTE — Patient Instructions (Addendum)
It was wonderful to see you today.  Please bring ALL of your medications with you to every visit.   Today we talked about:  We are doing lab work today to check for inflammatory markers and checking rheumatoid labs. I will send you a MyChart message if you have MyChart. If abnormal, next step would be a referral to Rheumatology.   I am not sure what is causing this but it is reassuring that things are slowly getting better and not worse.  You can take Tylenol as needed for pain. Icing may help calm down any swelling as well.   I have ordered a Zio patch. The cardiology team will message you details about this.   Thank you for coming to your visit as scheduled. We have had a large "no-show" problem lately, and this significantly limits our ability to see and care for patients. As a friendly reminder- if you cannot make your appointment please call to cancel. We do have a no show policy for those who do not cancel within 24 hours. Our policy is that if you miss or fail to cancel an appointment within 24 hours, 3 times in a 29-month period, you may be dismissed from our clinic.   Thank you for choosing [facility].   Please call [phone] with any questions about today's appointment.  Please be sure to schedule follow up at the front  desk before you leave today.   Sabino Dick, DO PGY-3 Family Medicine

## 2022-12-06 ENCOUNTER — Telehealth: Payer: Self-pay | Admitting: Student

## 2022-12-06 ENCOUNTER — Encounter: Payer: Self-pay | Admitting: Student

## 2022-12-06 NOTE — Telephone Encounter (Signed)
Called to inform patient that her hand x-ray was normal, and showed no concerns. Provider wanting to see if finger swelling is resolved. If not/patient still having symptoms, patient should make a f/u appointment.

## 2022-12-09 LAB — RHEUMATOID FACTOR: Rheumatoid fact SerPl-aCnc: <10 IU/mL (ref <14.0)

## 2022-12-09 LAB — ANTINUCLEAR ANTIBODIES, IFA: ANA Titer 1: NEGATIVE

## 2022-12-09 LAB — SEDIMENTATION RATE: Sed Rate: 13 mm/hr (ref 0–32)

## 2022-12-09 LAB — C-REACTIVE PROTEIN: CRP: 8 mg/L (ref 0–10)

## 2022-12-10 DIAGNOSIS — R002 Palpitations: Secondary | ICD-10-CM

## 2022-12-19 DIAGNOSIS — R002 Palpitations: Secondary | ICD-10-CM | POA: Diagnosis not present

## 2022-12-28 DIAGNOSIS — F1011 Alcohol abuse, in remission: Secondary | ICD-10-CM | POA: Diagnosis not present

## 2022-12-28 DIAGNOSIS — F1210 Cannabis abuse, uncomplicated: Secondary | ICD-10-CM | POA: Diagnosis not present

## 2022-12-28 DIAGNOSIS — F3181 Bipolar II disorder: Secondary | ICD-10-CM | POA: Diagnosis not present

## 2022-12-28 DIAGNOSIS — G4701 Insomnia due to medical condition: Secondary | ICD-10-CM | POA: Diagnosis not present

## 2022-12-28 DIAGNOSIS — F17200 Nicotine dependence, unspecified, uncomplicated: Secondary | ICD-10-CM | POA: Diagnosis not present

## 2022-12-28 DIAGNOSIS — F902 Attention-deficit hyperactivity disorder, combined type: Secondary | ICD-10-CM | POA: Diagnosis not present

## 2023-02-01 DIAGNOSIS — F1011 Alcohol abuse, in remission: Secondary | ICD-10-CM | POA: Diagnosis not present

## 2023-02-01 DIAGNOSIS — F17200 Nicotine dependence, unspecified, uncomplicated: Secondary | ICD-10-CM | POA: Diagnosis not present

## 2023-02-01 DIAGNOSIS — F3181 Bipolar II disorder: Secondary | ICD-10-CM | POA: Diagnosis not present

## 2023-02-01 DIAGNOSIS — F1210 Cannabis abuse, uncomplicated: Secondary | ICD-10-CM | POA: Diagnosis not present

## 2023-03-01 DIAGNOSIS — F3181 Bipolar II disorder: Secondary | ICD-10-CM | POA: Diagnosis not present

## 2023-03-01 DIAGNOSIS — F1210 Cannabis abuse, uncomplicated: Secondary | ICD-10-CM | POA: Diagnosis not present

## 2023-03-01 DIAGNOSIS — G4701 Insomnia due to medical condition: Secondary | ICD-10-CM | POA: Diagnosis not present

## 2023-03-01 DIAGNOSIS — F1011 Alcohol abuse, in remission: Secondary | ICD-10-CM | POA: Diagnosis not present

## 2023-03-01 DIAGNOSIS — F17200 Nicotine dependence, unspecified, uncomplicated: Secondary | ICD-10-CM | POA: Diagnosis not present

## 2023-03-01 DIAGNOSIS — F902 Attention-deficit hyperactivity disorder, combined type: Secondary | ICD-10-CM | POA: Diagnosis not present

## 2023-03-22 DIAGNOSIS — F1011 Alcohol abuse, in remission: Secondary | ICD-10-CM | POA: Diagnosis not present

## 2023-03-22 DIAGNOSIS — F17200 Nicotine dependence, unspecified, uncomplicated: Secondary | ICD-10-CM | POA: Diagnosis not present

## 2023-03-22 DIAGNOSIS — G4701 Insomnia due to medical condition: Secondary | ICD-10-CM | POA: Diagnosis not present

## 2023-03-22 DIAGNOSIS — F1210 Cannabis abuse, uncomplicated: Secondary | ICD-10-CM | POA: Diagnosis not present

## 2023-03-22 DIAGNOSIS — F3181 Bipolar II disorder: Secondary | ICD-10-CM | POA: Diagnosis not present

## 2023-03-22 DIAGNOSIS — F902 Attention-deficit hyperactivity disorder, combined type: Secondary | ICD-10-CM | POA: Diagnosis not present

## 2023-04-18 DIAGNOSIS — R197 Diarrhea, unspecified: Secondary | ICD-10-CM | POA: Diagnosis not present

## 2023-04-18 DIAGNOSIS — R42 Dizziness and giddiness: Secondary | ICD-10-CM | POA: Diagnosis not present

## 2023-04-18 DIAGNOSIS — R55 Syncope and collapse: Secondary | ICD-10-CM | POA: Diagnosis not present

## 2023-04-18 DIAGNOSIS — F319 Bipolar disorder, unspecified: Secondary | ICD-10-CM | POA: Diagnosis not present

## 2023-04-18 DIAGNOSIS — R198 Other specified symptoms and signs involving the digestive system and abdomen: Secondary | ICD-10-CM | POA: Diagnosis not present

## 2023-04-18 DIAGNOSIS — R112 Nausea with vomiting, unspecified: Secondary | ICD-10-CM | POA: Diagnosis not present

## 2023-04-18 DIAGNOSIS — R200 Anesthesia of skin: Secondary | ICD-10-CM | POA: Diagnosis not present

## 2023-04-18 DIAGNOSIS — R000 Tachycardia, unspecified: Secondary | ICD-10-CM | POA: Diagnosis not present

## 2023-04-18 DIAGNOSIS — F17210 Nicotine dependence, cigarettes, uncomplicated: Secondary | ICD-10-CM | POA: Diagnosis not present

## 2023-04-18 DIAGNOSIS — F419 Anxiety disorder, unspecified: Secondary | ICD-10-CM | POA: Diagnosis not present

## 2023-05-24 ENCOUNTER — Telehealth (HOSPITAL_BASED_OUTPATIENT_CLINIC_OR_DEPARTMENT_OTHER): Payer: Self-pay | Admitting: *Deleted

## 2023-05-24 ENCOUNTER — Ambulatory Visit (HOSPITAL_BASED_OUTPATIENT_CLINIC_OR_DEPARTMENT_OTHER): Payer: No Typology Code available for payment source | Admitting: Obstetrics & Gynecology

## 2023-05-24 NOTE — Telephone Encounter (Signed)
Patient stated that they went to their primary care instead coming her by mistake .Patient rescheduled.

## 2023-05-28 DIAGNOSIS — F3181 Bipolar II disorder: Secondary | ICD-10-CM | POA: Diagnosis not present

## 2023-05-28 DIAGNOSIS — F17200 Nicotine dependence, unspecified, uncomplicated: Secondary | ICD-10-CM | POA: Diagnosis not present

## 2023-05-28 DIAGNOSIS — G4701 Insomnia due to medical condition: Secondary | ICD-10-CM | POA: Diagnosis not present

## 2023-05-28 DIAGNOSIS — F1210 Cannabis abuse, uncomplicated: Secondary | ICD-10-CM | POA: Diagnosis not present

## 2023-05-28 DIAGNOSIS — F902 Attention-deficit hyperactivity disorder, combined type: Secondary | ICD-10-CM | POA: Diagnosis not present

## 2023-05-28 DIAGNOSIS — F1011 Alcohol abuse, in remission: Secondary | ICD-10-CM | POA: Diagnosis not present

## 2023-06-13 ENCOUNTER — Ambulatory Visit (INDEPENDENT_AMBULATORY_CARE_PROVIDER_SITE_OTHER): Payer: 59 | Admitting: Student

## 2023-06-13 VITALS — BP 116/78 | HR 102 | Ht 61.0 in | Wt 165.2 lb

## 2023-06-13 DIAGNOSIS — R55 Syncope and collapse: Secondary | ICD-10-CM

## 2023-06-13 NOTE — Progress Notes (Signed)
SUBJECTIVE:   CHIEF COMPLAINT / HPI:   Collapse Patient presents for recurrent collapse, most recently yesterday.  Has been seen at [facility] ED in September for syncopal episode.  Workup in ED was unremarkable.  Her most recent episode did not include loss of consciousness.  She reports that when she stood up she felt dizzy, her tongue went numb and then she fell.  She did not hit her head, she is not on blood thinners.  This has happened multiple times over the summer.  She has abstained from marijuana for 1 month, and is no longer taking Concerta.  Denies other substance use at this time.  She additionally reports mother dizzy episodes that did not resolve and collapse.  Denies history of vertigo.  She states that high stress situations quick movements usually precipitate dizziness.  She has not experienced seizure-like activity, nor postictal state.  No incontinence symptoms.  She has been seen for this problem in the past, and had Zio patch recently was negative for concerning pathology.  She has not had an echocardiogram, and she did not follow-up with cardiology due to insurance issues at that time.  Other concerns to be addressed at next appointment: Heel pain, left leg pain, intermittent bilateral leg swelling, STD testing, intermittent sore throat  PERTINENT  PMH / PSH: IBS, PTSD, mood disorder, major depressive disorder, anxiety, substance use disorder  OBJECTIVE:   BP 116/78   Pulse (!) 102   Ht 5\' 1"  (1.549 m)   Wt 165 lb 3.2 oz (74.9 kg)   SpO2 100%   BMI 31.21 kg/m    General: NAD, pleasant Cardio: RRR, no MRG. Cap Refill <2s. No over swelling of extremities.  Respiratory: CTAB, normal wob on RA GI: Abdomen is soft, not tender, not distended. BS present Skin: Warm and dry  ASSESSMENT/PLAN:   Assessment & Plan Collapse Recurrent collapse (sometimes with LOC), benign workup thus far including Zio Patch. Negative orthostatic vitals today.  Dix-Hallpike,  mildly positive for dizziness with head rotated to the left. Recommend obtaining echocardiogram.  Differential includes: Vasovagal syncope, BPPV, orthostatic syncope, cardiac disease, substance induced, POTS, psychogenic.  Very low suspicion for seizure disorder, given lack of seizure activity nor postictal state. - Follow-up echocardiogram - Referral to cardiology - Consider referral to neurology - Patient will keep track of dizzy episodes and triggers, consider vestibular rehab - ED precautions reviewed   Follow-up recommendations Follow-up on dizziness episodes and collapse.  If suspicious for BPPV, sent to vestibular rehab. Patient requesting STD testing, not currently symptomatic but has new partner. Patient reports intermittent pharyngitis.  Consider throat swabs if not improved by next visit. With regard to polyarthralgia and swelling, she has had multiple visits for this.  ANA and RF 6 months ago including CRP and sed rate.  Does not completely rule out rheumatologic cause, however it is lower suspicion for this as time.  Could consider vitamin deficiency screening given substance use history. Complaining of " heel spur" would recommend point-of-care ultrasound if possible. If not could send to sports medicine. Please note the patient is currently off of Concerta, and has been abstaining from marijuana for 1 month.  Ativan is as needed.  She has very symptomatic anxiety and depression, would ensure adequate psychiatric follow-up. Patient has been prescheduled for multiple visits, given number of concerns will take multiple visits.  I recommend scheduling the patient for follow-up in the room if time allows.  Tiffany Kocher, DO [facility]

## 2023-06-13 NOTE — Patient Instructions (Signed)
It was great to see you! Thank you for allowing me to participate in your care!   I recommend that you always bring your medications to each appointment as this makes it easy to ensure we are on the correct medications and helps [place] not miss when refills are needed.  Our plans for today:  - We have placed an order for echocardiogram. You will receive a call to schedule. - We have placed a cardiology referral. You will receive a call in the next 2-3 weeks to schedule. - Please follow-up next week with Dr. Georg Ruddle   Take care and seek immediate care sooner if you develop any concerns. Please remember to show up 15 minutes before your scheduled appointment time!  Tiffany Kocher, DO [facility]

## 2023-06-19 ENCOUNTER — Encounter: Payer: Self-pay | Admitting: Family Medicine

## 2023-06-19 ENCOUNTER — Ambulatory Visit (INDEPENDENT_AMBULATORY_CARE_PROVIDER_SITE_OTHER): Payer: 59 | Admitting: Family Medicine

## 2023-06-19 ENCOUNTER — Other Ambulatory Visit (HOSPITAL_COMMUNITY)
Admission: RE | Admit: 2023-06-19 | Discharge: 2023-06-19 | Disposition: A | Discharge status: Home / Self Care | Payer: 59 | Source: Ambulatory Visit | Attending: Family Medicine | Admitting: Family Medicine

## 2023-06-19 VITALS — BP 115/70 | HR 110 | Temp 98.3°F | Wt 164.8 lb

## 2023-06-19 DIAGNOSIS — Z202 Contact with and (suspected) exposure to infections with a predominantly sexual mode of transmission: Secondary | ICD-10-CM | POA: Insufficient documentation

## 2023-06-19 DIAGNOSIS — Z720 Tobacco use: Secondary | ICD-10-CM

## 2023-06-19 DIAGNOSIS — Z711 Person with feared health complaint in whom no diagnosis is made: Secondary | ICD-10-CM

## 2023-06-19 DIAGNOSIS — R058 Other specified cough: Secondary | ICD-10-CM | POA: Insufficient documentation

## 2023-06-19 LAB — POCT WET PREP (WET MOUNT)
Clue Cells Wet Prep Whiff POC: NEGATIVE
Trichomonas Wet Prep HPF POC: ABSENT
WBC, Wet Prep HPF POC: NONE SEEN

## 2023-06-19 NOTE — Progress Notes (Signed)
    SUBJECTIVE:   CHIEF COMPLAINT / HPI:  Presents for STD testing. They have a new partner and are wanting to be tested to be safe but are not experiencing any symptoms at this time.  Also has a cough that has been going on for a while with yellow production for a few weeks. Has also had a sore throat. Smokes cigarettes (1 PPD) for 10+ years. Has had fatigue and cold symptoms. Has not had any shortness of breath. Discussed harmful effects that smoking causes to lungs and airway and how smoking is likely making these symptoms worse. They expressed understanding.   PERTINENT  PMH / PSH:  Tobacco Use disorder   OBJECTIVE:   BP 115/70   Pulse (!) 110   Temp 98.3 F (36.8 C)   Wt 164 lb 12.8 oz (74.8 kg)   SpO2 98%   BMI 31.14 kg/m    General: A&O, NAD HEENT: No sign of trauma, normocephalic  Cardiac: RRR, no m/r/g Respiratory: CTAB, normal WOB, no w/c/r GI: Soft, NTTP, non-distended  Extremities: NTTP, no peripheral edema. Neuro: Normal gait, moves all four extremities appropriately. Psych: Appropriate mood and affect GU:  Female genitalia: Vulva: normal appearing vulva with no masses, tenderness or lesions Vagina: normal appearing vagina with normal color and discharge, no lesions Cervix: normal appearing cervix without discharge or lesions   ASSESSMENT/PLAN:   Tobacco use Patient has >10 pack-year history. Counseled on tobacco use and harmful side effects. Patient expressed understanding. Offered resources but patient is not interesting in quitting right now but have set a goal for themselves to quit by 33.  - encourage counseling patient and offering resources at follow up   Possible exposure to STD Patient has no symptoms of STD but would like to be testing as they have a new partner. - GC & Trich  - Wet Prep - HIV, RPR, Hep B and Hep C    Hal Morales, MD [facility]

## 2023-06-19 NOTE — Patient Instructions (Signed)
It was wonderful to see you today.  Please bring ALL of your medications with you to every visit.   Today we talked about:  STI screening - these results will be uploaded to your myChart and I will also communicate the findings with you.  Your cough and sputum production is likely secondary to viral bronchitis. This is made worse with smoking. If your cough and sputum production does not get better in a few weeks time or you are experiencing shortness of breath, please return to clinic of if worrisome symptoms, go to the Emergency Department.   Thank you for choosing [facility].   Please call [phone] with any questions about today's appointment.  Please arrive at least 15 minutes prior to your scheduled appointments.   If you had blood work today, I will send you a MyChart message or a letter if results are normal. Otherwise, I will give you a call.   If you had a referral placed, they will call you to set up an appointment. Please give [place] a call if you don't hear back in the next 2 weeks.   If you need additional refills before your next appointment, please call your pharmacy first.   Hal Morales, MD Family Medicine

## 2023-06-19 NOTE — Assessment & Plan Note (Signed)
Patient has no symptoms of STD but would like to be testing as they have a new partner. - GC & Trich  - Wet Prep - HIV, RPR, Hep B and Hep C

## 2023-06-19 NOTE — Assessment & Plan Note (Signed)
Patient has >10 pack-year history. Counseled on tobacco use and harmful side effects. Patient expressed understanding. Offered resources but patient is not interesting in quitting right now but have set a goal for themselves to quit by 33.  - encourage counseling patient and offering resources at follow up

## 2023-06-19 NOTE — Assessment & Plan Note (Signed)
Patient has had cough with productive yellow sputum for a few weeks. No shortness of breath. Lungs clear to auscultation. NWOB, no wheezing noted. Likely viral bronchitis. Counseled re tobacco use. - return precautions given

## 2023-06-20 ENCOUNTER — Encounter: Payer: Self-pay | Admitting: Family Medicine

## 2023-06-20 LAB — CERVICOVAGINAL ANCILLARY ONLY
Chlamydia: NEGATIVE
Comment: NEGATIVE
Comment: NEGATIVE
Comment: NORMAL
Neisseria Gonorrhea: NEGATIVE
Trichomonas: NEGATIVE

## 2023-06-20 LAB — HCV AB W REFLEX TO QUANT PCR: HCV Ab: NONREACTIVE

## 2023-06-20 LAB — HIV ANTIBODY (ROUTINE TESTING W REFLEX): HIV Screen 4th Generation wRfx: NONREACTIVE

## 2023-06-20 LAB — HEPATITIS B SURFACE ANTIGEN: Hepatitis B Surface Ag: NEGATIVE

## 2023-06-20 LAB — HCV INTERPRETATION

## 2023-06-20 LAB — RPR: RPR Ser Ql: NONREACTIVE

## 2023-06-25 ENCOUNTER — Encounter: Payer: Self-pay | Admitting: Family Medicine

## 2023-06-26 ENCOUNTER — Encounter: Payer: Self-pay | Admitting: Family Medicine

## 2023-06-26 ENCOUNTER — Ambulatory Visit (INDEPENDENT_AMBULATORY_CARE_PROVIDER_SITE_OTHER): Payer: 59 | Admitting: Family Medicine

## 2023-06-26 VITALS — BP 98/60 | HR 95 | Wt 169.0 lb

## 2023-06-26 DIAGNOSIS — Z720 Tobacco use: Secondary | ICD-10-CM

## 2023-06-26 DIAGNOSIS — J208 Acute bronchitis due to other specified organisms: Secondary | ICD-10-CM

## 2023-06-26 DIAGNOSIS — M722 Plantar fascial fibromatosis: Secondary | ICD-10-CM | POA: Diagnosis not present

## 2023-06-26 MED ORDER — NICOTINE 21 MG/24HR TD PT24: 21.0000 mg | MEDICATED_PATCH | Freq: Every day | TRANSDERMAL | 0 refills | Status: DC

## 2023-06-26 MED ORDER — BENZONATATE 100 MG PO CAPS: 100.0000 mg | ORAL_CAPSULE | Freq: Two times a day (BID) | ORAL | 0 refills | Status: DC | PRN

## 2023-06-26 NOTE — Assessment & Plan Note (Addendum)
Cough w/o SOB or fever. Clear/white sputum production. Symptoms consistent with viral bronchitis likely exacerbated by smoking. Gave return precautions. - tessalon perles prescribed

## 2023-06-26 NOTE — Progress Notes (Signed)
    SUBJECTIVE:   CHIEF COMPLAINT / HPI:  Patient has ongoing cough without congestion or shortness of breath. They are still smoking 1 PPD and also smoking marijuana intermittently. They feel like the cough is getting better slowly but still having some coughing fits that will cause them to gasp to catch breath. They  stated a fever but it was only to 100.0 so not an objective finding. This motivated patient to quit smoking so they would like to try nicotine patches.   Also having foot pain in R heel and plantar surface. Pain is worse at end of day. Pain is dull and achey. They have to stand for long periods of time and do not feel they have supportive shoes.   PERTINENT  PMH / PSH:  - Tobacco use disorder - Anxiety - MDD   OBJECTIVE:   BP 98/60   Pulse 95   Wt 169 lb (76.7 kg)   SpO2 98%   BMI 31.93 kg/m   General: A&O, NAD HEENT: No sign of trauma, EOM grossly intact. Oropharynx without erythema or exudate.  Cardiac: RRR, no m/r/g Respiratory: CTAB, normal WOB, no w/c/r GI: Soft, NTTP, non-distended  Extremities: NTTP, no peripheral edema. MSK: TTP along R heel and plantar surface. Normal ROM and strength.  Neuro: Normal gait, moves all four extremities appropriately. Psych: Appropriate mood and affect  ASSESSMENT/PLAN:   Tobacco use - Nicotine patches prescribed today - discussed appointment with Dr. Raymondo Band for tobacco cessation, patient is amenable to this. Will schedule for early next year.  Viral bronchitis Cough w/o SOB or fever. Clear/white sputum production. Symptoms consistent with viral bronchitis likely exacerbated by smoking. Gave return precautions. - tessalon perles prescribed   Plantar fasciitis Pain described by patient consistent with plantar fasciitis. Likely excerbated by patients job and having to stand for several hours a day. Encouraged supportive foot wear. - exercises given - NSAIDs for pain - supportive footwear  Intermittent  swelling Patient has history of extensive work up for swelling which was all wnl. Encouraged patient to return for follow up visit to discuss this in depth. Patient agreeable to follow up to discuss this and other health concerns at follow up visit. Will plan to see patient in Jan. 2025 Hal Morales, MD [facility]

## 2023-06-26 NOTE — Assessment & Plan Note (Signed)
Pain described by patient consistent with plantar fasciitis. Likely excerbated by patients job and having to stand for several hours a day. Encouraged supportive foot wear. - exercises given - NSAIDs for pain - supportive footwear

## 2023-06-26 NOTE — Assessment & Plan Note (Signed)
-   Nicotine patches prescribed today - discussed appointment with Dr. Raymondo Band for tobacco cessation, patient is amenable to this. Will schedule for early next year.

## 2023-06-26 NOTE — Patient Instructions (Signed)
It was wonderful to see you today.  Please bring ALL of your medications with you to every visit.   Today we talked about:  - heel pain: this sounds like plantar fascitis. I have included some exercises for you to try. Main things are to make sure you have a strong sole for your shoe.  - cough: this sounds like a viral cough to me. I have sent some medication that helps with cough suppression to your pharmacy. If you have fevers (greater than 100.4) that are not brought down by medicine, shortness of breath, chest pain, etc- these are reasons to go to the hospital  - I have sent nicotine patches to your pharmacy. Please schedule a visit with Dr. Raymondo Band in the front  - lets plan to see you early next year to discuss your other health concerns and for follow up  Thank you for choosing [facility].   Please call [phone] with any questions about today's appointment.  Please arrive at least 15 minutes prior to your scheduled appointments.   If you had blood work today, I will send you a MyChart message or a letter if results are normal. Otherwise, I will give you a call.   If you had a referral placed, they will call you to set up an appointment. Please give [place] a call if you don't hear back in the next 2 weeks.   If you need additional refills before your next appointment, please call your pharmacy first.   Hal Morales, MD Family Medicine

## 2023-06-28 ENCOUNTER — Other Ambulatory Visit: Payer: Self-pay | Admitting: Family Medicine

## 2023-06-28 DIAGNOSIS — R55 Syncope and collapse: Secondary | ICD-10-CM

## 2023-07-17 ENCOUNTER — Ambulatory Visit (HOSPITAL_BASED_OUTPATIENT_CLINIC_OR_DEPARTMENT_OTHER)
Admission: RE | Admit: 2023-07-17 | Discharge: 2023-07-17 | Disposition: A | Discharge status: Home / Self Care | Payer: 59 | Source: Ambulatory Visit | Attending: Family Medicine | Admitting: Family Medicine

## 2023-07-17 DIAGNOSIS — R55 Syncope and collapse: Secondary | ICD-10-CM | POA: Insufficient documentation

## 2023-07-17 LAB — ECHOCARDIOGRAM COMPLETE
AR max vel: 2.11 cm2
AV Area VTI: 1.86 cm2
AV Area mean vel: 1.95 cm2
AV Mean grad: 4 mm[Hg]
AV Peak grad: 6.6 mm[Hg]
Ao pk vel: 1.28 m/s
Area-P 1/2: 5.31 cm2
Calc EF: 60 %
S' Lateral: 2.6 cm
Single Plane A2C EF: 56.6 %
Single Plane A4C EF: 63.8 %

## 2023-09-17 ENCOUNTER — Other Ambulatory Visit (HOSPITAL_COMMUNITY)
Admission: RE | Admit: 2023-09-17 | Discharge: 2023-09-17 | Disposition: A | Discharge status: Home / Self Care | Payer: 59 | Source: Ambulatory Visit | Attending: Obstetrics & Gynecology | Admitting: Obstetrics & Gynecology

## 2023-09-17 ENCOUNTER — Ambulatory Visit (INDEPENDENT_AMBULATORY_CARE_PROVIDER_SITE_OTHER): Payer: 59 | Admitting: Obstetrics & Gynecology

## 2023-09-17 ENCOUNTER — Encounter (HOSPITAL_BASED_OUTPATIENT_CLINIC_OR_DEPARTMENT_OTHER): Payer: Self-pay | Admitting: Obstetrics & Gynecology

## 2023-09-17 VITALS — BP 113/76 | HR 87 | Ht 62.0 in | Wt 171.8 lb

## 2023-09-17 DIAGNOSIS — Z01419 Encounter for gynecological examination (general) (routine) without abnormal findings: Secondary | ICD-10-CM

## 2023-09-17 DIAGNOSIS — Z803 Family history of malignant neoplasm of breast: Secondary | ICD-10-CM

## 2023-09-17 DIAGNOSIS — N870 Mild cervical dysplasia: Secondary | ICD-10-CM | POA: Diagnosis not present

## 2023-09-17 DIAGNOSIS — Z113 Encounter for screening for infections with a predominantly sexual mode of transmission: Secondary | ICD-10-CM

## 2023-09-17 DIAGNOSIS — Z124 Encounter for screening for malignant neoplasm of cervix: Secondary | ICD-10-CM | POA: Insufficient documentation

## 2023-09-17 DIAGNOSIS — B977 Papillomavirus as the cause of diseases classified elsewhere: Secondary | ICD-10-CM
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
## 2023-09-20 LAB — CYTOLOGY - PAP
Chlamydia: NEGATIVE
Comment: NEGATIVE
Comment: NEGATIVE
Comment: NORMAL
Diagnosis: NEGATIVE
High risk HPV: NEGATIVE
Neisseria Gonorrhea: NEGATIVE

## 2023-09-21 ENCOUNTER — Encounter (HOSPITAL_BASED_OUTPATIENT_CLINIC_OR_DEPARTMENT_OTHER): Payer: Self-pay | Admitting: Obstetrics & Gynecology

## 2023-10-27 ENCOUNTER — Other Ambulatory Visit: Payer: Self-pay | Admitting: Family Medicine

## 2023-10-27 DIAGNOSIS — Z720 Tobacco use: Secondary | ICD-10-CM

## 2023-11-02 ENCOUNTER — Ambulatory Visit (INDEPENDENT_AMBULATORY_CARE_PROVIDER_SITE_OTHER): Admitting: Family Medicine

## 2023-11-02 VITALS — BP 117/83 | HR 88 | Ht 62.0 in | Wt 179.5 lb

## 2023-11-02 DIAGNOSIS — M722 Plantar fascial fibromatosis: Secondary | ICD-10-CM | POA: Diagnosis not present

## 2023-11-02 DIAGNOSIS — M549 Dorsalgia, unspecified: Secondary | ICD-10-CM

## 2023-11-02 NOTE — Progress Notes (Signed)
    SUBJECTIVE:   CHIEF COMPLAINT / HPI:   Beth [name] is a 33 year old patient with history of plantar fasciitis that presents for exacerbation of plantar fasciitis and acute right shoulder pain.  Plantar fasciitis -Reports ongoing pain from plantar fasciitis on left foot -Has bought insoles, but does not feel like these are helping enough - Sometimes tylenol and ibuprofen, but not consistently -Not currently incorporating any stretching exercises  Right shoulder/back pain - Also reports R shoulder pain with sharp pain, started a few weeks ago. - Works as a Administrator, [employer] and reports some injuries from animals but unsure of any particular instance of trauma.   OBJECTIVE:   BP 117/83   Pulse 88   Ht 5\' 2"  (1.575 m)   Wt 179 lb 8 oz (81.4 kg)   SpO2 100%   BMI 32.83 kg/m   General: Alert, pleasant well-appearing adult . NAD. HEENT: NCAT. MMM. CV: RRR, no murmurs.  Resp: CTAB, no wheezing or crackles. Normal WOB on RA.  Skin: Warm, well perfused  MSK: Diffuse tenderness to R-side thoracic back, traveling up the R side of neck, in trapezius muscle distribution. Normal ROM of BL UE.  Regular gait  ASSESSMENT/PLAN:   Assessment & Plan Plantar fasciitis Acute on chronic exacerbation of left heel plantar fasciitis pain.  Already using insoles. -Provided handout with plantar fasciitis stretching exercises.  Advised to do 5 minutes of stretching exercises twice a day. -Advised to use ibuprofen 400 mg every 6 hours for next 2 weeks, then switch to using as needed -Tylenol as needed Upper back pain on right side Pain is in distribution of right trapezius muscle, which is most consistent with strain of right trapezius muscle. -Ibuprofen and Tylenol as above     Lincoln Brigham, MD [facility]

## 2023-11-02 NOTE — Patient Instructions (Addendum)
 Ibuprofen scheduled good to see you today - Thank you for coming in  Things we discussed today:  1) For the plantar fasciitis of your left foot, this is caused by excessive strain on your plantar fascia on the bottom of your foot.  This condition takes time and rest to recover, but can last for weeks to months and can return as well. -Take ibuprofen 400 mg every 6 hours for the next 2 weeks.  Afterwards, you can use it just as needed. -Use Tylenol as needed as well.  This is safe to use with ibuprofen. -Do stretching exercises for 5 minutes twice a day for the next month.  If you feel like your plantar fascia is getting aggravated again, you should restart the exercises.  2) For your R shoulder and back pain, is most likely due to muscle strain of the muscles that support your back.  Try to avoid excessive lifting if possible. -Taking the ibuprofen and Tylenol as above will also help for this condition.

## 2023-11-02 NOTE — Assessment & Plan Note (Signed)
 Acute on chronic exacerbation of left heel plantar fasciitis pain.  Already using insoles. -Provided handout with plantar fasciitis stretching exercises.  Advised to do 5 minutes of stretching exercises twice a day. -Advised to use ibuprofen 400 mg every 6 hours for next 2 weeks, then switch to using as needed -Tylenol as needed

## 2023-11-27 ENCOUNTER — Other Ambulatory Visit: Payer: Self-pay

## 2023-11-27 DIAGNOSIS — Z720 Tobacco use: Secondary | ICD-10-CM

## 2023-11-27 MED ORDER — NICOTINE 21 MG/24HR TD PT24: 21.0000 mg | MEDICATED_PATCH | Freq: Every day | TRANSDERMAL | 0 refills | Status: DC

## 2023-11-29 NOTE — Telephone Encounter (Signed)
 Patient calls nurse line in regards to Nicotine Patches.   She reports she is on her last one and the pharmacy reports a prescription was never sent over.   I called the pharmacy and gave verbal for patches sent in by PCP on 4/15.  Patient appreciative.

## 2024-03-07 ENCOUNTER — Encounter: Payer: Self-pay | Admitting: Family Medicine

## 2024-03-07 ENCOUNTER — Ambulatory Visit (INDEPENDENT_AMBULATORY_CARE_PROVIDER_SITE_OTHER): Admitting: Family Medicine

## 2024-03-07 VITALS — BP 126/83 | HR 97 | Temp 98.3°F | Ht 62.0 in | Wt 167.6 lb

## 2024-03-07 DIAGNOSIS — F4310 Post-traumatic stress disorder, unspecified: Secondary | ICD-10-CM

## 2024-03-07 DIAGNOSIS — F333 Major depressive disorder, recurrent, severe with psychotic symptoms: Secondary | ICD-10-CM | POA: Diagnosis not present

## 2024-03-07 DIAGNOSIS — F5101 Primary insomnia: Secondary | ICD-10-CM

## 2024-03-07 DIAGNOSIS — Z720 Tobacco use: Secondary | ICD-10-CM

## 2024-03-07 DIAGNOSIS — F419 Anxiety disorder, unspecified: Secondary | ICD-10-CM | POA: Diagnosis not present

## 2024-03-07 MED ORDER — TRAZODONE HCL 50 MG PO TABS: 50.0000 mg | ORAL_TABLET | Freq: Every day | ORAL | 1 refills | Status: DC

## 2024-03-07 MED ORDER — NICOTINE POLACRILEX 2 MG MT LOZG: 2.0000 mg | LOZENGE | OROMUCOSAL | 0 refills | Status: DC | PRN

## 2024-03-07 MED ORDER — VARENICLINE TARTRATE (STARTER) 0.5 MG X 11 & 1 MG X 42 PO TBPK: ORAL_TABLET | ORAL | 0 refills | Status: AC

## 2024-03-07 MED ORDER — LORAZEPAM 1 MG PO TABS: 1.0000 mg | ORAL_TABLET | ORAL | 0 refills | Status: AC | PRN

## 2024-03-07 NOTE — Patient Instructions (Addendum)
 Thank you for visiting clinic today and allowing us  to participate in your care!  We have placed a referral for you to connect with a new psychiatrist. You can also call your insurance company to see which providers are covered under your plan.   You are scheduled to see our clinical pharmacist, Dr Koval, to further discuss quitting smoking. In the meantime, please start taking Chantix as prescribed and using nicotine  lozenges as needed,   Future Appointments  Date Time Provider Department Center  03/21/2024  9:30 AM Koval, Peter G, RPH-CPP [facility]   Please schedule an appointment with your PCP as needed.   Reach out any time with any questions or concerns you may have - we are here for you!  Damien Cassis, MD [facility] [phone]

## 2024-03-07 NOTE — Progress Notes (Signed)
    SUBJECTIVE:   CHIEF COMPLAINT / HPI:   MDD/Anxiety/PTSD -Presenting today for mood medication management  -Had been following with psychiatry until insurance changes recently, became cost prohibitive  -Uses ativan  4-5 times per week - needs refill  -Trazodone  for sleep - needs refill  -overall feels super irritable, emotional, might have BPD -Meets with weekly therapist  -No current SI/HI  Smoking cessation  -interested in quitting smoking  -currently smokes 1 pack per day  -not using patch anymore   PERTINENT  PMH / PSH: MDD/Anxiety, PTSD, tobacco use  OBJECTIVE:   BP 126/83   Pulse 97   Temp 98.3 F (36.8 C)   Ht 5' 2 (1.575 m)   Wt 167 lb 9.6 oz (76 kg)   SpO2 99%   BMI 30.65 kg/m   General: Well-appearing. Resting comfortably in room. CV: Normal S1/S2. No extra heart sounds. Warm and well-perfused. Pulm: Breathing comfortably on room air. CTAB. No increased WOB. Skin:  Warm, dry. Psych: Pleasant and appropriate.    ASSESSMENT/PLAN:   Assessment & Plan MDD (major depressive disorder), recurrent, severe, with psychosis (HCC) Post traumatic stress disorder (PTSD) Anxiety Primary insomnia Previously followed with psychiatry, in need of new provider due to cost. No SI/HI.  -Psych referral placed  -Encouraged patient to call insurance company regarding cost of providers -Temporary rx refill for Ativan  1 prn  -Refilled trazodone  50  -Cont weekly therapy  Tobacco use Patient motivated to quit smoking.  - Discussed starting Chantix  - Nicotine  lozenges  - Scheduled patient to see Dr Amalia on 8/8 for further management   RTC as needed.   Damien Cassis, MD [facility]

## 2024-03-09 NOTE — Assessment & Plan Note (Signed)
 Previously followed with psychiatry, in need of new provider due to cost. No SI/HI.  -Psych referral placed  -Encouraged patient to call insurance company regarding cost of providers -Temporary rx refill for Ativan  1 prn  -Refilled trazodone  50  -Cont weekly therapy

## 2024-03-09 NOTE — Assessment & Plan Note (Signed)
 Patient motivated to quit smoking.  - Discussed starting Chantix  - Nicotine  lozenges  - Scheduled patient to see Dr Koval on 8/8 for further management

## 2024-03-13 ENCOUNTER — Telehealth: Payer: Self-pay

## 2024-03-13 NOTE — Telephone Encounter (Signed)
 Patient calls nurse line to give update.   She reports she did go to UC and was advised to stop taking Chantix .   She reports she was told to contact our office for replacement therapy. She has an apt for smoking cessation with Koval on 8/8. However, reports she would like to start something as soon as possible.   She reports she has used the patch before, however did not have great success.   Advised will forward to provider who saw patient.

## 2024-03-13 NOTE — Telephone Encounter (Signed)
 Patient calls nurse line reporting possible adverse reaction to Chantix .   She reports she started the medication on Monday and reports she noticed a change in her depressive symptoms. She reports being more down and depressed than usual, along with sleeping longer than usual. She reports she slept 13 hours last night.   She reports she increased her dose today per instruction. However, reports she has felt disoriented, confused and very fatigued today.   She denies any hives, itching, trouble breathing or swallowing since starting Chantix .   Patient advised evaluation today. She reports she lives in [place] and will go to an UC or ED near her home.   Patient advised to FU with our office.

## 2024-03-21 ENCOUNTER — Telehealth: Admitting: Pharmacist

## 2024-03-21 ENCOUNTER — Encounter: Payer: Self-pay | Admitting: Pharmacist

## 2024-03-21 DIAGNOSIS — Z720 Tobacco use: Secondary | ICD-10-CM

## 2024-03-21 MED ORDER — NICOTINE POLACRILEX 2 MG MT LOZG: 2.0000 mg | LOZENGE | OROMUCOSAL | 3 refills | Status: AC | PRN

## 2024-03-21 MED ORDER — NICOTINE 21 MG/24HR TD PT24: 21.0000 mg | MEDICATED_PATCH | Freq: Every day | TRANSDERMAL | 3 refills | Status: AC

## 2024-03-21 NOTE — Assessment & Plan Note (Signed)
 Longstanding tobacco use disorder with severe nicotine  dependence of 1-1.5 ppd. Longest quit attempt of 7 months duration 10 years ago (2015). Most recent quit attempt 11/2023 for one month with use of patch, but no longer using and currently smoking 1 ppd. Patient reports previous success with patch, but still wanting to smoke on it. Patient is a good candidate for combination (long-acting and short-acting) NRT d/t high-dependence. Patient is good candidate for success because of high motivation to quit, despite lower confidence (5/10) that she can quit by goal quit date 05/30/2024.    -Initiated nicotine  replacement tx with 1 patch (21 mg) daily and lozenge 2 mg PRN. Patient counseled on purpose, proper use, and potential adverse effects, including nausea and vivid dreams.   -Patient counseled on benefits of tobacco cessation and engaged in motivational interviewing to empower change. -Patient advised that any progress is progress. Goal to decrease cigarette use to less than 1 ppd. -Provided information on 1 800-QUIT NOW support program.

## 2024-03-21 NOTE — Progress Notes (Signed)
 Reviewed and agree with Dr Macky Lower plan.

## 2024-03-21 NOTE — Progress Notes (Signed)
   S:   Chief Complaint  Patient presents with   Medication Management    Tobacco cessation   33 y.o. adult who presents via video visit for evaluation/assistance with tobacco dependence. Patient is in good spirits despite flat tire and appreciative of changing office visit to virtual.    Patient was referred and last seen by Primary Care Provider, Dr. Diona, on 03/07/24.  At last visit, patient reported interest in tobacco cessation.   PMH is significant for tobacco use disorder, alcohol  use disorder (last used 4 years ago - no longer attending AA), MDD/anxiety, and PTSD.   Brand smokes cheapest brand possible. Number of cigarettes: 20 (1 ppd). Previously smoking 1.5 ppd last year. Reports first cigarette immediately after waking. Reports waking to smoke 0-1 time per week.   Most recent quit attempt: 11/2023 for one month Longest time ever been tobacco free: 7 months (~10 years ago, 2015).   Medications used in past cessation efforts include:  Chantix  (varenicline )  - first three days reports some fatigue/depressed mood, but attributed feeling to comorbid depression, on day 4 (dose increase) reports sleeping for 15-16 hours and depressive episode NRT patch 21 mg - reports it working well, never stepped down to phase 2. Stopped using d/t continuing to smoke 1 ppd. Rubber cigarettes without success  Rates IMPORTANCE of quitting tobacco on 1-10 scale of 10. Rates CONFIDENCE of quitting tobacco on 1-10 scale of 5.  Most common triggers to use tobacco include; stress, relationships/acquaintances, mental health    Motivation to quit: Patient reports high desire to quit by 05/30/2024 (33th birthday). Reports low confidence d/t previous attempts to quit and knowing it will be hard.    A/P: Longstanding tobacco use disorder with severe nicotine  dependence of 1-1.5 ppd. Longest quit attempt of 7 months duration 10 years ago (2015). Most recent quit attempt 11/2023 for one month with use  of patch, but no longer using and currently smoking 1 ppd. Patient reports previous success with patch, but still wanting to smoke on it. Patient is a good candidate for combination (long-acting and short-acting) NRT d/t high-dependence. Patient is good candidate for success because of high motivation to quit, despite lower confidence (5/10) that she can quit by goal quit date 05/30/2024.    -Initiated nicotine  replacement tx with 1 patch (21 mg) daily and lozenge 2 mg PRN. Patient counseled on purpose, proper use, and potential adverse effects, including nausea and vivid dreams.   -Patient counseled on benefits of tobacco cessation and engaged in motivational interviewing to empower change. -Patient advised that any progress is progress. Goal to decrease cigarette use to less than 1 ppd. -Provided information on 1 800-QUIT NOW support program.   Written patient instructions provided. Patient verbalized understanding of treatment plan.  Total time in face to face counseling 28 minutes.    Follow-up:  Pharmacist 04/03/24 1:00 pm (virtual) PCP clinic visit TBD Patient seen with Calton Nash, PharmD Candidate - PY4 student.

## 2024-03-21 NOTE — Patient Instructions (Addendum)
 It was nice to see you today! Keep up the hard work of staying motivated to quit smoking, you are doing your future self a great service!   Medication Changes: - RE-START 1 patch (21 mg) applied to clean skin daily.  - RE-START 1 lozenge (2 mg total) by mouth as needed for smoking cessation. Do not use more than 5 lozenges in 6 hours or more than 20 lozenges in one day. - Continue all other medication the same. -Reduce to less than 20 cigarettes per day by next visit 04/03/24. Any progress is good progress!    Tobacco Patient Instructions  Quitting smoking is one of the most important decisions you can make for your current and future health. Consider what you dislike about smoking and how quitting could personally benefit you. Try to cut down.   My target quit date is: 05/30/2024!  Starting today, Be a Quitter!  Remind yourself why you want to quit.  Delay your first cigarette of the day for as long as possible.  Start cleaning out all pockets, drawers, and your car of cigarettes.  Getting Through the Cravings Once You Are Smoke Free: Each craving will last about 10 minutes, whether or not you smoke. Here's how to get through the cravings without cigarettes:  DELAY: Tell yourself that you'll wait for the next craving. Do it every time! DEEP BREATHS: One reason smoking feels good is because you breathe in deeply to inhale. Take four slow, deep breaths and feel the relaxation without the hamful effects of cigarettes. DRINK WATER: Drink a glass of cool water. It will give your hands and mouth something to do and will help flush the nicotine  out of your system faster. DIVERT: Do something else -- brush your teeth, take a walk, call a friend who can offer you support. Just moving onto something other than thinking about cigarettes will move you through the craving.  Frequently Asked Questions  What can I do when I get the urge to smoke? To get through the urge to smoke, try the  following:  Review your reasons for quitting and think of all the benefits to your health, your finances, and your family.  Remind yourself that there is no such thing as just one cigarette -- or even one puff.  Ride out the desire to smoke. Use the 4 Os -- Delay, Deep Breaths, Drink Water and Divert to get you through. The craving will go away eventually. Do not fool yourself into thinking you can have just one cigarette.  Any tips on how to deal with stress? Stress is a natural part of life. The key is to deal with it without reaching for a cigarette. Taking deep breaths, counting backwards from 10 and asking yourself 1-how big a deal is this?"  Writing down your feelings, talking with a friend and doing things like positive self-talk and meditation are some other ways that people deal with daily stress.  What if I start smoking again? Slips happen. Most people try to quit smoking a few times before they are successful. Don't beat yourself up if this happens to you! Ask yourself if this was a slip or a relapse. A slip is a one-time mistake that is quickly corrected. A relapse is going back to your old smoking habits.   If you slip, don't give up. Think of it as a learning experience. Ask yourself what went wrong and renew your commitment to staying away from smoking for good.  If you relapse,  try not to get discouraged. Ask yourself the question "What caused me to start smoking?" Figure out what helped you and what didn't when you tried to quit. Knowing why you relapsed is useful information for your next attempt to quit.

## 2024-03-30 ENCOUNTER — Other Ambulatory Visit: Payer: Self-pay | Admitting: Family Medicine

## 2024-03-30 DIAGNOSIS — F5101 Primary insomnia: Secondary | ICD-10-CM

## 2024-04-02 ENCOUNTER — Telehealth: Payer: Self-pay

## 2024-04-02 NOTE — Telephone Encounter (Signed)
 Patient calls nurse line reporting abdominal pain.   She reports symptoms started yesterday with some light spotting and period cramping. She reports she has not had a period since her IUD was placed ~ 1 year ago.   She reports this morning she woke up to intense cramping. She reports she feels like something is wrong with her IUD and she has concerns it has moved.   She reports vaginal discharge with an abnormal odor. She reports she has been feverish and reports chills. She reports nausea and vomiting as well. Patient stated I have been throwing up all morning.   Patient advised to go to [facility] for evaluation.  Patient agreed with plan.

## 2024-04-03 ENCOUNTER — Encounter: Payer: Self-pay | Admitting: Pharmacist

## 2024-04-03 ENCOUNTER — Telehealth (INDEPENDENT_AMBULATORY_CARE_PROVIDER_SITE_OTHER): Admitting: Pharmacist

## 2024-04-03 DIAGNOSIS — Z720 Tobacco use: Secondary | ICD-10-CM

## 2024-04-03 NOTE — Progress Notes (Signed)
 Virtual visit for tobacco cessation  Patient joined call and immediately asked to reschedule based on a medical emergency. Stated Need to figure out how my life is going to go know.   Offered to assist in rescheduling and patient shared they would prefer to call later to reschedule.  They apologized for being unable to keep appointment.  Patient seen with Fonda Blase, PharmD Candidate - PY3 student and Calton Nash, PharmD Candidate - PY4 student.

## 2024-04-03 NOTE — Patient Instructions (Signed)
 No intervention - unable to meet.  Asked to reschedule.

## 2024-04-03 NOTE — Assessment & Plan Note (Signed)
 Unable to meet virtually plans to reschedule.  Plan follow-up in 3-4 weeks

## 2024-04-03 NOTE — Telephone Encounter (Signed)
 Patient calls nurse line in regards to UC visit.   She reports she was given Metronidazole , Doxycyline and an injectable antibiotic.  She reports she took both oral antibiotics last night and fell asleep with no issues. However, this morning when she took both oral antibiotics she threw up within 10 minutes of taking. She reports she ate breakfast prior to taking the medications.   Patient advised to reach out to urgent care for advisement.   Patient agreed with plan.

## 2024-04-04 NOTE — Progress Notes (Signed)
 Reviewed

## 2024-04-10 ENCOUNTER — Telehealth: Payer: Self-pay

## 2024-04-10 NOTE — Telephone Encounter (Signed)
 Patient calls nurse line reporting vaginal yeast symptoms.   Patient has been on multiple antibiotics since last week. She reports she is currently taking Flagyl  and Doxycycline for PID. She reports she will complete the course on 9/3. See UC notes.  She reports she has more yeast symptoms now. She reports burning and chunky discharge.   No fevers, no chills or abnormal vaginal odors.  She is interested in using monistat, however does not want it to interfere with current antibiotics.   She reports she also reached out to UC for assistance, however they did not call her back.   Will forward to PCP.

## 2024-04-18 ENCOUNTER — Encounter: Payer: Self-pay | Admitting: Family Medicine

## 2024-04-18 ENCOUNTER — Ambulatory Visit: Admitting: Family Medicine

## 2024-04-18 ENCOUNTER — Other Ambulatory Visit (HOSPITAL_COMMUNITY)
Admission: RE | Admit: 2024-04-18 | Discharge: 2024-04-18 | Disposition: A | Discharge status: Home / Self Care | Source: Ambulatory Visit | Attending: Family Medicine | Admitting: Family Medicine

## 2024-04-18 VITALS — BP 128/89 | HR 87 | Wt 174.0 lb

## 2024-04-18 DIAGNOSIS — N730 Acute parametritis and pelvic cellulitis: Secondary | ICD-10-CM | POA: Diagnosis present

## 2024-04-18 DIAGNOSIS — B86 Scabies: Secondary | ICD-10-CM

## 2024-04-18 MED ORDER — PERMETHRIN 5 % EX CREA
1.0000 | TOPICAL_CREAM | Freq: Once | CUTANEOUS | 0 refills | Status: AC
Start: 2024-04-18 — End: 2024-04-18

## 2024-04-18 NOTE — Progress Notes (Signed)
    SUBJECTIVE:   CHIEF COMPLAINT / HPI:   Patient presents for test of cure after antibiotic course for PID diagnosed at urgent care.  They were treated with a course of metronidazole  and doxycycline which they tolerated with minimal nausea and vomiting.  They do report missing 2 doses due to nausea related to these medications.  They deny any further discharge or burning at this time.  Patient also reports new rash irruption which started Sunday night on their shoulder underneath her nicotine  patch, and spread to underneath her breasts as well as down the arms and onto the hands and legs.  It was initially raised and somewhat swollen but is now flat and scaly.  They did take Benadryl yesterday to help with the itching which improved it to a tolerable level, but it is still painful and itchy.  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  Otherwise no sick contacts  PERTINENT  PMH / PSH: None  OBJECTIVE:   BP 128/89   Pulse 87   Wt 174 lb (78.9 kg)   SpO2 100%   BMI 31.83 kg/m   General: A&O, NAD Cardiac: RRR, no m/r/g Respiratory: CTAB, normal WOB, no w/c/r Extremities: NTTP, no peripheral edema.  Rash pictured below.    ASSESSMENT/PLAN:   Assessment & Plan PID (acute pelvic inflammatory disease) - Self swab today for test of cure -Will inform patient of results and treat if necessary Scabies - Scribed permethrin  cream to be applied to the entire body surface once and left in place for 8 hours. -Provided patient's with application instructions including when to rinse what to do if rash does not resolve after 1 week, i.e. reapply permethrin  -Patient will follow-up in 2 weeks to monitor rash resolution/progression - If patient's rash is not fully resolved would prescribe oral course of ivermectin at that time.   Lucie Pinal, DO [facility]

## 2024-04-18 NOTE — Patient Instructions (Signed)
 It was wonderful to see you today!  Your rash is caused by scabies. You will need to use permethrin  cream, one time on your whole body. First thing in the morning, cover yourself from neck to the soles of your feet in permethrin  cream. Keep the cream on for eight full hours, then wash it off in the shower. If it is not better after two weeks, you can repeat the process. You should follow up in the clinic in 2 weeks to see how the rash is doing.   Please call [phone] with any questions about today's appointment.   If you need any additional refills, please call your pharmacy before calling the office.  Lucie Pinal, DO Family Medicine

## 2024-04-22 LAB — CERVICOVAGINAL ANCILLARY ONLY
Bacterial Vaginitis (gardnerella): NEGATIVE
Candida Glabrata: POSITIVE — AB
Candida Vaginitis: POSITIVE — AB
Chlamydia: NEGATIVE
Comment: NEGATIVE
Comment: NEGATIVE
Comment: NEGATIVE
Comment: NEGATIVE
Comment: NEGATIVE
Comment: NORMAL
Neisseria Gonorrhea: NEGATIVE
Trichomonas: NEGATIVE

## 2024-04-23 ENCOUNTER — Ambulatory Visit: Payer: Self-pay | Admitting: Family Medicine

## 2024-04-23 MED ORDER — FLUCONAZOLE 150 MG PO TABS: 150.0000 mg | ORAL_TABLET | Freq: Once | ORAL | 0 refills | Status: AC

## 2024-05-02 ENCOUNTER — Ambulatory Visit: Payer: Self-pay

## 2024-05-05 ENCOUNTER — Telehealth: Payer: Self-pay

## 2024-05-05 NOTE — Telephone Encounter (Signed)
 Patient calls nurse line in regards to Mirapex .  She reports she has been getting this medication through her PSY provider, however has not been following PSY for sometime.   She reports she has been on 1.5mg  up until last week. She reports she decided to start weaning herself off of the medication since she does not have a PSY provider anymore. She reports since last Wednesday 9/17 she has been taking .75mg . She reports she has been cutting the 1.5mg  tab in half.   She reports flu like symptoms and fatigue. She denies any thoughts of self harm or harm to others.   She reports she is aware she needs to see PSY about this, however reports she has no money to do so.   She reports she will be completely out of medication on Wednesday 9/24.  Patient advised she would need to be seen to discuss. She reports she lives in [place] and does not have the money to come in for an actual visit.   Patient scheduled for virtual visit for tomorrow.   Strict ED precautions discussed with patient.

## 2024-05-06 ENCOUNTER — Telehealth (INDEPENDENT_AMBULATORY_CARE_PROVIDER_SITE_OTHER): Admitting: Student

## 2024-05-06 DIAGNOSIS — F333 Major depressive disorder, recurrent, severe with psychotic symptoms: Secondary | ICD-10-CM | POA: Diagnosis not present

## 2024-05-06 MED ORDER — PRAMIPEXOLE DIHYDROCHLORIDE 1 MG PO TABS: 1.0000 mg | ORAL_TABLET | Freq: Every day | ORAL | 0 refills | Status: AC

## 2024-05-06 MED ORDER — ONDANSETRON HCL 4 MG PO TABS: 4.0000 mg | ORAL_TABLET | Freq: Three times a day (TID) | ORAL | 1 refills | Status: AC | PRN

## 2024-05-06 NOTE — Patient Instructions (Signed)
 Pleasure to meet you today.  I have reduced your Mirapex  dose from 1.5 mg to 1 mg given that you are already having withdrawal side effects.  I have also sent in a prescription for Zofran  to help with your nausea.  You can do over-the-counter Imodium  or Metamucil use for your diarrhea.  Please follow-up in 1 week please see how you are doing with the medication adjustments.

## 2024-05-06 NOTE — Progress Notes (Cosign Needed Addendum)
 Virtual Visit via Video Note  I connected with Beth [name] on 05/06/24 at  1:30 PM EDT by a video enabled telemedicine application and verified that I am speaking with the correct person using two identifiers.  Location: Patient: At home Provider: [facility]    I discussed the limitations of evaluation and management by telemedicine and the availability of in person appointments. The patient expressed understanding and agreed to proceed.  History of Present Illness:  33 year old patient with history of MDD, PTSD, alcohol  use disorder IBS Being seen today via virtual visits to discuss weaning off Mirapex  Patient reports being on daily of Mirapex  Previously medication was prescribed and managed by her psychiatrist for inability to feel pleasure However due to deductible and cost unable to continue visit with psychiatrist Patient expressed interest and desire to wean off Mirapex  because it cause compulsivity and hypervigilant About a week ago patient started self weaning down Mirapex  from 1.5 mg to 0.75 mg Per patient a day after initiating weaning started having flulike symptoms. Associated symptoms include fatigue, sweating profusely, nausea, vomiting and diarrhea. Denies being on any illicit drugs at this time but last alcohol  use 4.5 years ago. Other medication include trazodone  use as needed for sleep. Patient has not used Ativan  in over 1 month   Observations/Objective: Anxious appearing, generally fatigued, NAD, good judgment, pleasant affect, ANO x 3  Assessment and Plan:  Unclear if patient is having withdrawal side effects but given symptoms coincides with the onset of self weaning from Mirapex  from 1.5 mg daily to 0.75 mg will return patient to 1mg  and monitor symptoms for a week.  Will also initiate conservative management for symptom relief - Adjusted patient's medication to 1 mg daily - Ordered as needed Zofran  for nausea and vomiting - Recommend over-the-counter Metamucil  or Imodium  for diarrhea - Follow-up in 1 week or earlier as needed.  Follow Up Instructions:  I discussed the assessment and treatment plan with the patient. The patient was provided an opportunity to ask questions and all were answered. The patient agreed with the plan and demonstrated an understanding of the instructions.   The patient was advised to call back or seek an in-person evaluation if the symptoms worsen or if the condition fails to improve as anticipated.  I provided 20 minutes of non-face-to-face time during this encounter.   Norleen April, MD

## 2024-05-28 ENCOUNTER — Other Ambulatory Visit: Payer: Self-pay | Admitting: Student

## 2024-05-28 DIAGNOSIS — F333 Major depressive disorder, recurrent, severe with psychotic symptoms: Secondary | ICD-10-CM

## 2024-06-27 ENCOUNTER — Other Ambulatory Visit (HOSPITAL_COMMUNITY)
Admission: RE | Admit: 2024-06-27 | Discharge: 2024-06-27 | Disposition: A | Discharge status: Home / Self Care | Payer: Self-pay | Source: Ambulatory Visit | Attending: Family Medicine | Admitting: Family Medicine

## 2024-06-27 ENCOUNTER — Ambulatory Visit (INDEPENDENT_AMBULATORY_CARE_PROVIDER_SITE_OTHER): Admitting: Family Medicine

## 2024-06-27 ENCOUNTER — Encounter: Payer: Self-pay | Admitting: Family Medicine

## 2024-06-27 ENCOUNTER — Ambulatory Visit: Payer: Self-pay | Admitting: Family Medicine

## 2024-06-27 VITALS — BP 131/92 | HR 108 | Temp 98.1°F | Ht 61.0 in | Wt 178.6 lb

## 2024-06-27 DIAGNOSIS — N898 Other specified noninflammatory disorders of vagina: Secondary | ICD-10-CM | POA: Insufficient documentation

## 2024-06-27 DIAGNOSIS — R1024 Suprapubic pain: Secondary | ICD-10-CM

## 2024-06-27 DIAGNOSIS — B9689 Other specified bacterial agents as the cause of diseases classified elsewhere: Secondary | ICD-10-CM

## 2024-06-27 LAB — POCT URINE DIPSTICK
Bilirubin, UA: NEGATIVE
Glucose, UA: NEGATIVE mg/dL
Ketones, POC UA: NEGATIVE mg/dL
Leukocytes, UA: NEGATIVE
Nitrite, UA: NEGATIVE
POC PROTEIN,UA: NEGATIVE
Spec Grav, UA: 1.02 (ref 1.010–1.025)
Urobilinogen, UA: 0.2 U/dL
pH, UA: 5.5 (ref 5.0–8.0)

## 2024-06-27 MED ORDER — CEFTRIAXONE SODIUM 250 MG IJ SOLR
250.0000 mg | Freq: Once | INTRAMUSCULAR | Status: AC
  Administered 2024-06-27: 250 mg via INTRAMUSCULAR

## 2024-06-27 MED ORDER — CEFTRIAXONE SODIUM 1 G IJ SOLR: 250.0000 mg | INTRAMUSCULAR | Status: DC

## 2024-06-27 MED ORDER — ONDANSETRON 4 MG PO TBDP
4.0000 mg | ORAL_TABLET | Freq: Once | ORAL | Status: AC
  Administered 2024-06-27: 4 mg via ORAL

## 2024-06-27 MED ORDER — AZITHROMYCIN 250 MG PO TABS
2000.0000 mg | ORAL_TABLET | Freq: Every day | ORAL | Status: AC
  Administered 2024-06-27: 2000 mg via ORAL

## 2024-06-27 NOTE — Progress Notes (Signed)
    SUBJECTIVE:   CHIEF COMPLAINT / HPI:   Camila Maita is a 33 yo who presents for complaints of vaginal discharge and odor which they have had previously concerning for bacterial vaginosis. They report ongoing abdominal pain and discharge on and off for a few months. They have not come to the doctor due to losing insurance, but describe the pain as pretty severe. They have not tried anything for the pain. Also report white/yellow discharge since last week. Patient is currently sexually active, but has low concern for STI. Denies any fevers, chills, dizziness, urinary symptoms.   Previously seen 9/05 and diagnosed with candida vaginitis. Tx with diflucan .   OBJECTIVE:   BP (!) 131/92   Pulse (!) 108   Temp 98.1 F (36.7 C) (Oral)   Ht 5' 1 (1.549 m)   Wt 178 lb 9.6 oz (81 kg)   SpO2 100%   BMI 33.75 kg/m   General: A&O, NAD HEENT: No sign of trauma, EOM grossly intact Respiratory: normal WOB GI: mildly TTP at suprapubic and lower quadrants, soft, nondistended GU: Normal appearance of labia majora and minora, without lesions. Vagina tissue pink, moist, without lesions or abrasions. Copious malodorous white discharge noted at posterior vaginal vault and cervix. Cervix non-friable and without lesions.   ASSESSMENT/PLAN:   Assessment & Plan Vaginal discharge Given patients description of pain along with ongoing history of similar symptoms, do have some concern for PID. Patient afebrile today. STI testing completed today. Will treat with 500mg  ceftriaxone  and 2 mg azithromycin. Urine dipstick ordered to rule out UTI which did not show nitrites, leukocytes or protein. Small blood noted. Discuss return precautions with patient. Discussed treating pain with NSAIDS prn. BP and pulse elevated today, likely 2/2 to pain. Patients vital signs wnl at prior visits.      Gloriann Ogren, MD [facility]

## 2024-06-27 NOTE — Patient Instructions (Signed)
 It was wonderful to see you today.  Please bring ALL of your medications with you to every visit.   Today we talked about:  Today we treated you for a suspected infection with two antibiotics. I will call you with results to your swab. If your symptoms are worsening, you experience fevers or chills, or you are in pain that is not tolerable, please go to the emergency department   Thank you for choosing [facility].   Please call [phone] with any questions about today's appointment.  Please arrive at least 15 minutes prior to your scheduled appointments.   If you had blood work today, I will send you a MyChart message or a letter if results are normal. Otherwise, I will give you a call.   If you had a referral placed, they will call you to set up an appointment. Please give us  a call if you don't hear back in the next 2 weeks.   If you need additional refills before your next appointment, please call your pharmacy first.   Do you need your medications delivered to your home?   We'll send your prescription to the [facility] for delivery.          Address: [address] [place], [place], [place] [address]          Phone: [phone]  Please call the [facility] to speak with a pharmacist and set up your home medication delivery. If you have any questions, feel free to contact us  -- we're happy to help!  Other  Pharmacies that offer affordable prices on both prescriptions and over-the-counter items, as well as convenient services like vaccinations, are  [facility], at [facility]         Address:  [address], [place], [place] [address]         Phone: [phone]  [facility], located in the [facility]        Address: [address] [place], [place] [address]        Phone: [phone]  [facility], at [facility]       Address: [address], [place], [place] [address]       Phone: [phone]  [facility], at [facility]       Address: [address], [place], [place] [address]       Phone: [phone]  You should follow up in our clinic in No follow-ups on file.  Gloriann Ogren, MD Family Medicine

## 2024-06-30 LAB — CERVICOVAGINAL ANCILLARY ONLY
Bacterial Vaginitis (gardnerella): POSITIVE — AB
Candida Glabrata: NEGATIVE
Candida Vaginitis: NEGATIVE
Chlamydia: NEGATIVE
Comment: NEGATIVE
Comment: NEGATIVE
Comment: NEGATIVE
Comment: NEGATIVE
Comment: NEGATIVE
Comment: NORMAL
Neisseria Gonorrhea: NEGATIVE
Trichomonas: NEGATIVE

## 2024-07-01 MED ORDER — METRONIDAZOLE 0.75 % VA GEL: 1.0000 | Freq: Every day | VAGINAL | 0 refills | Status: AC
